# Patient Record
Sex: Male | Born: 1945
Health system: Southern US, Community
[De-identification: ages and names within clinical notes are randomized; demographics above are authoritative.]

## PROBLEM LIST (undated history)

## (undated) DIAGNOSIS — E079 Disorder of thyroid, unspecified: Secondary | ICD-10-CM

## (undated) DIAGNOSIS — T8859XA Other complications of anesthesia, initial encounter: Secondary | ICD-10-CM

## (undated) DIAGNOSIS — N400 Enlarged prostate without lower urinary tract symptoms: Secondary | ICD-10-CM

## (undated) DIAGNOSIS — T4145XA Adverse effect of unspecified anesthetic, initial encounter: Secondary | ICD-10-CM

## (undated) DIAGNOSIS — I2510 Atherosclerotic heart disease of native coronary artery without angina pectoris: Secondary | ICD-10-CM

## (undated) DIAGNOSIS — M1990 Unspecified osteoarthritis, unspecified site: Secondary | ICD-10-CM

## (undated) DIAGNOSIS — I10 Essential (primary) hypertension: Secondary | ICD-10-CM

## (undated) DIAGNOSIS — N529 Male erectile dysfunction, unspecified: Secondary | ICD-10-CM

## (undated) DIAGNOSIS — E039 Hypothyroidism, unspecified: Secondary | ICD-10-CM

## (undated) DIAGNOSIS — I493 Ventricular premature depolarization: Secondary | ICD-10-CM

## (undated) DIAGNOSIS — E785 Hyperlipidemia, unspecified: Secondary | ICD-10-CM

## (undated) HISTORY — DX: Atherosclerotic heart disease of native coronary artery without angina pectoris: I25.10

## (undated) HISTORY — DX: Male erectile dysfunction, unspecified: N52.9

## (undated) HISTORY — DX: Benign prostatic hyperplasia without lower urinary tract symptoms: N40.0

## (undated) HISTORY — PX: WISDOM TOOTH EXTRACTION: SHX21

## (undated) HISTORY — DX: Hyperlipidemia, unspecified: E78.5

## (undated) HISTORY — DX: Essential (primary) hypertension: I10

## (undated) HISTORY — PX: COLONOSCOPY: SHX174

## (undated) HISTORY — PX: KNEE ARTHROSCOPY: SUR90

## (undated) HISTORY — DX: Disorder of thyroid, unspecified: E07.9

## (undated) HISTORY — DX: Ventricular premature depolarization: I49.3

## (undated) HISTORY — DX: Unspecified osteoarthritis, unspecified site: M19.90

---

## 1983-01-02 HISTORY — PX: OTHER SURGICAL HISTORY: SHX169

## 1997-01-01 HISTORY — PX: NECK SURGERY: SHX720

## 1997-10-29 ENCOUNTER — Encounter: Payer: Self-pay | Admitting: Endocrinology

## 1997-10-29 ENCOUNTER — Ambulatory Visit (HOSPITAL_COMMUNITY): Admission: RE | Admit: 1997-10-29 | Discharge: 1997-10-29 | Payer: Self-pay | Admitting: Endocrinology

## 1997-12-15 ENCOUNTER — Ambulatory Visit (HOSPITAL_COMMUNITY): Admission: RE | Admit: 1997-12-15 | Discharge: 1997-12-15 | Payer: Self-pay | Admitting: Neurosurgery

## 1998-06-09 ENCOUNTER — Inpatient Hospital Stay (HOSPITAL_COMMUNITY): Admission: RE | Admit: 1998-06-09 | Discharge: 1998-06-11 | Payer: Self-pay | Admitting: Neurosurgery

## 1998-10-31 ENCOUNTER — Encounter: Admission: RE | Admit: 1998-10-31 | Discharge: 1998-10-31 | Payer: Self-pay | Admitting: Neurosurgery

## 1999-01-31 ENCOUNTER — Encounter: Admission: RE | Admit: 1999-01-31 | Discharge: 1999-01-31 | Payer: Self-pay | Admitting: Neurosurgery

## 1999-08-08 ENCOUNTER — Encounter: Admission: RE | Admit: 1999-08-08 | Discharge: 1999-08-08 | Payer: Self-pay | Admitting: Neurosurgery

## 2003-01-02 HISTORY — PX: REPLACEMENT TOTAL KNEE: SUR1224

## 2003-01-18 ENCOUNTER — Inpatient Hospital Stay (HOSPITAL_COMMUNITY): Admission: RE | Admit: 2003-01-18 | Discharge: 2003-01-21 | Payer: Self-pay | Admitting: Orthopedic Surgery

## 2006-05-09 ENCOUNTER — Emergency Department (HOSPITAL_COMMUNITY): Admission: EM | Admit: 2006-05-09 | Discharge: 2006-05-09 | Payer: Self-pay | Admitting: Emergency Medicine

## 2008-08-26 ENCOUNTER — Inpatient Hospital Stay (HOSPITAL_COMMUNITY): Admission: RE | Admit: 2008-08-26 | Discharge: 2008-08-28 | Payer: Self-pay | Admitting: Neurosurgery

## 2009-05-04 ENCOUNTER — Encounter: Admission: RE | Admit: 2009-05-04 | Discharge: 2009-05-04 | Payer: Self-pay | Admitting: Gastroenterology

## 2010-04-08 LAB — BASIC METABOLIC PANEL
CO2: 27 mEq/L (ref 19–32)
Calcium: 9.6 mg/dL (ref 8.4–10.5)
Creatinine, Ser: 1.34 mg/dL (ref 0.4–1.5)
GFR calc non Af Amer: 54 mL/min — ABNORMAL LOW (ref >60)
Potassium: 4.4 mEq/L (ref 3.5–5.1)
Sodium: 138 mEq/L (ref 135–145)

## 2010-04-08 LAB — CBC
HCT: 43.4 % (ref 39.0–52.0)
Hemoglobin: 14.9 g/dL (ref 13.0–17.0)
Platelets: 206 10*3/uL (ref 150–400)
RDW: 13.6 % (ref 11.5–15.5)

## 2010-05-16 NOTE — Op Note (Signed)
NAME:  Logan Sanders, Logan Sanders NO.:  [id number]   MEDICAL RECORD NO.:  [id number]          PATIENT TYPE:  INP   LOCATION:  3534                         FACILITY:  [facility]   PHYSICIAN:  Cristi Loron, M.D.DATE OF BIRTH:  03/26/1945   DATE OF PROCEDURE:  08/26/2008  DATE OF DISCHARGE:                               OPERATIVE REPORT   BRIEF HISTORY:  The patient is a 65 year old white male who I performed  a C3-4, C4-5, C5-6 anterior cervical discectomy a few years ago.  The  patient has developed a C4-5 pseudoarthrosis, and persistent and  increasing neck pain.  I discussed the various treatment options with  the patient to include surgery.  He has weighed the risks, benefits, and  alternatives of surgery, and decided to proceed with a C3-C6 posterior  cervical instrumentation and fusion.   PREOPERATIVE DIAGNOSIS:  C4-5 pseudoarthrosis.   POSTOPERATIVE DIAGNOSIS:  C4-5 pseudoarthrosis.   PROCEDURE:  C3-4, C4-5, and C5-6 posterior cervical fusion with local  morselized autograft bone, bone morphogenic protein, and Actifuse bone  graft extender; C3-C6 posterior cervical instrumentation with axis  lateral mass titanium screws and rods.   SURGEON:  Cristi Loron, MD   ASSISTANT:  Hilda Lias, MD   ANESTHESIA:  General endotracheal.   ESTIMATED BLOOD LOSS:  75 mL.   SPECIMENS:  None.   DRAINS:  None.   COMPLICATIONS:  None.   DESCRIPTION OF PROCEDURE:  The patient was brought to the operating room  by Anesthesia team.  General endotracheal Anesthesia was induced.  The  [place] three-point headrest was applied to the patient's calvarium.  The patient was then turned to the prone position on the chest rolls.  We confirmed his neutral alignment with fluoroscopy.  The patient's  suboccipital region and posterior neck was then shaved with clippers and  prepared with Betadine scrub and Betadine solution.  Sterile drapes were  applied and the injected area to  be incised with Marcaine with  epinephrine solution.  I used a scalpel to make a linear midline  incision over the posterior cervical region.  I used electrocautery to  perform a bilateral subperiosteal dissection exposing spinous process of  the lamina from C3 down to approximately T1.  We inserted the [facility]  and cerebellar retractor for exposure.  We exposed the facets at C3-4,  C4-5, C5-6 and identified the cephalad and caudal, medial, and lateral  borders.  We used a drill and drill guide to drill a 40mm hole beginning  approximately at the center of the lateral mass aiming in the cephalad  direction (under fluoroscopic guidance) for the screws from C3 and C4.  We then probed inside the drill hole to rule out cortical breaches.  We  then inserted a 3.5- x 40-mm screws on the right C3, C4, C5 and C6 and  on the left C4, C5, and C6. We placed a 4.0 x 14 mm on the left at C3.  We got a good bony purchase at each level.  We then cut a rod to the  appropriate length, bend to appropriate lordosis, and then  connected  unilateral screws with a rod.  We placed caps, tightened appropriately.  This completed the instrumentation.   We now turned our attention to the arthrodesis.  We used high-speed  drill to decorticate the lateral aspect of the lamina as well as the  remainder of the facets, lateral masses from C3-C6.  We then laid a bone  morphogenic protein-soaked collagen sponges over these decorticated  posterolateral structures and then laid local autograft bone.  We  obtained with drilling as well as Actifuse bone graft extenders over  these to decorticate posterolateral masses to complete the post  arthrodesis from C3-C6.  Of note, I should mention that clearly the  patient had pseudarthrosis at C4-5 at the motion segment and facet was  quite mobile and arthritic.  We then obtained hemostasis using bipolar  electrocautery.  We then removed the retractors and reapproximated the   patient's cervicothoracic fascia with interrupted 0 Vicryl suture and  subcutaneous tissue with interrupted 2-0 Vicryl suture and the skin with  Steri-Strips and Benzoin.  The wound was then coated with bacitracin  ointment.  Sterile dressing was applied.  The drapes were removed and  the patient was subsequently returned to supine position where the  [place] three-point headrest was removed and the patient was  subsequently extubated by the Anesthesia team and transported to Post  Anesthesia Care Unit in stable condition.  All sponge, instrument, and  needle counts were correct at the end of this case.      Cristi Loron, M.D.  Electronically Signed     JDJ/MEDQ  D:  08/26/2008  T:  08/27/2008  Job:  [id number]

## 2010-05-19 NOTE — H&P (Signed)
NAME:  Logan Sanders, Logan Sanders NO.:  [id number]   MEDICAL RECORD NO.:  [id number]                   PATIENT TYPE:  INP   LOCATION:                                       FACILITY:  [facility]   PHYSICIAN:  John L. Rendall, M.D.               DATE OF BIRTH:  11-12-1945   DATE OF ADMISSION:  01/18/2003  DATE OF DISCHARGE:                                HISTORY & PHYSICAL   CHIEF COMPLAINT:  Left knee pain.   HISTORY OF PRESENT ILLNESS:  Mr. Allemand is a 65 year old white male with a  long history of left knee pain. Initially he injured his knee during a  football game in college, the year was 54. The patient has had three  surgeries on his knee with good relief of his pain with each surgery. The  last surgery was performed by Dr. Priscille Kluver in 1994 for torn medial meniscus,  however, the patient has had progressively worsening knee pain since that  time. He has undergone multiple cortisone and even underwent a series of  [place] injections with decreasing duration of pain with each injection.  The pain is mostly with standing or ambulation. The pain does not awaken him  at night. Mechanical symptoms include a giving way sensation, however, he  has not sustained any falls. He also has loss of sensation. The patient  takes Vicodin for pain, especially at night, with fair relief as long as he  uses the Vicodin extra strength. The patient uses assistive devices to  ambulate. He will undergo a left total knee arthroplasty on November 17 at  [facility] performed by Dr. Priscille Kluver.   ALLERGIES:  No known drug allergies.   MEDICATIONS:  1. Synthroid 75 mcg daily.  2. Lipitor 10 mg daily.  3. Doxazosin 2 mg daily.  4. Atenolol 50 mg daily.   PAST MEDICAL HISTORY:  1. Bilateral osteoarthritis of the knees left greater than right.  2. Hypertension.  3. Hyperlipidemia.   PAST SURGICAL HISTORY:  1. Left knee surgery in 1969.  2. Lower back surgery in 1983.  3.  Left knee arthroscopy 1986.  4. Left knee arthroscopy by Dr. Priscille Kluver in 1983.  5. Three level fusion of the cervical spine in 2000.   The patient denies blood transfusions with any of the above procedures. He  does mention he has a difficult time waking up from general anesthesia. He  feels as if he is trapped.   SOCIAL HISTORY:  The patient denies any tobacco or alcohol use. He is  married and has two children. The patient lives in a one story home with two  steps to the usual entrance. He is a retired Theatre stage manager.   PRIMARY CARE PHYSICIAN:  Alfonse Alpers. Dagoberto Ligas, M.D., [place], [place]  [place].   FAMILY HISTORY:  The patient's mother is alive at age 20, has asthma and  Parkinson's disease. His  father is alive at age 3, has questionable  hypertension and a history of prostate cancer. Two living siblings with no  known medical conditions.   REVIEW OF SYSTEMS:  The patient denies any chest pain, shortness of breath,  PND, orthopnea. He wears glasses. Has urinary urgency. Otherwise denies any  GI, GU, neurologic, hematologic conditions.   PHYSICAL EXAMINATION:  GENERAL:  The patient is a well-developed, well-  nourished male who walks with a slight limp on the left side. The patient's  mood and affect are appropriate. The patient talks easily with examiner.  VITAL SIGNS:  Height 5 feet 10 inches, weight 220 pounds. Temperature 97.3,  blood pressure 130/80, pulse 60, respirations 16.  HEENT:  Head is normocephalic and atraumatic without frontal or maxillary  sinus tenderness to palpation. Bilateral conjunctivae are pink. Sclerae is  nonicteric bilaterally. PERRLA. EOMs are intact. No visible external ear  deformity noted. TMs are pearly and gray bilaterally. Nose and nasal septum  midline. Nasal mucosa is pink and moist without polyps. Buccal mucosa was  pink and moist. Good dentition. Pharynx is without erythema or exudate.  Tongue and uvula are midline.  NECK:  Supple. Carotids  are 2+ without bruits. Has full range of motion of  the cervical spine with no tenderness with palpation along the cervical  spine.  CARDIAC:  Regular rate and rhythm. No murmurs, rubs, or gallops noted.  ABDOMEN:  Soft, nontender, positive bowel sounds x4 quadrants.  BACK:  Nontender to palpation over the thoracic and lumbar spine.  GENITOURINARY/RECTAL:  Not pertinent at this time.  NEUROLOGIC:  The patient is alert and oriented x3. Cranial nerves 2-12 are  grossly intact. Upper and lower extremity strength testing reveals 5/5  strength bilaterally. Deep tendon reflexes are 2+ bilaterally in the upper  and lower extremities.  MUSCULOSKELETAL:  Upper extremities:  The patient had full range of motion  of the upper extremities. Upper extremities are neuromotorly and vascularly  intact. Radial pulses are 2+ bilaterally. Lower extremities:  Hips full  range of motion bilaterally. Straight leg raise is negative bilaterally.  Right knee full range of motion. __________ reveals no laxity. Otherwise  negative. Passive range of motion revealed some minimal crepitus. Nontender  about the joint line. Left knee:  0 to 115-120 degrees. Forced flexion  causes pain. Positive crepitus with passive range of motion. Lateral  compartment is tender to palpation. Well-healed medial scar on the knee and  a surgical scar is well-healed above the superior pole. Lower extremities  are nonedematous. The dorsal pedal pulses are 2+ bilaterally.   X-RAYS:  X-rays of the left knee show end-stage osteoarthritis of the left  knee. The right knee shows early medial compartment narrowing.   IMPRESSION:  1. End-stage osteoarthritis of the left knee.  2. Moderate osteoarthritis of the right knee.  3. Hypertension.  4. Hyperlipidemia.   PLAN:  The patient is to be admitted to [facility] on November 16, 2002 and undergo a left total knee arthroplasty by Dr. Priscille Kluver at [facility].      Richardean Canal, [name]                       John L. Priscille Kluver, M.D.    Leanora Cover  D:  01/08/2003  T:  01/08/2003  Job:  [id number]

## 2010-05-19 NOTE — Op Note (Signed)
NAME:  Logan Sanders, Logan Sanders                         ACCOUNT NO.:  [id number]   MEDICAL RECORD NO.:  [id number]                   PATIENT TYPE:  INP   LOCATION:  2550                                 FACILITY:  [facility]   PHYSICIAN:  John L. Rendall III, M.D.           DATE OF BIRTH:  [birth year]   DATE OF PROCEDURE:  01/18/2003  DATE OF DISCHARGE:                                 OPERATIVE REPORT   INDICATIONS FOR PROCEDURE:  Chronic medial left knee pain, resistant to  conservative measures for the last several years.   JUSTIFICATION FOR INPATIENT SETTING:  Pain control and therapy.   PREOPERATIVE DIAGNOSIS:  End-stage osteoarthritis, left knee.   POSTOPERATIVE DIAGNOSIS:  End-stage osteoarthritis, left knee.   PROCEDURE:  Left LCS total knee replacement.   SURGEON:  John L. Rendall, M.D.   ASSISTANT:  Legrand Pitts. Duffy, [name]-C.   ANESTHESIA:  General.   PATHOLOGY:  Bone against bone, medial compartment and lateral compartment,  left knee.   DESCRIPTION OF PROCEDURE:  Under general anesthesia, the left knee is  prepared with DuraPrep and draped as a sterile field.  A sterile proximal  thigh tourniquet is applied and wrapped out with an Esmarch, and elevated at  350 mmHg.  A curvilinear incision is made, incorporating a remote medial  parapatellar incision.  This scar is surgically excised.  A medial deep  incision is made, medial parapatellar.  The patella is everted.  The femur  is sized at a large using the tibial guide.  A proximal tibial resection is  carried out.  The cruciates are not spared.  Using the first femoral guide,  an inner condylar drill hole is placed.  Using the second guide, anterior  and posterior flare of the distal femur resected with a balanced 10 mm  flexion gap.  It was necessary to release some of the MCL from the tibia to  balance it medially.  The distal femoral cut device was not immediately  available, and at this point the proximal tibia was prepared with  center peg  hole with pins placed.  The patella was osteotomized and three peg holes  placed, and the remnants of the menisci and cruciates were resected.  At  this point the cut for the distal femur was available, and a distal femoral  cut was made with a balanced 10 mm extension gap.  The recessing guide is  then used.  A trial component of all components then reveals excellent fit  and alignment, a #5 tibia, a 10 mm bearing, and large femur, with a large  patella.  The permanent components were then obtained.  The bony surfaces  are prepared with pulse irrigation.  All components were then cemented in  place.  Once the cement has hardened, the tourniquet is let down at  precisely one hour.  Multiple small vessels are cauterized.  Excess cement  is removed.  A  Hemovac  is inserted.  The knee is then closed in layers with #1 Tycron, #0  and #2-0 Vicryl, and skin clips are applied.  The patient returned to the recovery room in good condition.  A femoral  nerve block was given prior to the case.                                               John L. Dorothyann Gibbs, M.D.    Renato Gails  D:  01/18/2003  T:  01/18/2003  Job:  [id number]

## 2010-05-19 NOTE — Discharge Summary (Signed)
NAME:  Logan Sanders, Logan Sanders                         ACCOUNT NO.:  [id number]   MEDICAL RECORD NO.:  [id number]                   PATIENT TYPE:  INP   LOCATION:  5005                                 FACILITY:  [facility]   PHYSICIAN:  John L. Rendall, M.D.               DATE OF BIRTH:  08/19/1945   DATE OF ADMISSION:  01/18/2003  DATE OF DISCHARGE:  01/21/2003                                 DISCHARGE SUMMARY   ADMISSION DIAGNOSES:  1. End-stage osteoarthritis, left knee.  2. Osteoarthritis, right knee.  3. Hypertension.  4. Hyperlipidemia.   DISCHARGE DIAGNOSES:  1. End-stage osteoarthritis, left knee, status post total knee arthroplasty.  2. Acute blood loss anemia secondary to surgery.  3. Constipation.  4. Osteoarthritis, right knee.  5. Hypertension.  6. Hyperlipidemia.   SURGICAL PROCEDURE:  On January 18, 2003, Logan Sanders underwent a left total  knee arthroplasty by Dr. Jonny Ruiz L. Rendall, assisted by Legrand Pitts. Duffy, [name]-  C.  He had a Depuy tibial tray cemented size 5 with an LCS complete primary  femoral component cemented size large left.  An LCS complete metal backed  patella cemented size large and an LCS complete RP insert size large 10 mm  thickness.   COMPLICATIONS:  None.   CONSULTATIONS:  1. Physical therapy consult January 19, 2003.  2. Occupational therapy consult January 20, 2003.   HISTORY OF PRESENT ILLNESS:  This 65 year old white male patient presented  to Dr. Priscille Kluver with a long history of left knee pain.  He injured his knee  initially while in college.  He has had three surgeries in the past on the  knee and has had good relief until recently.  Since 1994, he has had  progressively worsening left knee pain.  The pain is present with standing  or ambulation.  It does not awaken him at night, but he complains of the  knee giving way at times.  He is requiring Vicodin for pain relief.  He has  failed conservative treatment.  Because of that, he is presenting  for a left  knee replacement.   HOSPITAL COURSE:  Logan Sanders tolerated the surgical procedure well without  immediate postoperative complications.  He was transferred to 5000.  On  postop day #1, he did have some complaints of itching but otherwise was  doing well.  T-max 100.2.  Hemoglobin 11.9, hematocrit 34.1.  Leg was  neurovascularly intact.  He was started on therapy per protocol.   He did well over the next several days.  Pain was well controlled with p.o.  medications.  He progressed quickly with therapy and on January 21, 2003,  was ready for discharge home.   DISCHARGE INSTRUCTIONS:   DIET:  He may resume his pre-hospitalization diet.   DISCHARGE MEDICATIONS:  He may resume his pre-hospitalization medications.  These include:  1. Synthroid 75 mcg p.o. q.a.m.  2. Lipitor 10 mg  p.o. q.a.m.  3  Doxazosin 2 mg p.o. q.a.m.  1. Atenolol 50 mg p.o. q.a.m..  Additional medications at this time include:  1. Arixtra 2.5 mg subcutaneously daily at 8 p.m., last dose January 24, 2003.  On January 24, he is to start 1 baby aspirin a day for one month.  1. OxyContin 10 mg 2 tablets p.o. q.12h. for 7 days, then 1 tablet p.o.     q.12h. for 7 days, then discontinue, #42 with no refill.  2. Percocet 5/325 mg 1 to 2 tablet p.o. q.4h. p.r.n. for pain, #50 with no     refill.  3. Robaxin 500 mg 1 to 2 tablets p.o. q.6h. p.r.n. for spasm, #40 with no     refill.  4. Ambien 10 mg 1/2 to 1 tablet p.o. q.h.s. p.r.n. insomnia, #30 with no     refill.   ACTIVITY:  He can be out of bed, weightbearing as tolerated on the left with  use of the walker.  He is arranged for home CPM and home health PT per  [place].  Please see the blue total knee discharge sheet for further  activity instruction.   WOUND CARE:  He may shower after no drainage from the wound for two days.  Please see the blue total knee discharge sheet for further wound care  instructions.   FOLLOW UP:  He is to follow up  with our office in approximately 10 to 12  days and needs to call [phone] for that appointment.   LABORATORY DATA:  On January 19, 2003, hemoglobin 11.9, hematocrit 34.1.  On  January 19, hemoglobin 10.4, hematocrit 30.5.  On January 20, white count  7.4, hemoglobin 10.2, hematocrit 29.6, platelets 176.   On January 18, his glucose was 173, calcium 8.2.  On January 19, glucose  175, calcium 8.1.  All other laboratory studies were  within normal limits.      [name], [name]                      John L. Priscille Kluver, M.D.    KED/MEDQ  D:  03/01/2003  T:  03/01/2003  Job:  [id number]   cc:   Alfonse Alpers. Dagoberto Ligas, M.D.  [address]  [place]  [place] [address]  Fax: [phone]

## 2011-12-04 ENCOUNTER — Other Ambulatory Visit: Payer: Self-pay | Admitting: Physician Assistant

## 2011-12-04 ENCOUNTER — Ambulatory Visit
Admission: RE | Admit: 2011-12-04 | Discharge: 2011-12-04 | Disposition: A | Discharge status: Home / Self Care | Payer: Medicare Other | Source: Ambulatory Visit | Attending: Physician Assistant | Admitting: Physician Assistant

## 2011-12-04 DIAGNOSIS — R52 Pain, unspecified: Secondary | ICD-10-CM

## 2012-06-03 ENCOUNTER — Ambulatory Visit
Admission: RE | Admit: 2012-06-03 | Discharge: 2012-06-03 | Disposition: A | Discharge status: Home / Self Care | Payer: Medicare Other | Source: Ambulatory Visit | Attending: Family Medicine | Admitting: Family Medicine

## 2012-06-03 ENCOUNTER — Other Ambulatory Visit: Payer: Self-pay | Admitting: Family Medicine

## 2012-06-03 DIAGNOSIS — R52 Pain, unspecified: Secondary | ICD-10-CM

## 2012-07-25 ENCOUNTER — Other Ambulatory Visit: Payer: Self-pay | Admitting: Family Medicine

## 2012-07-25 DIAGNOSIS — N644 Mastodynia: Secondary | ICD-10-CM

## 2012-07-31 ENCOUNTER — Ambulatory Visit
Admission: RE | Admit: 2012-07-31 | Discharge: 2012-07-31 | Disposition: A | Discharge status: Home / Self Care | Payer: Medicare Other | Source: Ambulatory Visit | Attending: Family Medicine | Admitting: Family Medicine

## 2012-07-31 DIAGNOSIS — N644 Mastodynia: Secondary | ICD-10-CM

## 2012-11-21 ENCOUNTER — Ambulatory Visit
Admission: RE | Admit: 2012-11-21 | Discharge: 2012-11-21 | Disposition: A | Discharge status: Home / Self Care | Payer: Medicare HMO | Source: Ambulatory Visit | Attending: Family Medicine | Admitting: Family Medicine

## 2012-11-21 ENCOUNTER — Other Ambulatory Visit: Payer: Self-pay | Admitting: Family Medicine

## 2012-11-21 DIAGNOSIS — R109 Unspecified abdominal pain: Secondary | ICD-10-CM

## 2013-05-27 ENCOUNTER — Other Ambulatory Visit: Payer: Self-pay | Admitting: Family Medicine

## 2013-05-27 ENCOUNTER — Ambulatory Visit
Admission: RE | Admit: 2013-05-27 | Discharge: 2013-05-27 | Disposition: A | Discharge status: Home / Self Care | Payer: Medicare HMO | Source: Ambulatory Visit | Attending: Family Medicine | Admitting: Family Medicine

## 2013-05-27 DIAGNOSIS — S6992XA Unspecified injury of left wrist, hand and finger(s), initial encounter: Secondary | ICD-10-CM

## 2013-10-13 ENCOUNTER — Encounter: Payer: Self-pay | Admitting: Cardiovascular Disease

## 2013-10-13 ENCOUNTER — Ambulatory Visit (INDEPENDENT_AMBULATORY_CARE_PROVIDER_SITE_OTHER): Payer: Medicare HMO | Admitting: Cardiovascular Disease

## 2013-10-13 VITALS — BP 118/70 | HR 57 | Ht 70.0 in | Wt 207.8 lb

## 2013-10-13 DIAGNOSIS — I10 Essential (primary) hypertension: Secondary | ICD-10-CM

## 2013-10-13 DIAGNOSIS — E785 Hyperlipidemia, unspecified: Secondary | ICD-10-CM

## 2013-10-13 NOTE — Assessment & Plan Note (Signed)
Lipids are mildly elevated  - on   A low dose lipitor 10 mg QOD - he had elevated liver enzymes  On daily atorvastatin - dose was reduced.  At this point, I do not think that a stress test is indicated. He has no symptoms and he has a low pre-test probability of having CAD  I have advised him to increase his exercise to 1 hr a day. We will get a coronary calcium score for  Better risk assessment. I will see him in 1 year.

## 2013-10-13 NOTE — Progress Notes (Signed)
Logan Sanders Date of Birth  Jun 21, 1945       [place] [place] [address], [place], [place]  [address]   [place], [place]  [address] [place]   Fax  [phone]     Fax [phone]  Problem List: 1. Hypertension 2. Hyperlipidemia 3. Hypothyroidism  History of Present Illness:  Logan Sanders is a 69 old gentleman with a history of hypertension, hyperlipidemia and a positive family history of cardiac disease. He was referred for possible stress testing given his risk factors.  He has not had any symptoms of CP or dyspnea.  He worked for the [employer] for 33 years - retired 2005.  Currently, he spends his time riding his [place] , plays golf, hunts deer, [place], squirrel, .  Walks about a mile each day.  Does all this without any CP or dyspnea.  Lifts light weights and uses resistance bands.    Grandfather died at age 67, father died at age 33. Uncle died in his 23's    Current Outpatient Prescriptions  Medication Sig Dispense Refill  . aspirin 81 MG tablet Take 81 mg by mouth daily.      Marland Kitchen atorvastatin (LIPITOR) 10 MG tablet Take 10 mg by mouth every other day.       Marland Kitchen co-enzyme Q-10 50 MG capsule Take 100 mg by mouth daily.      Marland Kitchen doxazosin (CARDURA) 2 MG tablet Take 2 mg by mouth daily.       . fexofenadine (ALLEGRA) 180 MG tablet Take 180 mg by mouth daily.      . Fish Oil-Cholecalciferol (FISH OIL + D3) 1000-1000 MG-UNIT CAPS Take 1,200 mg by mouth daily.      . Garlic (GARLIQUE PO) Take by mouth daily.      . multivitamin-iron-minerals-folic acid (CENTRUM) chewable tablet Chew 1 tablet by mouth daily.      Marland Kitchen SYNTHROID 100 MCG tablet Take 100 mcg by mouth daily before breakfast.       . traZODone (DESYREL) 50 MG tablet Take 50 mg by mouth at bedtime.       . valsartan-hydrochlorothiazide (DIOVAN-HCT) 320-25 MG per tablet Take 1 tablet by mouth daily.      Marland Kitchen zolpidem (AMBIEN)  10 MG tablet Take 10 mg by mouth at bedtime.        No current facility-administered medications for this visit.     No Known Allergies  Past Medical History  Diagnosis Date  . Hypertension   . Thyroid disease   . Hyperlipidemia   . BPH (benign prostatic hyperplasia)   . ED (erectile dysfunction)   . Osteoarthritis     Past Surgical History  Procedure Laterality Date  . Replacement total knee  2005    LEFT KNEE  . Neck surgery  1999  . Lower back surgery  1985    History  Smoking status  . Never Smoker   Smokeless tobacco  . Not on file    History  Alcohol Use  . 0.6 oz/week  . 1 Cans of beer per week    Family History  Problem Relation Age of Onset  . Asthma Mother   . Parkinsonism Mother   . Heart disease Father   . Heart attack Father   . Prostate cancer Father   . Hyperlipidemia Brother     Reviw of Systems:  Reviewed  in the HPI.  All other systems are negative.  Physical Exam: Blood pressure 118/70, pulse 57, height 5\' 10"  (1.778 m), weight 207 lb 12.8 oz (94.257 kg). Wt Readings from Last 3 Encounters:  10/13/13 207 lb 12.8 oz (94.257 kg)     General: Well developed, well nourished, in no acute distress.  Head: Normocephalic, atraumatic, sclera non-icteric, mucus membranes are moist,   Neck: Supple. Carotids are 2 + without bruits. No JVD   Lungs: Clear   Heart: RR, normal S1S2  Abdomen: Soft, non-tender, non-distended with normal bowel sounds.  Msk:  Strength and tone are normal   Extremities: No clubbing or cyanosis. No edema.  Distal pedal pulses are 2+ and equal   Neuro: CN II - XII intact.  Alert and oriented X 3.   Psych:  Normal  ECG: Oct. 13, 2015:  Sinus bradycardia at 57.  LAD   Assessment / Plan:

## 2013-10-13 NOTE — Patient Instructions (Signed)
Your physician recommends that you continue on your current medications as directed. Please refer to the Current Medication list given to you today.   DR NAHSER HAS ORDERED FOR YOU TO HAVE A CT CARDIAC SCORE DONE HERE AT OUR OFFICE, FOR SCREENING PURPOSES.    Your physician wants you to follow-up in: [place] will receive a reminder letter in the mail two months in advance. If you don't receive a letter, please call our office to schedule the follow-up appointment.

## 2013-10-13 NOTE — Assessment & Plan Note (Signed)
His BP is well controlled on current medications. Continue .

## 2013-10-27 ENCOUNTER — Ambulatory Visit (INDEPENDENT_AMBULATORY_CARE_PROVIDER_SITE_OTHER)
Admission: RE | Admit: 2013-10-27 | Discharge: 2013-10-27 | Disposition: A | Discharge status: Home / Self Care | Payer: Self-pay | Source: Ambulatory Visit | Attending: Cardiovascular Disease | Admitting: Cardiovascular Disease

## 2013-10-27 DIAGNOSIS — E785 Hyperlipidemia, unspecified: Secondary | ICD-10-CM

## 2013-10-27 DIAGNOSIS — I10 Essential (primary) hypertension: Secondary | ICD-10-CM

## 2013-10-28 ENCOUNTER — Telehealth: Payer: Self-pay | Admitting: Nurse Practitioner

## 2013-10-28 NOTE — Telephone Encounter (Signed)
Per Dr. Acie Fredrickson:  He has coronary calcification in the LM, LAD and LCX distribution. I think a stress myoview would be helpful in assessing his risks. Please schedule  Spoke with patient and reviewed results of CT scan; advised that stress myoview is needed.  I advised patient of instructions from Myocardial Perfusion Study sheet and advised that one of our schedulers would call patient to make appointment.  Patient verbalized understanding and agreement with plan of care

## 2013-10-29 ENCOUNTER — Other Ambulatory Visit: Payer: Self-pay | Admitting: Nurse Practitioner

## 2013-10-29 ENCOUNTER — Telehealth: Payer: Self-pay | Admitting: Cardiovascular Disease

## 2013-10-29 DIAGNOSIS — I25810 Atherosclerosis of coronary artery bypass graft(s) without angina pectoris: Secondary | ICD-10-CM

## 2013-10-29 NOTE — Telephone Encounter (Signed)
New Message      Pt calling stating he was told he needs to have an exercise tolerance test

## 2013-11-02 ENCOUNTER — Ambulatory Visit (HOSPITAL_COMMUNITY): Payer: Medicare HMO | Attending: Cardiovascular Disease | Admitting: Radiology

## 2013-11-02 DIAGNOSIS — I25810 Atherosclerosis of coronary artery bypass graft(s) without angina pectoris: Secondary | ICD-10-CM

## 2013-11-02 DIAGNOSIS — R0609 Other forms of dyspnea: Secondary | ICD-10-CM | POA: Insufficient documentation

## 2013-11-02 DIAGNOSIS — R9430 Abnormal result of cardiovascular function study, unspecified: Secondary | ICD-10-CM

## 2013-11-02 DIAGNOSIS — E785 Hyperlipidemia, unspecified: Secondary | ICD-10-CM | POA: Diagnosis not present

## 2013-11-02 DIAGNOSIS — I10 Essential (primary) hypertension: Secondary | ICD-10-CM | POA: Insufficient documentation

## 2013-11-02 MED ORDER — TECHNETIUM TC 99M SESTAMIBI GENERIC - CARDIOLITE
33.0000 | Freq: Once | INTRAVENOUS | Status: AC | PRN
  Administered 2013-11-02: 33 via INTRAVENOUS

## 2013-11-02 MED ORDER — TECHNETIUM TC 99M SESTAMIBI GENERIC - CARDIOLITE
11.0000 | Freq: Once | INTRAVENOUS | Status: AC | PRN
  Administered 2013-11-02: 11 via INTRAVENOUS

## 2013-11-02 NOTE — Progress Notes (Signed)
[facility] NUCLEAR [facility] [address] [place],  [address] [phone]    Cardiology Nuclear Med Study  Logan Sanders is a 68 y.o. male     MRN : [id number]     DOB: [birth year]  Procedure Date: 11/02/2013  Nuclear Med Background Indication for Stress Test:  Evaluation for Ischemia, and 10-27-2013 Cardiac CT: Coronary Calcification LM, LAD, and LCX History:  No known CAD Cardiac Risk Factors: Hypertension and Lipids  Symptoms:  DOE   Nuclear Pre-Procedure Caffeine/Decaff Intake:  None> 12 hrs NPO After: 6:30pm   Lungs:  clear O2 Sat: 97% on room air. IV 0.9% NS with Angio Cath:  22g  IV Site: R Antecubital x 1, tolerated well IV Started by:  Irven Baltimore, RN  Chest Size (in):  44 Cup Size: n/a  Height: 5\' 10"  (1.778 m)  Weight:  205 lb (92.987 kg)  BMI:  Body mass index is 29.41 kg/(m^2). Tech Comments:  Patient took Diovan-Hct this am. Irven Baltimore, RN    Nuclear Med Study 1 or 2 day study: 1 day  Stress Test Type:  Stress  Reading MD: N/A  Order Authorizing Provider:  Mertie Moores, MD  Resting Radionuclide: Technetium 81m Sestamibi  Resting Radionuclide Dose: 11.0 mCi   Stress Radionuclide:  Technetium 59m Sestamibi  Stress Radionuclide Dose: 33.0 mCi           Stress Protocol Rest HR: 63 Stress HR: 148  Rest BP: 144/82 Stress BP: 199/64  Exercise Time (min): 7:30 METS: 9.3           Dose of Adenosine (mg):  n/a Dose of Lexiscan: n/a mg  Dose of Atropine (mg): n/a Dose of Dobutamine: n/a mcg/kg/min (at max HR)  Stress Test Technologist: Glade Lloyd, BS-ES  Nuclear Technologist:  Earl Many, CNMT     Rest Procedure:  Myocardial perfusion imaging was performed at rest 45 minutes following the intravenous administration of Technetium 7m Sestamibi. Rest ECG: NSR - Normal EKG  Stress Procedure:  The patient exercised on the treadmill utilizing the Bruce Protocol for 7:30 minutes. The patient stopped due to SOB and denied any  chest pain.  Technetium 84m Sestamibi was injected at peak exercise and myocardial perfusion imaging was performed after a brief delay. Stress ECG: No significant change from baseline ECG  QPS Raw Data Images:  Normal; no motion artifact; normal heart/lung ratio. Stress Images:  there is mildly reduced tracer uptake in a moderate sized area of the lateral wall Rest Images:  Normal homogeneous uptake in all areas of the myocardium. Subtraction (SDS):  These findings are consistent with ischemia. Transient Ischemic Dilatation (Normal <1.22):  0.86 Lung/Heart Ratio (Normal <0.45):  0.36  Quantitative Gated Spect Images QGS EDV:  93 ml QGS ESV:  49 ml  Impression Exercise Capacity:  Fair exercise capacity. BP Response:  Hypertensive blood pressure response. Clinical Symptoms:  There is dyspnea. ECG Impression:  No significant ST segment change suggestive of ischemia. Comparison with Prior Nuclear Study: No previous nuclear study performed  Overall Impression:  Intermediate risk stress nuclear study with mild lateral wall ischemia and mildly depressed LV systolic function.  LV Ejection Fraction: 47%.  LV Wall Motion:  NL LV Function; NL Wall Motion   Sanda Klein, MD, [facility] [phone] office [phone] pager

## 2013-11-03 ENCOUNTER — Telehealth: Payer: Self-pay | Admitting: Nurse Practitioner

## 2013-11-03 NOTE — Telephone Encounter (Signed)
Received call from patient and advised him of positive stress test results and need for cardiac cath.  Patient verbalized understanding and would like to see Dr. Acie Fredrickson in person to discuss.  Patient states he cannot go to [place] office today, scheduled for Thursday 11/5 at 7:45a.  Patient verbalized understanding and agreement with plan of care.

## 2013-11-03 NOTE — Telephone Encounter (Signed)
The stress test is positive.     Despite his lack of symptoms, he may need a cath    I have openings in my [place] schedule today    Received above message from Dr. Acie Fredrickson.  Left message for patient to call me at the office.

## 2013-11-05 ENCOUNTER — Encounter: Payer: Self-pay | Admitting: Cardiovascular Disease

## 2013-11-05 ENCOUNTER — Ambulatory Visit (INDEPENDENT_AMBULATORY_CARE_PROVIDER_SITE_OTHER): Payer: Medicare HMO | Admitting: Cardiovascular Disease

## 2013-11-05 ENCOUNTER — Encounter: Payer: Self-pay | Admitting: Nurse Practitioner

## 2013-11-05 VITALS — BP 130/78 | HR 65 | Ht 70.0 in | Wt 209.4 lb

## 2013-11-05 DIAGNOSIS — Z5181 Encounter for therapeutic drug level monitoring: Secondary | ICD-10-CM

## 2013-11-05 DIAGNOSIS — R9439 Abnormal result of other cardiovascular function study: Secondary | ICD-10-CM | POA: Insufficient documentation

## 2013-11-05 LAB — CBC WITH DIFFERENTIAL/PLATELET
BASOS ABS: 0 10*3/uL (ref 0.0–0.1)
Basophils Relative: 0.5 % (ref 0.0–3.0)
Eosinophils Absolute: 0.4 10*3/uL (ref 0.0–0.7)
Eosinophils Relative: 4.6 % (ref 0.0–5.0)
HEMATOCRIT: 44.1 % (ref 39.0–52.0)
Hemoglobin: 14.5 g/dL (ref 13.0–17.0)
Lymphocytes Relative: 19.5 % (ref 12.0–46.0)
Lymphs Abs: 1.6 10*3/uL (ref 0.7–4.0)
MCHC: 32.9 g/dL (ref 30.0–36.0)
MCV: 87.4 fl (ref 78.0–100.0)
MONO ABS: 0.8 10*3/uL (ref 0.1–1.0)
MONOS PCT: 10.2 % (ref 3.0–12.0)
Neutro Abs: 5.4 10*3/uL (ref 1.4–7.7)
Neutrophils Relative %: 65.2 % (ref 43.0–77.0)
PLATELETS: 234 10*3/uL (ref 150.0–400.0)
RBC: 5.05 Mil/uL (ref 4.22–5.81)
RDW: 14.8 % (ref 11.5–15.5)
WBC: 8.2 10*3/uL (ref 4.0–10.5)

## 2013-11-05 LAB — BASIC METABOLIC PANEL
BUN: 23 mg/dL (ref 6–23)
CALCIUM: 9.2 mg/dL (ref 8.4–10.5)
CO2: 25 meq/L (ref 19–32)
Chloride: 104 mEq/L (ref 96–112)
Creatinine, Ser: 1.8 mg/dL — ABNORMAL HIGH (ref 0.4–1.5)
GFR: 39.7 mL/min — AB (ref >60.00)
Glucose, Bld: 92 mg/dL (ref 70–99)
Potassium: 4 mEq/L (ref 3.5–5.1)
Sodium: 138 mEq/L (ref 135–145)

## 2013-11-05 LAB — PROTIME-INR
INR: 1 ratio (ref 0.8–1.0)
PROTHROMBIN TIME: 10.6 s (ref 9.6–13.1)

## 2013-11-05 NOTE — Progress Notes (Signed)
Logan Sanders Date of Birth  09-24-1945       [facility] [address], [place], [place]  [address]   [place], [place]  [address] [place]   Fax  [phone]     Fax [phone]  Problem List: 1. Hypertension 2. Hyperlipidemia 3. Hypothyroidism  History of Present Illness:  Logan Sanders is a 68 old gentleman with a history of hypertension, hyperlipidemia and a positive family history of cardiac disease. He was referred for possible stress testing given his risk factors.  He has not had any symptoms of CP or dyspnea.  He worked for the [employer] for 33 years - retired 2005.  Currently, he spends his time riding his Selinda Eon , plays golf, hunts deer, [place], squirrel, .  Walks about a mile each day.  Does all this without any CP or dyspnea.  Lifts light weights and uses resistance bands.    Grandfather died at age 87, father died at age 42. Uncle died in his 41's   18-Nov-2013:  Logan Sanders presents today for followup. We performed a coronary calcium score which revealed significant calcification: Dense calcification distal LM Scattered calcification of mid and distal LAD and mid and distal RCA  Subsequent stress myoview was abnormal Overall Impression: Intermediate risk stress nuclear study with mild lateral wall ischemia and mildly depressed LV systolic function.  LV Ejection Fraction: 47%. LV Wall Motion: NL LV Function; NL Wall Motion  Current Outpatient Prescriptions  Medication Sig Dispense Refill  . aspirin 81 MG tablet Take 81 mg by mouth daily.    Marland Kitchen atorvastatin (LIPITOR) 10 MG tablet Take 10 mg by mouth every other day.     Marland Kitchen co-enzyme Q-10 50 MG capsule Take 100 mg by mouth daily.    Marland Kitchen doxazosin (CARDURA) 2 MG tablet Take 2 mg by mouth daily.     . fexofenadine (ALLEGRA) 180 MG tablet Take 180 mg by mouth daily.    . Fish Oil-Cholecalciferol (FISH OIL + D3)  1000-1000 MG-UNIT CAPS Take 1,200 mg by mouth daily.    . Garlic (GARLIQUE PO) Take by mouth daily.    . multivitamin-iron-minerals-folic acid (CENTRUM) chewable tablet Chew 1 tablet by mouth daily.    Marland Kitchen SYNTHROID 100 MCG tablet Take 100 mcg by mouth daily before breakfast.     . traZODone (DESYREL) 50 MG tablet Take 50 mg by mouth at bedtime.     . valsartan-hydrochlorothiazide (DIOVAN-HCT) 320-25 MG per tablet Take 1 tablet by mouth daily.    Marland Kitchen zolpidem (AMBIEN) 10 MG tablet Take 10 mg by mouth at bedtime.      No current facility-administered medications for this visit.     No Known Allergies  Past Medical History  Diagnosis Date  . Hypertension   . Thyroid disease   . Hyperlipidemia   . BPH (benign prostatic hyperplasia)   . ED (erectile dysfunction)   . Osteoarthritis     Past Surgical History  Procedure Laterality Date  . Replacement total knee  2005    LEFT KNEE  . Neck surgery  1999  . Lower back surgery  1985    History  Smoking status  . Former Smoker  . Quit date: 05/06/1999  Smokeless tobacco  . Not on file    History  Alcohol Use  . 0.6 oz/week  . 1 Cans of beer per  week    Family History  Problem Relation Age of Onset  . Asthma Mother   . Parkinsonism Mother   . Heart disease Father   . Heart attack Father   . Prostate cancer Father   . Hyperlipidemia Brother     Reviw of Systems:  Reviewed in the HPI.  All other systems are negative.  Physical Exam: Blood pressure 130/78, pulse 65, height 5\' 10"  (1.778 m), weight 209 lb 6.4 oz (94.983 kg). Wt Readings from Last 3 Encounters:  11/05/13 209 lb 6.4 oz (94.983 kg)  11/02/13 205 lb (92.987 kg)  10/13/13 207 lb 12.8 oz (94.257 kg)     General: Well developed, well nourished, in no acute distress.  Head: Normocephalic, atraumatic, sclera non-icteric, mucus membranes are moist,   Neck: Supple. Carotids are 2 + without bruits. No JVD   Lungs: Clear   Heart: RR, normal S1S2  Abdomen:  Soft, non-tender, non-distended with normal bowel sounds.  Msk:  Strength and tone are normal   Extremities: No clubbing or cyanosis. No edema.  Distal pedal pulses are 2+ and equal   Neuro: CN II - XII intact.  Alert and oriented X 3.   Psych:  Normal  ECG: Oct. 13, 2015:  Sinus bradycardia at 57.  LAD   Assessment / Plan:

## 2013-11-05 NOTE — Assessment & Plan Note (Signed)
Logan Sanders has several risk factors for coronary artery disease including hypertension and hyperlipidemia. He has a family history of coronary artery disease. Performed a coronary calcium score which revealed fairly significant calcifications of his left main and LAD distribution. We performed a stress Myoview study which revealed lateral ischemia and a mildly reduced  LV function with an ejection fraction of 47%.  He does not have any symptoms. Does have some atypical episodes of chest twinges.  We had long discussion with the patient. I do think that we should proceed with a diagnostic cardiac catheterization to make of that he doesn't have critical left main and LAD disease. If he hasn't moderate disease and I think we should continue with medical therapy. I told him that would not necessarily think that he would get an angioplasty or stent following this procedure.  Has hyperlipidemia and has been on Lipitor every other day. We'll have him take his atorvastatin 10 mg every day.  We have discussed the risks , benefits  , options.  He understands  And agrees to proceed.

## 2013-11-05 NOTE — Patient Instructions (Addendum)
Your physician has requested that you have a cardiac catheterization. Cardiac catheterization is used to diagnose and/or treat various heart conditions. Doctors may recommend this procedure for a number of different reasons. The most common reason is to evaluate chest pain. Chest pain can be a symptom of coronary artery disease (CAD), and cardiac catheterization can show whether plaque is narrowing or blocking your heart's arteries. This procedure is also used to evaluate the valves, as well as measure the blood flow and oxygen levels in different parts of your heart. For further information please visit [email or web address]. Please follow instruction sheet, as given.  Your physician has recommended you make the following change in your medication:  TAKE Atorvastatin 10 mg EVERY day  Your physician recommends that you schedule a follow-up appointment in: 2-3 months with Dr. Acie Fredrickson.

## 2013-11-06 ENCOUNTER — Telehealth: Payer: Self-pay | Admitting: *Deleted

## 2013-11-06 DIAGNOSIS — R7989 Other specified abnormal findings of blood chemistry: Secondary | ICD-10-CM

## 2013-11-06 NOTE — Telephone Encounter (Signed)
I spoke with pt & he is aware of his lab results & will hydrate over the weekend. He will not take his Diovan/HCTZ on Sunday.  Orders for repeat bmp placed for the hospital. Horton Chin RN

## 2013-11-06 NOTE — Telephone Encounter (Signed)
-----   Message from Thayer Headings, MD sent at 11/06/2013  2:48 PM EST ----- Creatinine is 1.8 - he is on Diovan / HCTZ He needs to drink lots of fluids over the week.  hold Diovan / HCTZ on Sunday . Will need to recheck BMP on Monday AM prior to cath

## 2013-11-09 ENCOUNTER — Encounter (HOSPITAL_COMMUNITY)
Admission: RE | Disposition: A | Discharge status: Home / Self Care | Payer: Self-pay | Source: Ambulatory Visit | Attending: Cardiovascular Disease

## 2013-11-09 ENCOUNTER — Telehealth: Payer: Self-pay | Admitting: Cardiovascular Disease

## 2013-11-09 ENCOUNTER — Ambulatory Visit (HOSPITAL_COMMUNITY)
Admission: RE | Admit: 2013-11-09 | Discharge: 2013-11-09 | Disposition: A | Discharge status: Home / Self Care | Payer: Medicare HMO | Source: Ambulatory Visit | Attending: Cardiovascular Disease | Admitting: Cardiovascular Disease

## 2013-11-09 DIAGNOSIS — Z8249 Family history of ischemic heart disease and other diseases of the circulatory system: Secondary | ICD-10-CM | POA: Diagnosis not present

## 2013-11-09 DIAGNOSIS — E785 Hyperlipidemia, unspecified: Secondary | ICD-10-CM | POA: Diagnosis not present

## 2013-11-09 DIAGNOSIS — I2510 Atherosclerotic heart disease of native coronary artery without angina pectoris: Secondary | ICD-10-CM

## 2013-11-09 DIAGNOSIS — I2584 Coronary atherosclerosis due to calcified coronary lesion: Secondary | ICD-10-CM | POA: Insufficient documentation

## 2013-11-09 DIAGNOSIS — I10 Essential (primary) hypertension: Secondary | ICD-10-CM | POA: Insufficient documentation

## 2013-11-09 DIAGNOSIS — E039 Hypothyroidism, unspecified: Secondary | ICD-10-CM | POA: Insufficient documentation

## 2013-11-09 DIAGNOSIS — Z87891 Personal history of nicotine dependence: Secondary | ICD-10-CM | POA: Insufficient documentation

## 2013-11-09 DIAGNOSIS — R9439 Abnormal result of other cardiovascular function study: Secondary | ICD-10-CM | POA: Diagnosis present

## 2013-11-09 HISTORY — PX: LEFT HEART CATHETERIZATION WITH CORONARY ANGIOGRAM: SHX5451

## 2013-11-09 HISTORY — PX: CARDIAC CATHETERIZATION: SHX172

## 2013-11-09 LAB — BASIC METABOLIC PANEL
Anion gap: 11 (ref 5–15)
BUN: 21 mg/dL (ref 6–23)
CALCIUM: 9.4 mg/dL (ref 8.4–10.5)
CO2: 26 meq/L (ref 19–32)
Chloride: 104 mEq/L (ref 96–112)
Creatinine, Ser: 1.39 mg/dL — ABNORMAL HIGH (ref 0.50–1.35)
GFR calc Af Amer: 59 mL/min — ABNORMAL LOW (ref >90)
GFR calc non Af Amer: 51 mL/min — ABNORMAL LOW (ref >90)
GLUCOSE: 104 mg/dL — AB (ref 70–99)
Potassium: 4.4 mEq/L (ref 3.7–5.3)
SODIUM: 141 meq/L (ref 137–147)

## 2013-11-09 SURGERY — LEFT HEART CATHETERIZATION WITH CORONARY ANGIOGRAM
Anesthesia: LOCAL

## 2013-11-09 MED ORDER — HEPARIN SODIUM (PORCINE) 1000 UNIT/ML IJ SOLN
INTRAMUSCULAR | Status: AC
  Filled 2013-11-09: qty 1

## 2013-11-09 MED ORDER — SODIUM CHLORIDE 0.9 % IJ SOLN: 3.0000 mL | Freq: Two times a day (BID) | INTRAMUSCULAR | Status: DC

## 2013-11-09 MED ORDER — ASPIRIN 81 MG PO CHEW: 81.0000 mg | CHEWABLE_TABLET | ORAL | Status: DC

## 2013-11-09 MED ORDER — ACETAMINOPHEN 325 MG PO TABS: 650.0000 mg | ORAL_TABLET | ORAL | Status: DC | PRN

## 2013-11-09 MED ORDER — SODIUM CHLORIDE 0.9 % IV SOLN
INTRAVENOUS | Status: DC
  Administered 2013-11-09: 10:00:00 via INTRAVENOUS

## 2013-11-09 MED ORDER — ONDANSETRON HCL 4 MG/2ML IJ SOLN: 4.0000 mg | Freq: Four times a day (QID) | INTRAMUSCULAR | Status: DC | PRN

## 2013-11-09 MED ORDER — SODIUM CHLORIDE 0.9 % IJ SOLN
3.0000 mL | INTRAMUSCULAR | Status: DC | PRN
Start: 2013-11-09 — End: 2013-11-09

## 2013-11-09 MED ORDER — NITROGLYCERIN 1 MG/10 ML FOR IR/CATH LAB
INTRA_ARTERIAL | Status: AC
  Filled 2013-11-09: qty 10

## 2013-11-09 MED ORDER — SODIUM CHLORIDE 0.9 % IV SOLN
250.0000 mL | INTRAVENOUS | Status: DC | PRN
Start: 2013-11-09 — End: 2013-11-09

## 2013-11-09 MED ORDER — LIDOCAINE HCL (PF) 1 % IJ SOLN
INTRAMUSCULAR | Status: AC
  Filled 2013-11-09: qty 30

## 2013-11-09 MED ORDER — VERAPAMIL HCL 2.5 MG/ML IV SOLN
INTRAVENOUS | Status: AC
  Filled 2013-11-09: qty 2

## 2013-11-09 MED ORDER — SODIUM CHLORIDE 0.9 % IV SOLN: 1.0000 mL/kg/h | INTRAVENOUS | Status: DC

## 2013-11-09 MED ORDER — MIDAZOLAM HCL 2 MG/2ML IJ SOLN
INTRAMUSCULAR | Status: AC
  Filled 2013-11-09: qty 2

## 2013-11-09 MED ORDER — HEPARIN (PORCINE) IN NACL 2-0.9 UNIT/ML-% IJ SOLN
INTRAMUSCULAR | Status: AC
  Filled 2013-11-09: qty 2000

## 2013-11-09 MED ORDER — FENTANYL CITRATE 0.05 MG/ML IJ SOLN
INTRAMUSCULAR | Status: AC
  Filled 2013-11-09: qty 2

## 2013-11-09 NOTE — Telephone Encounter (Signed)
Attempted to call patient. Got his voicemailbox. Left message to call tomorrow and schedule appt for approx 2 weeks.

## 2013-11-09 NOTE — H&P (View-Only) (Signed)
Logan Sanders Date of Birth  1945/04/05       [facility] [address], [place], [place]  [address]   [place], [place]  [address] [place]   Fax  [phone]     Fax [phone]  Problem List: 1. Hypertension 2. Hyperlipidemia 3. Hypothyroidism  History of Present Illness:  Logan Sanders is a 68 old gentleman with a history of hypertension, hyperlipidemia and a positive family history of cardiac disease. He was referred for possible stress testing given his risk factors.  He has not had any symptoms of CP or dyspnea.  He worked for the [employer] for 33 years - retired 2005.  Currently, he spends his time riding his [place] Eon , plays golf, hunts deer, [place], squirrel, .  Walks about a mile each day.  Does all this without any CP or dyspnea.  Lifts light weights and uses resistance bands.    Grandfather died at age 57, father died at age 62. Uncle died in his 60's   2013-12-03:  Logan Sanders presents today for followup. We performed a coronary calcium score which revealed significant calcification: Dense calcification distal LM Scattered calcification of mid and distal LAD and mid and distal RCA  Subsequent stress myoview was abnormal Overall Impression: Intermediate risk stress nuclear study with mild lateral wall ischemia and mildly depressed LV systolic function.  LV Ejection Fraction: 47%. LV Wall Motion: NL LV Function; NL Wall Motion  Current Outpatient Prescriptions  Medication Sig Dispense Refill  . aspirin 81 MG tablet Take 81 mg by mouth daily.    Marland Kitchen atorvastatin (LIPITOR) 10 MG tablet Take 10 mg by mouth every other day.     Marland Kitchen co-enzyme Q-10 50 MG capsule Take 100 mg by mouth daily.    Marland Kitchen doxazosin (CARDURA) 2 MG tablet Take 2 mg by mouth daily.     . fexofenadine (ALLEGRA) 180 MG tablet Take 180 mg by mouth daily.    . Fish Oil-Cholecalciferol (FISH OIL + D3)  1000-1000 MG-UNIT CAPS Take 1,200 mg by mouth daily.    . Garlic (GARLIQUE PO) Take by mouth daily.    . multivitamin-iron-minerals-folic acid (CENTRUM) chewable tablet Chew 1 tablet by mouth daily.    Marland Kitchen SYNTHROID 100 MCG tablet Take 100 mcg by mouth daily before breakfast.     . traZODone (DESYREL) 50 MG tablet Take 50 mg by mouth at bedtime.     . valsartan-hydrochlorothiazide (DIOVAN-HCT) 320-25 MG per tablet Take 1 tablet by mouth daily.    Marland Kitchen zolpidem (AMBIEN) 10 MG tablet Take 10 mg by mouth at bedtime.      No current facility-administered medications for this visit.     No Known Allergies  Past Medical History  Diagnosis Date  . Hypertension   . Thyroid disease   . Hyperlipidemia   . BPH (benign prostatic hyperplasia)   . ED (erectile dysfunction)   . Osteoarthritis     Past Surgical History  Procedure Laterality Date  . Replacement total knee  2005    LEFT KNEE  . Neck surgery  1999  . Lower back surgery  1985    History  Smoking status  . Former Smoker  . Quit date: 05/06/1999  Smokeless tobacco  . Not on file    History  Alcohol Use  . 0.6 oz/week  . 1 Cans of beer per  week    Family History  Problem Relation Age of Onset  . Asthma Mother   . Parkinsonism Mother   . Heart disease Father   . Heart attack Father   . Prostate cancer Father   . Hyperlipidemia Brother     Reviw of Systems:  Reviewed in the HPI.  All other systems are negative.  Physical Exam: Blood pressure 130/78, pulse 65, height 5\' 10"  (1.778 m), weight 209 lb 6.4 oz (94.983 kg). Wt Readings from Last 3 Encounters:  11/05/13 209 lb 6.4 oz (94.983 kg)  11/02/13 205 lb (92.987 kg)  10/13/13 207 lb 12.8 oz (94.257 kg)     General: Well developed, well nourished, in no acute distress.  Head: Normocephalic, atraumatic, sclera non-icteric, mucus membranes are moist,   Neck: Supple. Carotids are 2 + without bruits. No JVD   Lungs: Clear   Heart: RR, normal S1S2  Abdomen:  Soft, non-tender, non-distended with normal bowel sounds.  Msk:  Strength and tone are normal   Extremities: No clubbing or cyanosis. No edema.  Distal pedal pulses are 2+ and equal   Neuro: CN II - XII intact.  Alert and oriented X 3.   Psych:  Normal  ECG: Oct. 13, 2015:  Sinus bradycardia at 57.  LAD   Assessment / Plan:

## 2013-11-09 NOTE — Discharge Instructions (Signed)

## 2013-11-09 NOTE — CV Procedure (Signed)
    Cardiac Catheterization Procedure Note  Name: Logan Sanders MRN: [id number] DOB: 08-29-1945  Procedure: Left Heart Cath, Selective Coronary Angiography, LV angiography  Indication: abnormal Myoview scan   Procedural Details: The right wrist was prepped, draped, and anesthetized with 1% lidocaine. Using the modified Seldinger technique, a 5/6 French Slender sheath was introduced into the right radial artery. 3 mg of verapamil was administered through the sheath, weight-based unfractionated heparin was administered intravenously. Standard Judkins catheters were used for selective coronary angiography and left ventriculography. Catheter exchanges were performed over an exchange length guidewire. There were no immediate procedural complications. A TR band was used for radial hemostasis at the completion of the procedure.  The patient was transferred to the post catheterization recovery area for further monitoring.  Procedural Findings: Hemodynamics: AO 133/61 LV 132/12  Coronary angiography: Coronary dominance: right  Left mainstem: the left mainstem has moderate calcification. The vessel has 20-30% stenosis in its midportion. The ostium of the left main is widely patent. The distal left main is widely patent. The stenosis in the left mainstem appears very mild in severity. The vessel divides into the LAD and left circumflex.  Left anterior descending (LAD): the LAD is medium in caliber. The vessel reaches the LV apex. The proximal vessel has diffuse irregularity with mild calcification. The first diagonal has 30-40% stenosis at its ostium. The mid LAD at that bifurcation also has 30-40% stenosis. The remaining portions of the LAD have mild nonobstructive disease.  Left circumflex (LCx): the left circumflex is patent. The mid vessel has 40% stenosis. There is a tiny obtuse marginal branch that has 50% stenosis. The major OM branch is widely patent with minor nonobstructive disease.  Right  coronary artery (RCA): this is a dominant vessel. There is diffuse calcification and irregularity but there are no high-grade stenoses present. There is no more than about 20 or 30% stenosis which is most significant at the junction of the mid and distal RCA. The PDA and PLA branches are patent.  Left ventriculography: Left ventricular systolic function is normal, LVEF is estimated at 55%, there is no significant mitral regurgitation   Estimated Blood Loss: minimal  Final Conclusions:   1. Diffusely calcified coronary arteries with only mild nonobstructive CAD 2. Normal left ventricular systolic function with normal LVEDP  Recommendations: medical management of nonobstructive CAD.  Sherren Mocha MD, [facility] 11/09/2013, 12:26 PM

## 2013-11-09 NOTE — Telephone Encounter (Signed)
New Message        Pt calling stating he had a cath done by Dr. Burt Knack today. Pt has appt w/ Dr. Acie Fredrickson scheduled for 01/13/14 and wants to know if he needs to be seen sooner by Dr. Acie Fredrickson to f/u after the cath or if any of his medications need to be changed. Please call pt back and advise.

## 2013-11-09 NOTE — Interval H&P Note (Signed)
History and Physical Interval Note:  11/09/2013 11:57 AM  Logan Sanders  has presented today for surgery, with the diagnosis of abnormal stress  The various methods of treatment have been discussed with the patient and family. After consideration of risks, benefits and other options for treatment, the patient has consented to  Procedure(s): LEFT HEART CATHETERIZATION WITH CORONARY ANGIOGRAM (N/A) as a surgical intervention .  The patient's history has been reviewed, patient examined, no change in status, stable for surgery.  I have reviewed the patient's chart and labs.  Questions were answered to the patient's satisfaction.    Cath Lab Visit (complete for each Cath Lab visit)  Clinical Evaluation Leading to the Procedure:   ACS: No.  Non-ACS:    Anginal Classification: No Symptoms  Anti-ischemic medical therapy: No Therapy  Non-Invasive Test Results: Intermediate-risk stress test findings: cardiac mortality 1-3%/year  Prior CABG: No previous CABG       Sherren Mocha

## 2013-11-10 ENCOUNTER — Telehealth: Payer: Self-pay | Admitting: Cardiovascular Disease

## 2013-11-10 NOTE — Telephone Encounter (Signed)
Spoke with patient and advised that copy of cath report will be sent to Dr. Laurann [place] per his request. I advised that per Dr. Acie Fredrickson: He has mild - moderate CAD.     This explains the coronary calcification.    Will increase his atorvastatin to 40 mg a day.    Check fasting labs in 3 months    Patient has post-cath f/u appointment with Cecilie Kicks, NP on 11/23.  Patient states he will need a written Rx for Crestor 40 mg.  I advised that I will copy note to Cecilie Kicks for her to write Rx on day of visit.  Patient states he will take Crestor 10 mg x 4 daily until he picks up new Rx since he recently had these refilled.  Patient will schedule lab appointment after f/u with Dr. Acie Fredrickson in January.  Patient verbalized understanding and agreement with plan of care.

## 2013-11-10 NOTE — Telephone Encounter (Signed)
New problem   Pt want copy of his cath report sent to his PCP Dr Kelton Pillar.

## 2013-11-10 NOTE — Telephone Encounter (Signed)
Spoke with patient.  See telephone encounter for 11/10

## 2013-11-10 NOTE — Telephone Encounter (Signed)
Correction - I had typed patient should receive Rx for Crestor 40 mg; this should be Atorvastatin (Lipitor) 40 mg.  I am sending message to Cecilie Kicks, NP since she will see patient in f/u and has been asked to write the Rx for him.

## 2013-11-10 NOTE — Telephone Encounter (Signed)
My notes indicate that he is on Atorvastatin 10 mg  - which I increase to 40 Some notes show that he is on Crestor 10 which is a fairly high dose.

## 2013-11-18 ENCOUNTER — Telehealth: Payer: Self-pay | Admitting: Cardiovascular Disease

## 2013-11-18 MED ORDER — ATORVASTATIN CALCIUM 40 MG PO TABS: 40.0000 mg | ORAL_TABLET | Freq: Every day | ORAL | Status: DC

## 2013-11-18 NOTE — Telephone Encounter (Signed)
New message            Pt would like for you to call in a prescription for Lipitor 40mg  to [facility]

## 2013-11-18 NOTE — Telephone Encounter (Signed)
Received message from patient to send Rx for Lipitor 40 mg to [facility].  Patient had previously stated he needed paper Rx and I had advised him to get this at his f/u appointment with Cecilie Kicks, ANP Monday 11/23.   I have sent the Rx for Lipitor 40 mg electronically to [facility] per patient request

## 2013-11-23 ENCOUNTER — Ambulatory Visit (INDEPENDENT_AMBULATORY_CARE_PROVIDER_SITE_OTHER): Payer: Medicare HMO | Admitting: Cardiology

## 2013-11-23 ENCOUNTER — Encounter: Payer: Self-pay | Admitting: Cardiology

## 2013-11-23 VITALS — BP 130/66 | HR 64 | Ht 70.0 in | Wt 210.4 lb

## 2013-11-23 DIAGNOSIS — E785 Hyperlipidemia, unspecified: Secondary | ICD-10-CM

## 2013-11-23 DIAGNOSIS — I2510 Atherosclerotic heart disease of native coronary artery without angina pectoris: Secondary | ICD-10-CM | POA: Insufficient documentation

## 2013-11-23 NOTE — Patient Instructions (Signed)
Keep appointment with Dr. Acie Fredrickson.

## 2013-11-23 NOTE — Assessment & Plan Note (Signed)
See above

## 2013-11-23 NOTE — Progress Notes (Signed)
11/23/2013   PCP: Osborne Casco, MD   Chief Complaint  Patient presents with  . Follow-up    pt denies chest pain, sob, and swelling.    Primary Cardiologist: Dr. Acie Fredrickson  HPI: Logan Sanders is here today for follow-up after cardiac catheterization.  61 old gentleman with a history of hypertension, hyperlipidemia and a positive family history of cardiac disease. He had been referred to Dr. Acie Fredrickson for possible stress testing given his risk factors.  He has not had any symptoms of CP or dyspnea. He worked for the [employer] for 33 years - retired 2005. Currently, he spends his time riding his [employer] Eon , plays golf, hunts deer, [place], squirrel, . Walks about a mile each day. Does all this without any CP or dyspnea. Lifts light weights and uses resistance bands.   We performed a coronary calcium score which revealed significant calcification: Dense calcification distal LM Scattered calcification of mid and distal LAD and mid and distal RCA  Subsequent stress myoview was abnormal Overall Impression: Intermediate risk stress nuclear study with mild lateral wall ischemia and mildly depressed LV systolic function.  LV Ejection Fraction: 47%. LV Wall Motion: NL LV Function; NL Wall Motion  With his risk factors for coronary disease and abnormal stress Dr. Sherlie Ban discussed diagnostic cardiac catheterization.  Cath revealed diffusely calcified coronary arteries with only mild nonobstructive disease normal LV systolic function with normal LVEDP. Medical management.  Now that we know he does have coronary artery disease he needs aggressive therapy for cholesterol his Lipitor was increased to 40 mg every day.    Today he has no complaints we discussed his disease process and the fact that if he continues to eat well take his medicines and exercise hopefully will prevent any progression of his disease and some test that his actually reverse the disease but  this is more rare.  No Known Allergies  Current Outpatient Prescriptions  Medication Sig Dispense Refill  . aspirin 81 MG tablet Take 81 mg by mouth daily.    Marland Kitchen atenolol (TENORMIN) 25 MG tablet Take 25 mg by mouth daily.     Marland Kitchen atorvastatin (LIPITOR) 40 MG tablet Take 1 tablet (40 mg total) by mouth daily. 90 tablet 3  . co-enzyme Q-10 50 MG capsule Take 100 mg by mouth daily.    Marland Kitchen doxazosin (CARDURA) 2 MG tablet Take 2 mg by mouth daily.     Marland Kitchen doxazosin (CARDURA) 4 MG tablet     . fexofenadine (ALLEGRA) 180 MG tablet Take 180 mg by mouth daily.    . Fish Oil-Cholecalciferol (FISH OIL + D3) 1000-1000 MG-UNIT CAPS Take 1,200 mg by mouth daily.    . Garlic (GARLIQUE PO) Take by mouth daily.    . multivitamin-iron-minerals-folic acid (CENTRUM) chewable tablet Chew 1 tablet by mouth daily.    Marland Kitchen SYNTHROID 100 MCG tablet Take 100 mcg by mouth daily before breakfast.     . traZODone (DESYREL) 50 MG tablet Take 50 mg by mouth at bedtime.     . valsartan-hydrochlorothiazide (DIOVAN-HCT) 320-25 MG per tablet Take 1 tablet by mouth daily.    Marland Kitchen zolpidem (AMBIEN) 10 MG tablet Take 10 mg by mouth at bedtime.      No current facility-administered medications for this visit.    Past Medical History  Diagnosis Date  . Hypertension   . Thyroid disease   . Hyperlipidemia   . BPH (benign prostatic hyperplasia)   .  ED (erectile dysfunction)   . Osteoarthritis   . CAD in native artery     Past Surgical History  Procedure Laterality Date  . Replacement total knee  2005    LEFT KNEE  . Neck surgery  1999  . Lower back surgery  1985  . Cardiac catheterization  11/09/13    non obstructive disease in all vessels.    WTU:UEKCMKL:KJ colds or fevers, no weight changes Skin:no rashes or ulcers HEENT:no blurred vision, no congestion CV:see HPI PUL:see HPI GI:no diarrhea constipation or melena, no indigestion GU:no hematuria, no dysuria MS:no joint pain, no claudication Neuro:no syncope, no  lightheadedness Endo:no diabetes, no thyroid disease  Wt Readings from Last 3 Encounters:  11/23/13 210 lb 6.4 oz (95.437 kg)  11/09/13 205 lb (92.987 kg)  11/05/13 209 lb 6.4 oz (94.983 kg)    PHYSICAL EXAM BP 130/66 mmHg  Pulse 64  Ht 5\' 10"  (1.778 m)  Wt 210 lb 6.4 oz (95.437 kg)  BMI 30.19 kg/m2 General:Pleasant affect, NAD Skin:Warm and dry, brisk capillary refill HEENT:normocephalic, sclera clear, mucus membranes moist Neck:supple, no JVD, no bruits  Heart:S1S2 RRR without murmur, gallup, rub or click Lungs:clear without rales, rhonchi, or wheezes ZPH:XTAV, non tender, + BS, do not palpate liver spleen or masses Ext:no lower ext edema, 2+ pedal pulses, 2+ radial pulses Neuro:alert and oriented, MAE, follows commands, + facial symmetry     ASSESSMENT AND PLAN CAD, multiple vessel Recent cath with non obstructive disease -though his new LDL goal is 70 and HDL 45-50.  He is now on Lipitor 40 mg daily.  We reviewed need for higher dose.  Hyperlipidemia See above    He will follow-up as already scheduled with Dr. Acie Fredrickson.

## 2013-11-23 NOTE — Assessment & Plan Note (Signed)
Recent cath with non obstructive disease -though his new LDL goal is 70 and HDL 45-50.  He is now on Lipitor 40 mg daily.  We reviewed need for higher dose.

## 2013-12-10 ENCOUNTER — Encounter (HOSPITAL_COMMUNITY): Payer: Self-pay | Admitting: Cardiovascular Disease

## 2014-01-12 NOTE — Progress Notes (Signed)
Logan Sanders Date of Birth  1945/06/03       [facility] [address], [address], [place], [place]  [address]   [place],   [address] [place]   Fax  [phone]     Fax [phone]  Problem List: 1. Hypertension 2. Hyperlipidemia 3. Hypothyroidism  History of Present Illness:  Logan Sanders is a 84 old gentleman with a history of hypertension, hyperlipidemia and a positive family history of cardiac disease. He was referred for possible stress testing given his risk factors.  He has not had any symptoms of CP or dyspnea.  He worked for the [employer] for 33 years - retired 2005.  Currently, he spends his time riding his [employer] Eon , plays golf, hunts deer, [place], squirrel, .  Walks about a mile each day.  Does all this without any CP or dyspnea.  Lifts light weights and uses resistance bands.    Grandfather died at age 60, father died at age 59. Uncle died in his 50's   November 19, 2013:  Logan Sanders presents today for followup. We performed a coronary calcium score which revealed significant calcification: Dense calcification distal LM Scattered calcification of mid and distal LAD and mid and distal RCA  Subsequent stress myoview was abnormal Overall Impression: Intermediate risk stress nuclear study with mild lateral wall ischemia and mildly depressed LV systolic function.  LV Ejection Fraction: 47%. LV Wall Motion: NL LV Function; NL Wall Motion  Jan. 13, 2016: Logan Sanders is a 69 yo with an abnormal coronary calcium score - significant calcification: Dense calcification distal LM Scattered calcification of mid and distal LAD and mid and distal RCA Myoview was abnormal and he was referred for cath.   LM  moderate calcification. The vessel has 20-30% stenosis in its midportion. The ostium of the left main is widely patent. The distal left main is widely patent.    (LAD): the LAD is medium  in caliber. The vessel reaches the LV apex. The proximal vessel has diffuse irregularity with mild calcification.  D1 has 30-40% stenosis at its ostium. The mid LAD at that bifurcation also has 30-40% stenosis. The remaining portions of the LAD have mild nonobstructive disease. Left circumflex (LCx): the left circumflex is patent. The mid vessel has 40% stenosis. There is a tiny obtuse marginal branch that has 50% stenosis. The major OM branch is widely patent with minor nonobstructive disease. Right coronary artery (RCA): this is a dominant vessel. There is diffuse calcification and irregularity but there are no high-grade stenoses present. There is no more than about 20 or 30% stenosis which is most significant at the junction of the mid and distal RCA. The PDA and PLA branches are patent. Left ventriculography: Left ventricular systolic function is normal, LVEF is estimated at 55%  Feeling well.  No CP . The cath showed mild - moderate irregularities. He has not had any further issues    Current Outpatient Prescriptions  Medication Sig Dispense Refill  . aspirin 81 MG tablet Take 81 mg by mouth daily.    Marland Kitchen atenolol (TENORMIN) 25 MG tablet Take 25 mg by mouth daily.     Marland Kitchen atorvastatin (LIPITOR) 40 MG tablet Take 1 tablet (40 mg total) by mouth daily. 90 tablet 3  . co-enzyme Q-10 50 MG capsule Take 100 mg by mouth daily.    Marland Kitchen doxazosin (CARDURA) 2 MG  tablet Take 2 mg by mouth daily.     Marland Kitchen doxazosin (CARDURA) 4 MG tablet     . fexofenadine (ALLEGRA) 180 MG tablet Take 180 mg by mouth daily.    . Fish Oil-Cholecalciferol (FISH OIL + D3) 1000-1000 MG-UNIT CAPS Take 1,200 mg by mouth daily.    . Garlic (GARLIQUE PO) Take by mouth daily.    . multivitamin-iron-minerals-folic acid (CENTRUM) chewable tablet Chew 1 tablet by mouth daily.    Marland Kitchen SYNTHROID 100 MCG tablet Take 100 mcg by mouth daily before breakfast.     . traZODone (DESYREL) 50 MG tablet Take 50 mg by mouth at bedtime.     .  valsartan-hydrochlorothiazide (DIOVAN-HCT) 320-25 MG per tablet Take 1 tablet by mouth daily.    Marland Kitchen zolpidem (AMBIEN) 10 MG tablet Take 10 mg by mouth at bedtime.      No current facility-administered medications for this visit.     No Known Allergies  Past Medical History  Diagnosis Date  . Hypertension   . Thyroid disease   . Hyperlipidemia   . BPH (benign prostatic hyperplasia)   . ED (erectile dysfunction)   . Osteoarthritis   . CAD in native artery     Past Surgical History  Procedure Laterality Date  . Replacement total knee  2005    LEFT KNEE  . Neck surgery  1999  . Lower back surgery  1985  . Cardiac catheterization  11/09/13    non obstructive disease in all vessels.  . Left heart catheterization with coronary angiogram N/A 11/09/2013    Procedure: LEFT HEART CATHETERIZATION WITH CORONARY ANGIOGRAM;  Surgeon: Blane Ohara, MD;  Location: [facility] CATH LAB;  Service: Cardiovascular;  Laterality: N/A;    History  Smoking status  . Former Smoker  . Quit date: 05/06/1999  Smokeless tobacco  . Not on file    History  Alcohol Use  . 0.6 oz/week  . 1 Cans of beer per week    Family History  Problem Relation Age of Onset  . Asthma Mother   . Parkinsonism Mother   . Heart disease Father   . Heart attack Father   . Prostate cancer Father   . Hyperlipidemia Brother     Reviw of Systems:  Reviewed in the HPI.  All other systems are negative.  Physical Exam: There were no vitals taken for this visit. Wt Readings from Last 3 Encounters:  11/23/13 210 lb 6.4 oz (95.437 kg)  11/09/13 205 lb (92.987 kg)  11/05/13 209 lb 6.4 oz (94.983 kg)     General: Well developed, well nourished, in no acute distress.  Head: Normocephalic, atraumatic, sclera non-icteric, mucus membranes are moist,   Neck: Supple. Carotids are 2 + without bruits. No JVD   Lungs: Clear   Heart: RR, normal S1S2  Abdomen: Soft, non-tender, non-distended with normal bowel  sounds.  Msk:  Strength and tone are normal   Extremities: No clubbing or cyanosis. No edema.  Distal pedal pulses are 2+ and equal   Neuro: CN II - XII intact.  Alert and oriented X 3.   Psych:  Normal  ECG:  Assessment / Plan:

## 2014-01-13 ENCOUNTER — Encounter: Payer: Self-pay | Admitting: Cardiovascular Disease

## 2014-01-13 ENCOUNTER — Ambulatory Visit (INDEPENDENT_AMBULATORY_CARE_PROVIDER_SITE_OTHER): Payer: PPO | Admitting: Cardiovascular Disease

## 2014-01-13 DIAGNOSIS — I2510 Atherosclerotic heart disease of native coronary artery without angina pectoris: Secondary | ICD-10-CM

## 2014-01-13 LAB — BASIC METABOLIC PANEL
BUN: 24 mg/dL — ABNORMAL HIGH (ref 6–23)
CO2: 27 meq/L (ref 19–32)
Calcium: 9.3 mg/dL (ref 8.4–10.5)
Chloride: 105 mEq/L (ref 96–112)
Creatinine, Ser: 1.51 mg/dL — ABNORMAL HIGH (ref 0.40–1.50)
GFR: 49.22 mL/min — ABNORMAL LOW (ref >60.00)
Glucose, Bld: 108 mg/dL — ABNORMAL HIGH (ref 70–99)
Potassium: 3.7 mEq/L (ref 3.5–5.1)
Sodium: 137 mEq/L (ref 135–145)

## 2014-01-13 LAB — LIPID PANEL
CHOL/HDL RATIO: 3
CHOLESTEROL: 111 mg/dL (ref 0–200)
HDL: 38.8 mg/dL — ABNORMAL LOW (ref >39.00)
LDL Cholesterol: 58 mg/dL (ref 0–99)
NonHDL: 72.2
TRIGLYCERIDES: 72 mg/dL (ref 0.0–149.0)
VLDL: 14.4 mg/dL (ref 0.0–40.0)

## 2014-01-13 LAB — HEPATIC FUNCTION PANEL
ALT: 39 U/L (ref 0–53)
AST: 28 U/L (ref 0–37)
Albumin: 4.3 g/dL (ref 3.5–5.2)
Alkaline Phosphatase: 61 U/L (ref 39–117)
Bilirubin, Direct: 0.2 mg/dL (ref 0.0–0.3)
TOTAL PROTEIN: 7 g/dL (ref 6.0–8.3)
Total Bilirubin: 0.8 mg/dL (ref 0.2–1.2)

## 2014-01-13 NOTE — Patient Instructions (Signed)
Your physician recommends that you have lab work today:  [place], [place], [place]  Your physician recommends that you continue on your current medications as directed. Please refer to the Current Medication list given to you today.  Your physician wants you to follow-up in: 6 months with Dr. Acie Fredrickson.  You will receive a reminder letter in the mail two months in advance. If you don't receive a letter, please call our office to schedule the follow-up appointment. Your physician recommends that you return for lab work in: 6 months on the day of or a few days before your office visit with Dr. Acie Fredrickson.  You will need to FAST for this appointment - nothing to eat or drink after midnight the night before except water.

## 2014-01-13 NOTE — Assessment & Plan Note (Signed)
Was started on Atorvatatin 40 a day 2 months ago. Will check labs today. Repeat labs in 6 months at next office visit

## 2014-01-13 NOTE — Assessment & Plan Note (Addendum)
He has mild disease of all 3 coronaries.   None of the lesions are significant.   Continue aggressive lipid management.

## 2014-01-15 ENCOUNTER — Telehealth: Payer: Self-pay | Admitting: Cardiovascular Disease

## 2014-01-15 NOTE — Telephone Encounter (Signed)
Calling back for specific lab results.  Logan Sanders  had called and LM that labs were normal.  He just wanted to know the numbers.  Dr. Acie Fredrickson had increased his Lipitor and was told if still elevated he may have to change medication.  Advised of specific numbers and told him all was good. He was very happy about the results.  Will continue on same medication.

## 2014-01-15 NOTE — Telephone Encounter (Signed)
New Msg        Pt returning call from today, states he has some questions. No details given.    Pt will be avail. Until 3:30 Please return call.

## 2014-07-19 ENCOUNTER — Encounter: Payer: Self-pay | Admitting: Cardiovascular Disease

## 2014-07-19 ENCOUNTER — Other Ambulatory Visit (INDEPENDENT_AMBULATORY_CARE_PROVIDER_SITE_OTHER): Payer: PPO | Admitting: *Deleted

## 2014-07-19 ENCOUNTER — Ambulatory Visit (INDEPENDENT_AMBULATORY_CARE_PROVIDER_SITE_OTHER): Payer: PPO | Admitting: Cardiovascular Disease

## 2014-07-19 VITALS — BP 140/60 | HR 43 | Ht 70.0 in | Wt 210.0 lb

## 2014-07-19 DIAGNOSIS — I10 Essential (primary) hypertension: Secondary | ICD-10-CM

## 2014-07-19 DIAGNOSIS — I2510 Atherosclerotic heart disease of native coronary artery without angina pectoris: Secondary | ICD-10-CM

## 2014-07-19 DIAGNOSIS — E785 Hyperlipidemia, unspecified: Secondary | ICD-10-CM | POA: Diagnosis not present

## 2014-07-19 LAB — LIPID PANEL
CHOLESTEROL: 108 mg/dL (ref 0–200)
HDL: 42.7 mg/dL (ref >39.00)
LDL Cholesterol: 49 mg/dL (ref 0–99)
NONHDL: 65.3
Total CHOL/HDL Ratio: 3
Triglycerides: 83 mg/dL (ref 0.0–149.0)
VLDL: 16.6 mg/dL (ref 0.0–40.0)

## 2014-07-19 LAB — HEPATIC FUNCTION PANEL
ALT: 39 U/L (ref 0–53)
AST: 28 U/L (ref 0–37)
Albumin: 4.3 g/dL (ref 3.5–5.2)
Alkaline Phosphatase: 52 U/L (ref 39–117)
BILIRUBIN DIRECT: 0.1 mg/dL (ref 0.0–0.3)
Total Bilirubin: 0.7 mg/dL (ref 0.2–1.2)
Total Protein: 6.8 g/dL (ref 6.0–8.3)

## 2014-07-19 LAB — BASIC METABOLIC PANEL
BUN: 23 mg/dL (ref 6–23)
CALCIUM: 9.8 mg/dL (ref 8.4–10.5)
CO2: 29 meq/L (ref 19–32)
Chloride: 105 mEq/L (ref 96–112)
Creatinine, Ser: 1.32 mg/dL (ref 0.40–1.50)
GFR: 57.39 mL/min — AB (ref >60.00)
Glucose, Bld: 102 mg/dL — ABNORMAL HIGH (ref 70–99)
Potassium: 4.4 mEq/L (ref 3.5–5.1)
Sodium: 140 mEq/L (ref 135–145)

## 2014-07-19 MED ORDER — CARVEDILOL 3.125 MG PO TABS: 3.1250 mg | ORAL_TABLET | Freq: Two times a day (BID) | ORAL | Status: DC

## 2014-07-19 NOTE — Progress Notes (Signed)
Logan Sanders Date of Birth  1945/04/14       [facility] [address], [address], [place], [place]  [address]   [place],   [address] [place]   Fax  [phone]     Fax [phone]  Problem List: 1. Hypertension 2. Hyperlipidemia 3. Hypothyroidism 4. CAD -   Moderate CAD by cath 11/09/2013  History of Present Illness:  [name] is a 5 old gentleman with a history of hypertension, hyperlipidemia and a positive family history of cardiac disease. He was referred for possible stress testing given his risk factors.  He has not had any symptoms of CP or dyspnea.  He worked for the [employer] for 33 years - retired 2005.  Currently, he spends his time riding his [place] Eon , plays golf, hunts deer, [place], squirrel, .  Walks about a mile each day.  Does all this without any CP or dyspnea.  Lifts light weights and uses resistance bands.    Grandfather died at age 83, father died at age 33. Uncle died in his 52's   2013-11-11:  Shadi presents today for followup. We performed a coronary calcium score which revealed significant calcification: Dense calcification distal LM Scattered calcification of mid and distal LAD and mid and distal RCA  Subsequent stress myoview was abnormal Overall Impression: Intermediate risk stress nuclear study with mild lateral wall ischemia and mildly depressed LV systolic function.  LV Ejection Fraction: 47%. LV Wall Motion: NL LV Function; NL Wall Motion  Jan. 13, 2016: Favor is a 69 yo with an abnormal coronary calcium score - significant calcification: Dense calcification distal LM Scattered calcification of mid and distal LAD and mid and distal RCA Myoview was abnormal and he was referred for cath.   LM  moderate calcification. The vessel has 20-30% stenosis in its midportion. The ostium of the left main is widely patent. The distal left main is  widely patent.    (LAD): the LAD is medium in caliber. The vessel reaches the LV apex. The proximal vessel has diffuse irregularity with mild calcification.  D1 has 30-40% stenosis at its ostium. The mid LAD at that bifurcation also has 30-40% stenosis. The remaining portions of the LAD have mild nonobstructive disease. Left circumflex (LCx): the left circumflex is patent. The mid vessel has 40% stenosis. There is a tiny obtuse marginal branch that has 50% stenosis. The major OM branch is widely patent with minor nonobstructive disease. Right coronary artery (RCA): this is a dominant vessel. There is diffuse calcification and irregularity but there are no high-grade stenoses present. There is no more than about 20 or 30% stenosis which is most significant at the junction of the mid and distal RCA. The PDA and PLA branches are patent. Left ventriculography: Left ventricular systolic function is normal, LVEF is estimated at 55%  Feeling well.  No CP . The cath showed mild - moderate irregularities. He has not had any further issues  July 19, 2014:   [name] is doing well.  No dizziness, no syncope  Has some ringing in his ears.  Takes his BP at home, readings are in the normal range.    Current Outpatient Prescriptions  Medication Sig Dispense Refill  . aspirin 81 MG tablet Take 81 mg by mouth daily. 1 TABLET    . atenolol (TENORMIN) 25 MG tablet Take 25 mg  by mouth daily. 1 TABLET    . atorvastatin (LIPITOR) 40 MG tablet Take 1 tablet (40 mg total) by mouth daily. 90 tablet 3  . co-enzyme Q-10 50 MG capsule Take 100 mg by mouth daily. 2 TABLETS    . doxazosin (CARDURA) 4 MG tablet Take 4 mg by mouth daily. 1 TABLETS    . fexofenadine (ALLEGRA) 180 MG tablet Take 180 mg by mouth daily.    . Fish Oil-Cholecalciferol (FISH OIL + D3) 1000-1000 MG-UNIT CAPS Take 1,200 mg by mouth daily.    . Garlic (GARLIQUE PO) Take by mouth daily.    . multivitamin-iron-minerals-folic acid (CENTRUM) chewable  tablet Chew 1 tablet by mouth daily.    Marland Kitchen SYNTHROID 100 MCG tablet Take 100 mcg by mouth daily before breakfast.     . traZODone (DESYREL) 50 MG tablet Take 50 mg by mouth at bedtime.     . valsartan-hydrochlorothiazide (DIOVAN-HCT) 320-25 MG per tablet Take 1 tablet by mouth daily.    Marland Kitchen zolpidem (AMBIEN) 10 MG tablet Take 10 mg by mouth at bedtime.      No current facility-administered medications for this visit.     No Known Allergies  Past Medical History  Diagnosis Date  . Hypertension   . Thyroid disease   . Hyperlipidemia   . BPH (benign prostatic hyperplasia)   . ED (erectile dysfunction)   . Osteoarthritis   . CAD in native artery     Past Surgical History  Procedure Laterality Date  . Replacement total knee  2005    LEFT KNEE  . Neck surgery  1999  . Lower back surgery  1985  . Cardiac catheterization  11/09/13    non obstructive disease in all vessels.  . Left heart catheterization with coronary angiogram N/A 11/09/2013    Procedure: LEFT HEART CATHETERIZATION WITH CORONARY ANGIOGRAM;  Surgeon: Blane Ohara, MD;  Location: [facility] CATH LAB;  Service: Cardiovascular;  Laterality: N/A;    History  Smoking status  . Former Smoker  . Quit date: 05/06/1999  Smokeless tobacco  . Not on file    History  Alcohol Use  . 0.6 oz/week  . 1 Cans of beer per week    Family History  Problem Relation Age of Onset  . Asthma Mother   . Parkinsonism Mother   . Heart disease Father   . Heart attack Father   . Prostate cancer Father   . Hyperlipidemia Brother     Reviw of Systems:  Reviewed in the HPI.  All other systems are negative.  Physical Exam: Blood pressure 140/60, pulse 43, height 5\' 10"  (1.778 m), weight 95.255 kg (210 lb). Wt Readings from Last 3 Encounters:  07/19/14 95.255 kg (210 lb)  01/13/14 94.076 kg (207 lb 6.4 oz)  11/23/13 95.437 kg (210 lb 6.4 oz)     General: Well developed, well nourished, in no acute distress.  Head: Normocephalic,  atraumatic, sclera non-icteric, mucus membranes are moist,   Neck: Supple. Carotids are 2 + without bruits. No JVD   Lungs: Clear   Heart: RR, normal S1S2  Abdomen: Soft, non-tender, non-distended with normal bowel sounds.  Msk:  Strength and tone are normal   Extremities: No clubbing or cyanosis. No edema.  Distal pedal pulses are 2+ and equal   Neuro: CN II - XII intact.  Alert and oriented X 3.   Psych:  Normal  ECG: July 19, 2014:  Sinus brady at 43. ,    Assessment /  Plan:   1. Hypertension - his blood pressures fairly well-controlled. His heart rate is very slow. We will stop the atenolol and we'll try him on carvedilol 3.125 mg twice a day. I'll see him again in 6 months for follow-up visit.  2. Hyperlipidemia - we'll check fasting labs today.  3. Hypothyroidism  4. CAD -   Moderate CAD by cath 11/09/2013, no angina    Lonna Rabold, Wonda Cheng, MD  07/19/2014 9:17 AM    [facility] [address],  [place], [place]  [address] Pager [phone] Phone: [phone]; Fax: [phone]   [facility]  [address] [place], [place]  [address] [phone]    Fax [phone]

## 2014-07-19 NOTE — Patient Instructions (Signed)
Medication Instructions:  START Carvedilol 3.125 mg twice daily - take 12 hours apart STOP Atenolol  Labwork: Completed today  Testing/Procedures: None Ordered  Follow-Up: Your physician wants you to follow-up in: 6 months with Dr. Acie Fredrickson.  You will receive a reminder letter in the mail two months in advance. If you don't receive a letter, please call our office to schedule the follow-up appointment.

## 2014-07-19 NOTE — Addendum Note (Signed)
Addended by: Eulis Foster on: 07/19/2014 08:13 AM   Modules accepted: Orders

## 2014-11-16 ENCOUNTER — Other Ambulatory Visit: Payer: Self-pay | Admitting: Cardiovascular Disease

## 2014-11-17 ENCOUNTER — Other Ambulatory Visit: Payer: Self-pay | Admitting: *Deleted

## 2014-11-17 MED ORDER — ATORVASTATIN CALCIUM 40 MG PO TABS: 40.0000 mg | ORAL_TABLET | Freq: Every day | ORAL | Status: DC

## 2015-01-24 ENCOUNTER — Ambulatory Visit (INDEPENDENT_AMBULATORY_CARE_PROVIDER_SITE_OTHER): Payer: PPO | Admitting: Cardiovascular Disease

## 2015-01-24 ENCOUNTER — Encounter (INDEPENDENT_AMBULATORY_CARE_PROVIDER_SITE_OTHER): Payer: Self-pay

## 2015-01-24 ENCOUNTER — Encounter: Payer: Self-pay | Admitting: Cardiovascular Disease

## 2015-01-24 VITALS — BP 132/70 | HR 80 | Ht 70.0 in | Wt 219.0 lb

## 2015-01-24 DIAGNOSIS — I2510 Atherosclerotic heart disease of native coronary artery without angina pectoris: Secondary | ICD-10-CM | POA: Diagnosis not present

## 2015-01-24 DIAGNOSIS — I5022 Chronic systolic (congestive) heart failure: Secondary | ICD-10-CM | POA: Insufficient documentation

## 2015-01-24 DIAGNOSIS — I10 Essential (primary) hypertension: Secondary | ICD-10-CM | POA: Diagnosis not present

## 2015-01-24 MED ORDER — CARVEDILOL 6.25 MG PO TABS: 6.2500 mg | ORAL_TABLET | Freq: Two times a day (BID) | ORAL | Status: DC

## 2015-01-24 NOTE — Progress Notes (Signed)
Logan Sanders Date of Birth  04-24-1945       [facility] [id number], [place], [place]  [address]   [place], [place]  [address] [address]   Fax  [phone]     Fax [phone]  Problem List: 1. Hypertension 2. Hyperlipidemia 3. Hypothyroidism 4. CAD -   Moderate CAD by cath 11/09/2013  History of Present Illness:  Logan Sanders is a 70 old gentleman with a history of hypertension, hyperlipidemia and a positive family history of cardiac disease. He was referred for possible stress testing given his risk factors.  He has not had any symptoms of CP or dyspnea.  He worked for the [employer] for 33 years - retired 2005.  Currently, he spends his time riding his [place] , plays golf, hunts deer, [place], squirrel, .  Walks about a mile each day.  Does all this without any CP or dyspnea.  Lifts light weights and uses resistance bands.    Grandfather died at age 58, father died at age 72. Uncle died in his 11's   2013/11/26:  Logan Sanders presents today for followup. We performed a coronary calcium score which revealed significant calcification: Dense calcification distal LM Scattered calcification of mid and distal LAD and mid and distal RCA  Subsequent stress myoview was abnormal Overall Impression: Intermediate risk stress nuclear study with mild lateral wall ischemia and mildly depressed LV systolic function.  LV Ejection Fraction: 47%. LV Wall Motion: NL LV Function; NL Wall Motion  Jan. 13, 2016: Logan Sanders is a 70 yo with an abnormal coronary calcium score - significant calcification: Dense calcification distal LM Scattered calcification of mid and distal LAD and mid and distal RCA Myoview was abnormal and he was referred for cath.   LM  moderate calcification. The vessel has 20-30% stenosis in its midportion. The ostium of the left main is widely patent. The distal left main is  widely patent.    (LAD): the LAD is medium in caliber. The vessel reaches the LV apex. The proximal vessel has diffuse irregularity with mild calcification.  D1 has 30-40% stenosis at its ostium. The mid LAD at that bifurcation also has 30-40% stenosis. The remaining portions of the LAD have mild nonobstructive disease. Left circumflex (LCx): the left circumflex is patent. The mid vessel has 40% stenosis. There is a tiny obtuse marginal branch that has 50% stenosis. The major OM branch is widely patent with minor nonobstructive disease. Right coronary artery (RCA): this is a dominant vessel. There is diffuse calcification and irregularity but there are no high-grade stenoses present. There is no more than about 20 or 30% stenosis which is most significant at the junction of the mid and distal RCA. The PDA and PLA branches are patent. Left ventriculography: Left ventricular systolic function is normal, LVEF is estimated at 55%  Feeling well.  No CP . The cath showed mild - moderate irregularities. He has not had any further issues  July 19, 2014:   Logan Sanders is doing well.  No dizziness, no syncope  Has some ringing in his ears.  Takes his BP at home, readings are in the normal range.   Jan. 23, 2017: Logan Sanders is seen back for a follow up visit. Walks on occasion .  Not as much exercise as in the summer . No angina . No dyspnea.  BP is well  controlled.      Current Outpatient Prescriptions  Medication Sig Dispense Refill  . aspirin 81 MG tablet Take 81 mg by mouth daily. 1 TABLET    . atorvastatin (LIPITOR) 40 MG tablet Take 1 tablet (40 mg total) by mouth daily. 90 tablet 2  . carvedilol (COREG) 3.125 MG tablet Take 1 tablet (3.125 mg total) by mouth 2 (two) times daily. 62 tablet 11  . co-enzyme Q-10 50 MG capsule Take 100 mg by mouth daily. 2 TABLETS    . doxazosin (CARDURA) 4 MG tablet Take 4 mg by mouth daily. 1 TABLETS    . fexofenadine (ALLEGRA) 180 MG tablet Take 180 mg by mouth  daily.    . Fish Oil-Cholecalciferol (FISH OIL + D3) 1000-1000 MG-UNIT CAPS Take 1,200 mg by mouth daily.    . Garlic (GARLIQUE PO) Take by mouth daily.    . multivitamin-iron-minerals-folic acid (CENTRUM) chewable tablet Chew 1 tablet by mouth daily.    Marland Kitchen SYNTHROID 100 MCG tablet Take 100 mcg by mouth daily before breakfast.     . traZODone (DESYREL) 50 MG tablet Take 50 mg by mouth at bedtime.     . valsartan-hydrochlorothiazide (DIOVAN-HCT) 320-25 MG per tablet Take 1 tablet by mouth daily.    Marland Kitchen zolpidem (AMBIEN) 10 MG tablet Take 10 mg by mouth at bedtime.      No current facility-administered medications for this visit.     No Known Allergies  Past Medical History  Diagnosis Date  . Hypertension   . Thyroid disease   . Hyperlipidemia   . BPH (benign prostatic hyperplasia)   . ED (erectile dysfunction)   . Osteoarthritis   . CAD in native artery     Past Surgical History  Procedure Laterality Date  . Replacement total knee  2005    LEFT KNEE  . Neck surgery  1999  . Lower back surgery  1985  . Cardiac catheterization  11/09/13    non obstructive disease in all vessels.  . Left heart catheterization with coronary angiogram N/A 11/09/2013    Procedure: LEFT HEART CATHETERIZATION WITH CORONARY ANGIOGRAM;  Surgeon: Blane Ohara, MD;  Location: [facility] CATH LAB;  Service: Cardiovascular;  Laterality: N/A;    History  Smoking status  . Former Smoker  . Quit date: 05/06/1999  Smokeless tobacco  . Not on file    History  Alcohol Use  . 0.6 oz/week  . 1 Cans of beer per week    Family History  Problem Relation Age of Onset  . Asthma Mother   . Parkinsonism Mother   . Heart disease Father   . Heart attack Father   . Prostate cancer Father   . Hyperlipidemia Brother     Reviw of Systems:  Reviewed in the HPI.  All other systems are negative.  Physical Exam: Blood pressure 132/70, pulse 80, height 5\' 10"  (1.778 m), weight 219 lb (99.338 kg). Wt Readings from  Last 3 Encounters:  01/24/15 219 lb (99.338 kg)  07/19/14 210 lb (95.255 kg)  01/13/14 207 lb 6.4 oz (94.076 kg)     General: Well developed, well nourished, in no acute distress.  Head: Normocephalic, atraumatic, sclera non-icteric, mucus membranes are moist,   Neck: Supple. Carotids are 2 + without bruits. No JVD   Lungs: Clear   Heart: RR, normal S1S2  Abdomen: Soft, non-tender, non-distended with normal bowel sounds.  Msk:  Strength and tone are normal   Extremities: No clubbing or cyanosis. No edema.  Distal pedal pulses are 2+ and equal   Neuro: CN II - XII intact.  Alert and oriented X 3.   Psych:  Normal  ECG:     Assessment / Plan:   1. Hypertension - his blood pressures fairly well-controlled.   Will increase his coreg given his mildly depressed LV systolic function .  2. Hyperlipidemia - we'll check fasting labs today.  3. Hypothyroidism  4. CAD -   Moderate CAD by cath 11/09/2013, no angina   5. Mild chronic systolic CHF:   EF [id number] by myoview. He is on low dose Coreg.  Will increase coreg up to 6. 25 BID.  Continue Valsartan / HCTZ Will get an echo in 6 months with an office visit the week after.   Nahser, Wonda Cheng, MD  01/24/2015 10:39 AM    [address],  [place], [place]  [address] Pager [phone] Phone: [phone]; Fax: [phone]   [facility]  [address] [place], [place]  [address] [phone]    Fax [phone]

## 2015-01-24 NOTE — Patient Instructions (Signed)
Medication Instructions:  Please increase your to Carvedilol 6.25 mg twice a day. Continue all other medications as listed.  Testing/Procedures: Your physician has requested that you have an echocardiogram about 1 week before seeing Dr Acie Fredrickson. Echocardiography is a painless test that uses sound waves to create images of your heart. It provides your doctor with information about the size and shape of your heart and how well your heart's chambers and valves are working. This procedure takes approximately one hour. There are no restrictions for this procedure.   Follow-Up: Follow up in 6 months with Dr. Acie Fredrickson.  You will receive a letter in the mail 2 months before you are due.  Please call us when you receive this letter to schedule your follow up appointment.  If you need a refill on your cardiac medications before your next appointment, please call your pharmacy.  Thank you for choosing [facility]!!

## 2015-02-01 DIAGNOSIS — L218 Other seborrheic dermatitis: Secondary | ICD-10-CM | POA: Diagnosis not present

## 2015-02-01 DIAGNOSIS — D225 Melanocytic nevi of trunk: Secondary | ICD-10-CM | POA: Diagnosis not present

## 2015-02-01 DIAGNOSIS — D2362 Other benign neoplasm of skin of left upper limb, including shoulder: Secondary | ICD-10-CM | POA: Diagnosis not present

## 2015-02-01 DIAGNOSIS — L821 Other seborrheic keratosis: Secondary | ICD-10-CM | POA: Diagnosis not present

## 2015-02-28 DIAGNOSIS — M25561 Pain in right knee: Secondary | ICD-10-CM | POA: Diagnosis not present

## 2015-05-02 DIAGNOSIS — R7309 Other abnormal glucose: Secondary | ICD-10-CM | POA: Diagnosis not present

## 2015-05-02 DIAGNOSIS — R7303 Prediabetes: Secondary | ICD-10-CM | POA: Diagnosis not present

## 2015-05-02 DIAGNOSIS — I129 Hypertensive chronic kidney disease with stage 1 through stage 4 chronic kidney disease, or unspecified chronic kidney disease: Secondary | ICD-10-CM | POA: Diagnosis not present

## 2015-05-02 DIAGNOSIS — N183 Chronic kidney disease, stage 3 (moderate): Secondary | ICD-10-CM | POA: Diagnosis not present

## 2015-05-02 DIAGNOSIS — H6120 Impacted cerumen, unspecified ear: Secondary | ICD-10-CM | POA: Diagnosis not present

## 2015-05-02 DIAGNOSIS — E784 Other hyperlipidemia: Secondary | ICD-10-CM | POA: Diagnosis not present

## 2015-05-02 DIAGNOSIS — E039 Hypothyroidism, unspecified: Secondary | ICD-10-CM | POA: Diagnosis not present

## 2015-07-18 ENCOUNTER — Other Ambulatory Visit (HOSPITAL_COMMUNITY): Payer: PPO

## 2015-07-28 ENCOUNTER — Encounter: Payer: Self-pay | Admitting: Physician Assistant

## 2015-07-28 ENCOUNTER — Ambulatory Visit (INDEPENDENT_AMBULATORY_CARE_PROVIDER_SITE_OTHER): Payer: PPO | Admitting: Physician Assistant

## 2015-07-28 ENCOUNTER — Other Ambulatory Visit (HOSPITAL_COMMUNITY): Payer: Self-pay

## 2015-07-28 ENCOUNTER — Ambulatory Visit (HOSPITAL_COMMUNITY): Payer: PPO | Attending: Cardiology

## 2015-07-28 VITALS — BP 138/102 | HR 70 | Ht 70.0 in | Wt 217.5 lb

## 2015-07-28 DIAGNOSIS — I509 Heart failure, unspecified: Secondary | ICD-10-CM | POA: Diagnosis not present

## 2015-07-28 DIAGNOSIS — I10 Essential (primary) hypertension: Secondary | ICD-10-CM

## 2015-07-28 DIAGNOSIS — I110 Hypertensive heart disease with heart failure: Secondary | ICD-10-CM | POA: Insufficient documentation

## 2015-07-28 DIAGNOSIS — I2510 Atherosclerotic heart disease of native coronary artery without angina pectoris: Secondary | ICD-10-CM

## 2015-07-28 DIAGNOSIS — I4891 Unspecified atrial fibrillation: Secondary | ICD-10-CM | POA: Diagnosis not present

## 2015-07-28 DIAGNOSIS — E785 Hyperlipidemia, unspecified: Secondary | ICD-10-CM | POA: Diagnosis not present

## 2015-07-28 DIAGNOSIS — I5022 Chronic systolic (congestive) heart failure: Secondary | ICD-10-CM

## 2015-07-28 DIAGNOSIS — I480 Paroxysmal atrial fibrillation: Secondary | ICD-10-CM | POA: Diagnosis not present

## 2015-07-28 DIAGNOSIS — E039 Hypothyroidism, unspecified: Secondary | ICD-10-CM

## 2015-07-28 DIAGNOSIS — N183 Chronic kidney disease, stage 3 unspecified: Secondary | ICD-10-CM

## 2015-07-28 LAB — LIPID PANEL
Cholesterol: 124 mg/dL — ABNORMAL LOW (ref 125–200)
HDL: 48 mg/dL (ref >=40)
LDL Cholesterol: 59 mg/dL (ref <130)
TRIGLYCERIDES: 83 mg/dL (ref <150)
Total CHOL/HDL Ratio: 2.6 Ratio (ref <=5.0)
VLDL: 17 mg/dL (ref <30)

## 2015-07-28 LAB — TSH: TSH: 1.86 m[IU]/L (ref 0.40–4.50)

## 2015-07-28 MED ORDER — APIXABAN 5 MG PO TABS: 5.0000 mg | ORAL_TABLET | Freq: Two times a day (BID) | ORAL | 3 refills | Status: DC

## 2015-07-28 NOTE — Patient Instructions (Addendum)
Medication Instructions:  STOP ASPIRIN START Eliquis Take 1 tab (5mg ) by mouth twice a day  Labwork: TODAY: BMET, CBC, TSH, LIPID  Testing/Procedures: Complete a EKG in 3 weeks  Follow-up: 1. Nurse visit in 3 weeks for EKG   Any Other Special Instructions Will Be Listed Below (If Applicable).     If you need a refill on your cardiac medications before your next appointment, please call your pharmacy.

## 2015-07-28 NOTE — Progress Notes (Signed)
Cardiology Office Note    Date:  07/28/2015   ID:  MARTIN CHAUHAN, DOB 1945/02/11, MRN [id number]  PCP:  Osborne Casco, MD  Cardiologist:  Dr. Acie Fredrickson   Chief Complaint  Patient presents with  . Follow-up    seen for Dr. Acie Fredrickson    History of Present Illness:  Logan Sanders is a 70 y.o. male with past medical history of HTN, HLD, hypothyroidism, CKD stage III and moderate CAD. In November 2015, he had a coronary calcium score which revealed significant calcification he also was noted to have dense calcification in the distal left main, scattered calcification in mid and distal LAD and mid and distal RCA. Subsequent Myoview was abnormal with EF 47%, intermediate risk with mild lateral wall ischemia. He underwent cardiac catheterization on 11/09/2013 which showed 20-30% mid left main lesion, 30-40% stenosis in the ostial D1, 30-40% stenosis in mid LAD, 40% mid left circumflex stenosis, tiny OM branch with 50% stenosis, EF 55%.  Patient does occasional has right-sided chest discomfort, but resolved with burping. He has no exertional symptom. He continued to be active after retirement. He denies any recent palpitations. He presents today for outpatient echocardiogram given his previous Myoview showed mildly deep decreased EF of 47%. However during the echocardiogram, it was noted the patient has new atrial fibrillation. He was put on my scheduled to be seen. He says he used to have palpitation when he was younger and was instructed to cut back on caffeinated drinks and his palpitations resolved. He currently has no cardiac awareness of atrial fibrillation.   He does take carvedilol. His heart rate is well controlled despite being atrial fibrillation. He has never had a history of stroke and he denies any bleeding issues. We will start on 5 mg twice a day of eliquis. We will continue on current rate control medication. We will reassess with a nursing visit and EKG in 3 weeks, if he is still  in atrial fibrillation, we will attempt outpatient DC cardioversion. Otherwise patient is doing well. We will obtain fasting labs today include CBC, BMET, TSH, and lipid panel.    Past Medical History:  Diagnosis Date  . BPH (benign prostatic hyperplasia)   . CAD in native artery   . ED (erectile dysfunction)   . Hyperlipidemia   . Hypertension   . Osteoarthritis   . Thyroid disease     Past Surgical History:  Procedure Laterality Date  . CARDIAC CATHETERIZATION  11/09/13   non obstructive disease in all vessels.  Marland Kitchen LEFT HEART CATHETERIZATION WITH CORONARY ANGIOGRAM N/A 11/09/2013   Procedure: LEFT HEART CATHETERIZATION WITH CORONARY ANGIOGRAM;  Surgeon: Blane Ohara, MD;  Location[facility] C[facility] LAB;  Service: Cardiovascular;  Laterality: N/A;  . LOWER BACK SURGERY  1985  . NECK SURGERY  1999  . REPLACEMENT TOTAL KNEE  2005   LEFT KNEE    Current Medications: Outpatient Medications Prior to Visit  Medication Sig Dispense Refill  . atorvastatin (LIPITOR) 40 MG tablet Take 1 tablet (40 mg total) by mouth daily. 90 tablet 2  . carvedilol (COREG) 6.25 MG tablet Take 1 tablet (6.25 mg total) by mouth 2 (two) times daily. 60 tablet 11  . co-enzyme Q-10 50 MG capsule Take 100 mg by mouth daily. 2 TABLETS    . doxazosin (CARDURA) 4 MG tablet Take 4 mg by mouth daily. 1 TABLETS    . fexofenadine (ALLEGRA) 180 MG tablet Take 180 mg by mouth daily.    Marland Kitchen  Fish Oil-Cholecalciferol (FISH OIL + D3) 1000-1000 MG-UNIT CAPS Take 1,200 mg by mouth daily.    . Garlic (GARLIQUE PO) Take by mouth daily.    . multivitamin-iron-minerals-folic acid (CENTRUM) chewable tablet Chew 1 tablet by mouth daily.    Marland Kitchen SYNTHROID 100 MCG tablet Take 100 mcg by mouth daily before breakfast.     . traZODone (DESYREL) 50 MG tablet Take 50 mg by mouth at bedtime.     . valsartan-hydrochlorothiazide (DIOVAN-HCT) 320-25 MG per tablet Take 1 tablet by mouth daily.    Marland Kitchen zolpidem (AMBIEN) 10 MG tablet Take 10 mg by mouth at  bedtime.     Marland Kitchen aspirin 81 MG tablet Take 81 mg by mouth daily. 1 TABLET     No facility-administered medications prior to visit.      Allergies:   Review of patient's allergies indicates no known allergies.   Social History   Social History  . Marital status: Married    Spouse name: N/A  . Number of children: N/A  . Years of education: N/A   Social History Main Topics  . Smoking status: Former Smoker    Quit date: 05/06/1999  . Smokeless tobacco: Former Systems developer    Quit date: 07/28/1995  . Alcohol use 0.6 oz/week    1 Cans of beer per week  . Drug use: No  . Sexual activity: Not Asked   Other Topics Concern  . None   Social History Narrative  . None     Family History:  The patient's family history includes Asthma in his mother; Heart attack in his father; Heart disease in his father; Hyperlipidemia in his brother; Parkinsonism in his mother; Prostate cancer in his father.   ROS:   Please see the history of present illness.    ROS All other systems reviewed and are negative.   PHYSICAL EXAM:   VS:  BP (!) 138/102   Pulse 70   Ht 5\' 10"  (1.778 m)   Wt 217 lb 8 oz (98.7 kg)   SpO2 99%   BMI 31.21 kg/m    GEN: Well nourished, well developed, in no acute distress  HEENT: normal  Neck: no JVD, carotid bruits, or masses Cardiac: irregular; no murmurs, rubs, or gallops,no edema  Respiratory:  clear to auscultation bilaterally, normal work of breathing GI: soft, nontender, nondistended, + BS MS: no deformity or atrophy  Skin: warm and dry, no rash Neuro:  Alert and Oriented x 3, Strength and sensation are intact Psych: euthymic mood, full affect  Wt Readings from Last 3 Encounters:  07/28/15 217 lb 8 oz (98.7 kg)  01/24/15 219 lb (99.3 kg)  07/19/14 210 lb (95.3 kg)      Studies/Labs Reviewed:   EKG:  EKG is ordered today.  The ekg ordered today demonstrates atrial fibrillation with HR 79  Recent Labs: 07/28/2015: TSH 1.86   Lipid Panel    Component Value  Date/Time   CHOL 124 (L) 07/28/2015 1042   TRIG 83 07/28/2015 1042   HDL 48 07/28/2015 1042   CHOLHDL 2.6 07/28/2015 1042   VLDL 17 07/28/2015 1042   LDLCALC 59 07/28/2015 1042    Additional studies/ records that were reviewed today include:  Echo 07/28/2015 LV EF: 55% -   60%  ------------------------------------------------------------------- Indications:      CHF (I50.9).  ------------------------------------------------------------------- History:   PMH:  Hyperlipidemia.  Coronary artery disease.  Risk factors:  Hypertension.  ------------------------------------------------------------------- Study Conclusions  - Left ventricle: The cavity size was  normal. Wall thickness was   increased in a pattern of mild LVH. Systolic function was normal.   The estimated ejection fraction was in the range of 55% to 60%.   Indeterminant diastolic function, atrial fibrillation. - Aortic valve: There was no stenosis. - Mitral valve: There was no significant regurgitation. - Right ventricle: The cavity size was normal. Systolic function   was normal. - Tricuspid valve: Peak RV-RA gradient (S): 24 mm Hg. - Pulmonary arteries: PA peak pressure: 27 mm Hg (S). - Inferior vena cava: The vessel was normal in size. The   respirophasic diameter changes were in the normal range (= 50%),   consistent with normal central venous pressure.  Impressions:  - The patient was in atrial fibrillation. Normal LV size with mild   LV hypertrophy. EF 55-60%. Normal RV size and systolic function.   No significant valvular abnormalities.    ASSESSMENT:    1. Paroxysmal atrial fibrillation (HCC)   2. Essential hypertension   3. Coronary artery disease involving native coronary artery of native heart without angina pectoris   4. Hyperlipidemia   5. CKD (chronic kidney disease), stage III   6. Hypothyroidism, unspecified hypothyroidism type      PLAN:  In order of problems listed  above:  1. New atrial fibrillation: rate controlled - CHA2DS2-Vasc score 3 (HTN, CAD, age) - continue rate control with coreg, obtain TSH. Nursing visit with EKG in 3 weeks, if still in afib, will plan for outpatient DCCV - check TSH   2. CAD  - Cath 11/09/2013 20-30% mid left main lesion, 30-40% stenosis in the ostial D1, 30-40% stenosis in mid LAD, 40% mid left circumflex stenosis, tiny OM branch with 50% stenosis, EF 55%.  - will stop ASA given the need for eliquis. Check BMET, CBC, lipid test (he is fasting today).  3. HTN: BP stable. Although he had h/o HR 43 in July 2016, his current HR is stable, may consider increase coreg on followup.  4. HLD: on lipitor  5. Hypothyroidism: on synthroid  6. CKD stage III: recheck BMET   Medication Adjustments/Labs and Tests Ordered: Current medicines are reviewed at length with the patient today.  Concerns regarding medicines are outlined above.  Medication changes, Labs and Tests ordered today are listed in the Patient Instructions below. Patient Instructions  Medication Instructions:  STOP ASPIRIN START Eliquis Take 1 tab (5mg ) by mouth twice a day  Labwork: TODAY: BMET, CBC, TSH, LIPID  Testing/Procedures: Complete a EKG in 3 weeks  Follow-up: 1. Nurse visit in 3 weeks for EKG   Any Other Special Instructions Will Be Listed Below (If Applicable).     If you need a refill on your cardiac medications before your next appointment, please call your pharmacy.      Hilbert Corrigan, [place]  07/28/2015 5:22 PM    [facility] [place], [place], [place]  [address] Phone: [phone]; Fax: [phone]

## 2015-08-02 DIAGNOSIS — D2271 Melanocytic nevi of right lower limb, including hip: Secondary | ICD-10-CM | POA: Diagnosis not present

## 2015-08-02 DIAGNOSIS — D225 Melanocytic nevi of trunk: Secondary | ICD-10-CM | POA: Diagnosis not present

## 2015-08-02 DIAGNOSIS — L821 Other seborrheic keratosis: Secondary | ICD-10-CM | POA: Diagnosis not present

## 2015-08-02 DIAGNOSIS — D1801 Hemangioma of skin and subcutaneous tissue: Secondary | ICD-10-CM | POA: Diagnosis not present

## 2015-08-02 DIAGNOSIS — D2272 Melanocytic nevi of left lower limb, including hip: Secondary | ICD-10-CM | POA: Diagnosis not present

## 2015-08-02 DIAGNOSIS — D2362 Other benign neoplasm of skin of left upper limb, including shoulder: Secondary | ICD-10-CM | POA: Diagnosis not present

## 2015-08-17 ENCOUNTER — Ambulatory Visit (INDEPENDENT_AMBULATORY_CARE_PROVIDER_SITE_OTHER): Payer: PPO | Admitting: *Deleted

## 2015-08-17 DIAGNOSIS — I480 Paroxysmal atrial fibrillation: Secondary | ICD-10-CM

## 2015-08-17 NOTE — Progress Notes (Signed)
1.) Reason for visit: EKG, BP 128/80. Pt denies any symptoms.  2.) Name of MD requesting visit: Nahser,Philip  3.) H&P: CAD, A-fibrillation  4.) ROS related to problem: Paroxysmal Atrial fibrillation.  5.) Assessment and plan per MD: Dr Acie Fredrickson reviewed the EKG, sinus bradycardia 55 beats/minute. Pt to continue with medications as scheduled.

## 2015-08-17 NOTE — Patient Instructions (Signed)
Pt to continue with taken his  medications as scheduled. Pt verbalized understanding.

## 2015-08-23 ENCOUNTER — Encounter: Payer: Self-pay | Admitting: Cardiovascular Disease

## 2015-08-24 ENCOUNTER — Other Ambulatory Visit: Payer: Self-pay | Admitting: Cardiovascular Disease

## 2015-09-07 ENCOUNTER — Encounter (INDEPENDENT_AMBULATORY_CARE_PROVIDER_SITE_OTHER): Payer: Self-pay

## 2015-09-07 ENCOUNTER — Ambulatory Visit (INDEPENDENT_AMBULATORY_CARE_PROVIDER_SITE_OTHER): Payer: PPO | Admitting: Cardiovascular Disease

## 2015-09-07 ENCOUNTER — Encounter: Payer: Self-pay | Admitting: Cardiovascular Disease

## 2015-09-07 VITALS — BP 140/90 | HR 54 | Ht 70.0 in | Wt 218.6 lb

## 2015-09-07 DIAGNOSIS — E785 Hyperlipidemia, unspecified: Secondary | ICD-10-CM | POA: Diagnosis not present

## 2015-09-07 DIAGNOSIS — I10 Essential (primary) hypertension: Secondary | ICD-10-CM | POA: Diagnosis not present

## 2015-09-07 DIAGNOSIS — I2510 Atherosclerotic heart disease of native coronary artery without angina pectoris: Secondary | ICD-10-CM

## 2015-09-07 DIAGNOSIS — I5022 Chronic systolic (congestive) heart failure: Secondary | ICD-10-CM

## 2015-09-07 MED ORDER — AMLODIPINE BESYLATE 5 MG PO TABS: 5.0000 mg | ORAL_TABLET | Freq: Every day | ORAL | 11 refills | Status: DC

## 2015-09-07 NOTE — Progress Notes (Signed)
[name] Date of Birth  1945/07/02       [address], [address], [place], [place]  [address]   [place], [place]  [address] [place]   Fax  [phone]     Fax [phone]  Problem List: 1. Hypertension 2. Hyperlipidemia 3. Hypothyroidism 4. CAD -   Moderate CAD by cath 11/09/2013 5. Paroxsymal atrial fib:    History of Present Illness:  Robie is a 20 old gentleman with a history of hypertension, hyperlipidemia and a positive family history of cardiac disease. He was referred for possible stress testing given his risk factors.  He has not had any symptoms of CP or dyspnea.  He worked for the [employer] for 33 years - retired 2005.  Currently, he spends his time riding his Selinda Eon , plays golf, hunts deer, [place], squirrel, .  Walks about a mile each day.  Does all this without any CP or dyspnea.  Lifts light weights and uses resistance bands.    Grandfather died at age 51, father died at age 27. Uncle died in his 81's   12/02/2013:  Garrin presents today for followup. We performed a coronary calcium score which revealed significant calcification: Dense calcification distal LM Scattered calcification of mid and distal LAD and mid and distal RCA  Subsequent stress myoview was abnormal Overall Impression: Intermediate risk stress nuclear study with mild lateral wall ischemia and mildly depressed LV systolic function.  LV Ejection Fraction: 47%. LV Wall Motion: NL LV Function; NL Wall Motion  Jan. 13, 2016: Juvon is a 70 yo with an abnormal coronary calcium score - significant calcification: Dense calcification distal LM Scattered calcification of mid and distal LAD and mid and distal RCA Myoview was abnormal and he was referred for cath.   LM  moderate calcification. The vessel has 20-30% stenosis in its midportion. The ostium of the left main is widely  patent. The distal left main is widely patent.    (LAD): the LAD is medium in caliber. The vessel reaches the LV apex. The proximal vessel has diffuse irregularity with mild calcification.  D1 has 30-40% stenosis at its ostium. The mid LAD at that bifurcation also has 30-40% stenosis. The remaining portions of the LAD have mild nonobstructive disease. Left circumflex (LCx): the left circumflex is patent. The mid vessel has 40% stenosis. There is a tiny obtuse marginal branch that has 50% stenosis. The major OM branch is widely patent with minor nonobstructive disease. Right coronary artery (RCA): this is a dominant vessel. There is diffuse calcification and irregularity but there are no high-grade stenoses present. There is no more than about 20 or 30% stenosis which is most significant at the junction of the mid and distal RCA. The PDA and PLA branches are patent. Left ventriculography: Left ventricular systolic function is normal, LVEF is estimated at 55%  Feeling well.  No CP . The cath showed mild - moderate irregularities. He has not had any further issues  July 19, 2014:   Urbano is doing well.  No dizziness, no syncope  Has some ringing in his ears.  Takes his BP at home, readings are in the normal range.   Jan. 23, 2017: Arvind is seen back for a follow up visit. Walks on occasion .  Not as much exercise as in the summer . No angina .  No dyspnea.  BP is well controlled.   Sept. 6, 2017:  Merrel is seen back for follow up of newly diagnosed atrial fib.  The afib was diagnosed when he came for his echo - was seen incidentally on the echo. Has been started on Eliquis  No bleeding  Still exercising . Has been watching his salt .    Current Outpatient Prescriptions  Medication Sig Dispense Refill  . apixaban (ELIQUIS) 5 MG TABS tablet Take 1 tablet (5 mg total) by mouth 2 (two) times daily. 30 tablet 3  . atorvastatin (LIPITOR) 40 MG tablet Take 1 tablet (40 mg total) by mouth  daily. 90 tablet 3  . carvedilol (COREG) 6.25 MG tablet Take 1 tablet (6.25 mg total) by mouth 2 (two) times daily. 60 tablet 11  . co-enzyme Q-10 50 MG capsule Take 100 mg by mouth daily. 2 TABLETS    . doxazosin (CARDURA) 4 MG tablet Take 4 mg by mouth daily. 1 TABLETS    . fexofenadine (ALLEGRA) 180 MG tablet Take 180 mg by mouth daily.    . Fish Oil-Cholecalciferol (FISH OIL + D3) 1000-1000 MG-UNIT CAPS Take 1,200 mg by mouth daily.    . Garlic (GARLIQUE PO) Take by mouth daily.    . multivitamin-iron-minerals-folic acid (CENTRUM) chewable tablet Chew 1 tablet by mouth daily.    Marland Kitchen SYNTHROID 100 MCG tablet Take 100 mcg by mouth daily before breakfast.     . traZODone (DESYREL) 50 MG tablet Take 50 mg by mouth at bedtime.     . valsartan-hydrochlorothiazide (DIOVAN-HCT) 320-25 MG per tablet Take 1 tablet by mouth daily.    Marland Kitchen zolpidem (AMBIEN) 10 MG tablet Take 10 mg by mouth at bedtime.      No current facility-administered medications for this visit.      No Known Allergies  Past Medical History:  Diagnosis Date  . BPH (benign prostatic hyperplasia)   . CAD in native artery   . ED (erectile dysfunction)   . Hyperlipidemia   . Hypertension   . Osteoarthritis   . Thyroid disease     Past Surgical History:  Procedure Laterality Date  . CARDIAC CATHETERIZATION  11/09/13   non obstructive disease in all vessels.  Marland Kitchen LEFT HEART CATHETERIZATION WITH CORONARY ANGIOGRAM N/A 11/09/2013   Procedure: LEFT HEART CATHETERIZATION WITH CORONARY ANGIOGRAM;  Surgeon: Blane Ohara, MD;  Location: [facility] CATH LAB;  Service: Cardiovascular;  Laterality: N/A;  . LOWER BACK SURGERY  1985  . NECK SURGERY  1999  . REPLACEMENT TOTAL KNEE  2005   LEFT KNEE    History  Smoking Status  . Former Smoker  . Quit date: 05/06/1999  Smokeless Tobacco  . Former Systems developer  . Quit date: 07/28/1995    History  Alcohol Use  . 0.6 oz/week  . 1 Cans of beer per week    Family History  Problem Relation Age  of Onset  . Asthma Mother   . Parkinsonism Mother   . Heart disease Father   . Heart attack Father   . Prostate cancer Father   . Hyperlipidemia Brother     Reviw of Systems:  Reviewed in the HPI.  All other systems are negative.  Physical Exam: Blood pressure 140/90, pulse (!) 54, height 5\' 10"  (1.778 m), weight 218 lb 9.6 oz (99.2 kg). Wt Readings from Last 3 Encounters:  09/07/15 218 lb 9.6 oz (99.2 kg)  08/17/15 218 lb 12 oz (99.2 kg)  07/28/15 217 lb 8  oz (98.7 kg)     General: Well developed, well nourished, in no acute distress.  Head: Normocephalic, atraumatic, sclera non-icteric, mucus membranes are moist,   Neck: Supple. Carotids are 2 + without bruits. No JVD   Lungs: Clear   Heart: RR, normal S1S2  Abdomen: Soft, non-tender, non-distended with normal bowel sounds.  Msk:  Strength and tone are normal   Extremities: No clubbing or cyanosis. No edema.  Distal pedal pulses are 2+ and equal   Neuro: CN II - XII intact.  Alert and oriented X 3.   Psych:  Normal  ECG:   Sept. 6, 2017:  Sinus brady at 54.   LAD   Assessment / Plan:   1. Hypertension - his blood pressures  Has been mildly elevated. Will add amlodipine .    2. Hyperlipidemia - stable ,  Continue meds.   3. Hypothyroidism  4. CAD -   Moderate CAD by cath 11/09/2013, no angina   5. Mild chronic systolic CHF:   Has now resolved.   EF 47% by myoview in Nov. 2015.   His echocardiogram July, 2017  revealed that his left ventricle systolic function has normalized..   Will get an echo in 6 months with an office visit the week after.   Mertie Moores, MD  09/07/2015 9:48 AM    [address],  [place], [place]  [address] Pager [phone] Phone: [phone]; Fax: [phone]   [facility]  [address] [place], [place]  [address] [phone]    Fax [phone]

## 2015-09-07 NOTE — Patient Instructions (Signed)
Medication Instructions:  START Amlodipine (Norvasc) 5 mg once daily   Labwork: None Ordered   Testing/Procedures: None Ordered   Follow-Up: Your physician wants you to follow-up in: 6 months with Dr. Acie Fredrickson.  You will receive a reminder letter in the mail two months in advance. If you don't receive a letter, please call our office to schedule the follow-up appointment.   If you need a refill on your cardiac medications before your next appointment, please call your pharmacy.   Thank you for choosing [facility]! Christen Bame, RN [phone]

## 2015-10-05 ENCOUNTER — Encounter: Payer: Self-pay | Admitting: Cardiovascular Disease

## 2015-10-05 DIAGNOSIS — H25099 Other age-related incipient cataract, unspecified eye: Secondary | ICD-10-CM | POA: Diagnosis not present

## 2015-10-26 DIAGNOSIS — Z23 Encounter for immunization: Secondary | ICD-10-CM | POA: Diagnosis not present

## 2015-10-31 DIAGNOSIS — M65351 Trigger finger, right little finger: Secondary | ICD-10-CM | POA: Diagnosis not present

## 2015-11-16 DIAGNOSIS — I129 Hypertensive chronic kidney disease with stage 1 through stage 4 chronic kidney disease, or unspecified chronic kidney disease: Secondary | ICD-10-CM | POA: Diagnosis not present

## 2015-11-16 DIAGNOSIS — R7303 Prediabetes: Secondary | ICD-10-CM | POA: Diagnosis not present

## 2015-11-16 DIAGNOSIS — N5201 Erectile dysfunction due to arterial insufficiency: Secondary | ICD-10-CM | POA: Diagnosis not present

## 2015-11-16 DIAGNOSIS — E039 Hypothyroidism, unspecified: Secondary | ICD-10-CM | POA: Diagnosis not present

## 2015-11-16 DIAGNOSIS — I480 Paroxysmal atrial fibrillation: Secondary | ICD-10-CM | POA: Diagnosis not present

## 2015-11-16 DIAGNOSIS — N183 Chronic kidney disease, stage 3 (moderate): Secondary | ICD-10-CM | POA: Diagnosis not present

## 2015-11-16 DIAGNOSIS — Z125 Encounter for screening for malignant neoplasm of prostate: Secondary | ICD-10-CM | POA: Diagnosis not present

## 2015-11-16 DIAGNOSIS — I2510 Atherosclerotic heart disease of native coronary artery without angina pectoris: Secondary | ICD-10-CM | POA: Diagnosis not present

## 2015-11-16 DIAGNOSIS — N400 Enlarged prostate without lower urinary tract symptoms: Secondary | ICD-10-CM | POA: Diagnosis not present

## 2015-11-16 DIAGNOSIS — E784 Other hyperlipidemia: Secondary | ICD-10-CM | POA: Diagnosis not present

## 2015-11-16 DIAGNOSIS — M1990 Unspecified osteoarthritis, unspecified site: Secondary | ICD-10-CM | POA: Diagnosis not present

## 2015-11-16 DIAGNOSIS — Z0000 Encounter for general adult medical examination without abnormal findings: Secondary | ICD-10-CM | POA: Diagnosis not present

## 2015-11-29 DIAGNOSIS — M65351 Trigger finger, right little finger: Secondary | ICD-10-CM | POA: Diagnosis not present

## 2015-11-29 DIAGNOSIS — S63642A Sprain of metacarpophalangeal joint of left thumb, initial encounter: Secondary | ICD-10-CM | POA: Diagnosis not present

## 2016-01-03 DIAGNOSIS — M25552 Pain in left hip: Secondary | ICD-10-CM | POA: Diagnosis not present

## 2016-01-03 DIAGNOSIS — M25512 Pain in left shoulder: Secondary | ICD-10-CM | POA: Diagnosis not present

## 2016-01-09 DIAGNOSIS — M25512 Pain in left shoulder: Secondary | ICD-10-CM | POA: Diagnosis not present

## 2016-01-13 IMAGING — CT CT HEART SCORING
1 of 3 series · 10 of 20 positions shown, 13 images · non-contrast
Comparison: None.

CLINICAL DATA: Risk stratification

EXAM:
Coronary Calcium Score
TECHNIQUE: The patient was scanned on a Siemens Sensation 16 slice scanner.
Axial non-contrast 3mm slices were carried out through the heart.
The data set was analyzed on a dedicated work station and scored
using the Agatson method.

[Series 6: st thins for reformat · axial · 0.70mm/px · z∈[-241,-124]mm · 10 of 143 slices shown, 13 images]
[im 13/143  vessel]
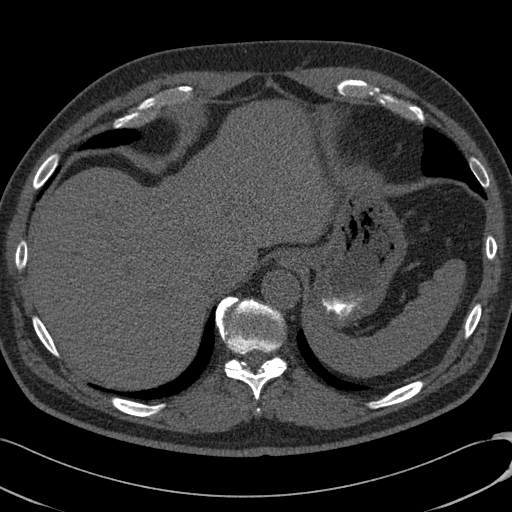
[im 13/143  lung]
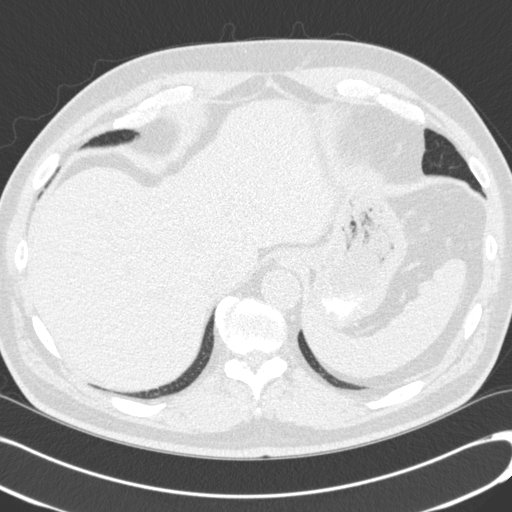
[im 26/143  vessel]
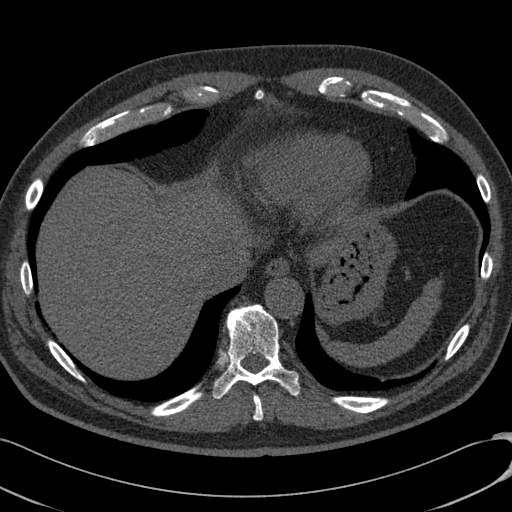
[im 39/143  vessel]
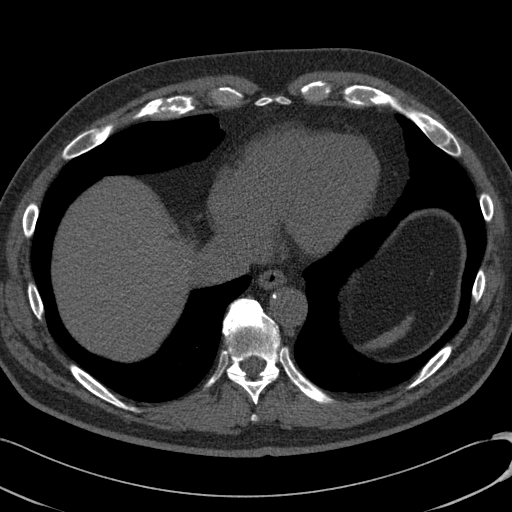
[im 52/143  vessel]
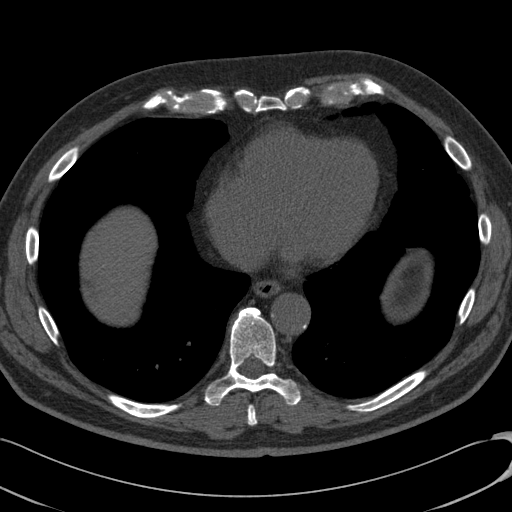
[im 65/143  vessel]
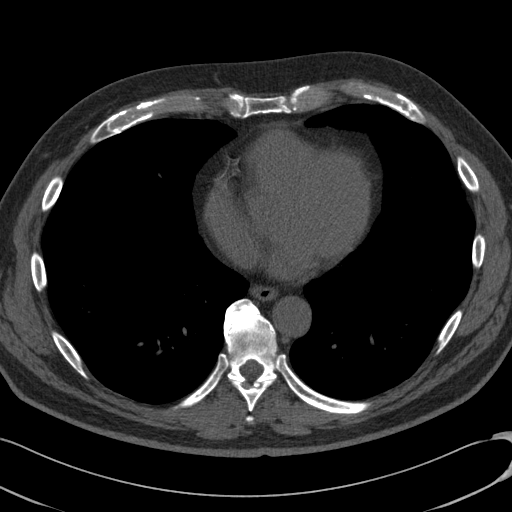
[im 65/143  lung]
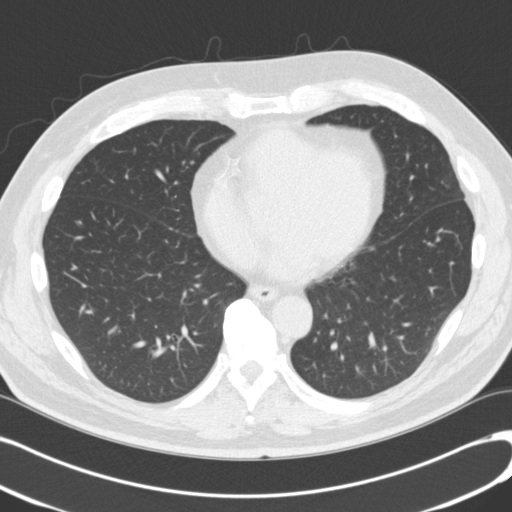
[im 78/143  vessel]
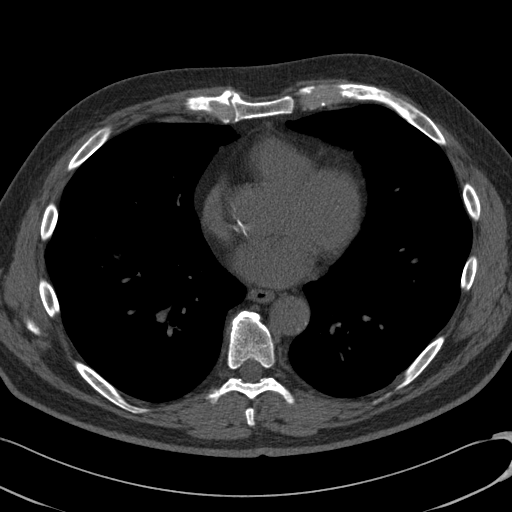
[im 91/143  vessel]
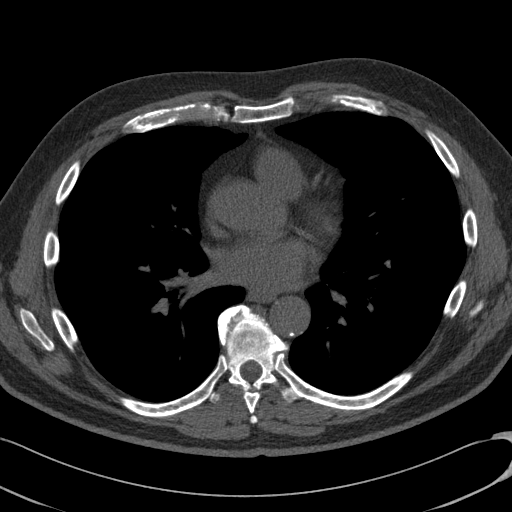
[im 104/143  vessel]
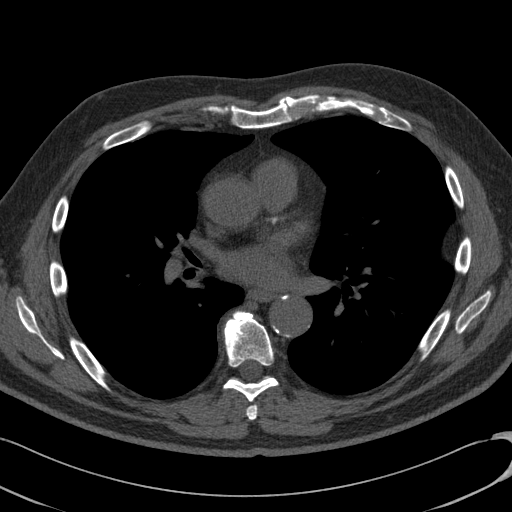
[im 117/143  vessel]
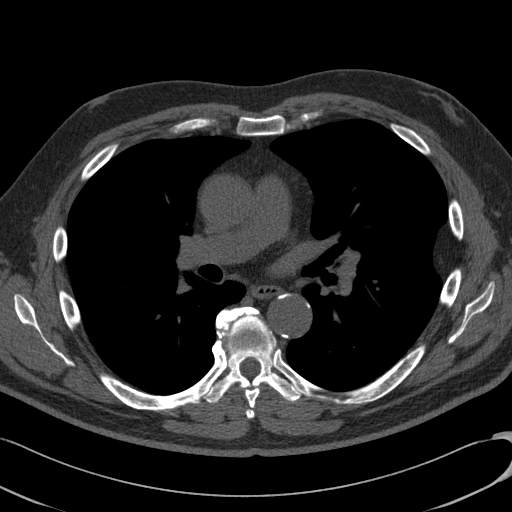
[im 117/143  lung]
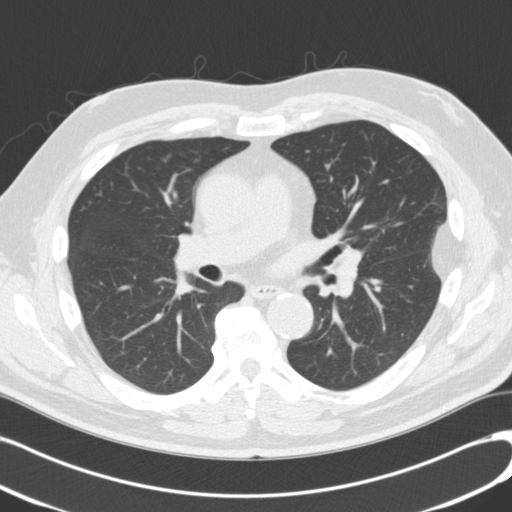
[im 130/143  vessel]
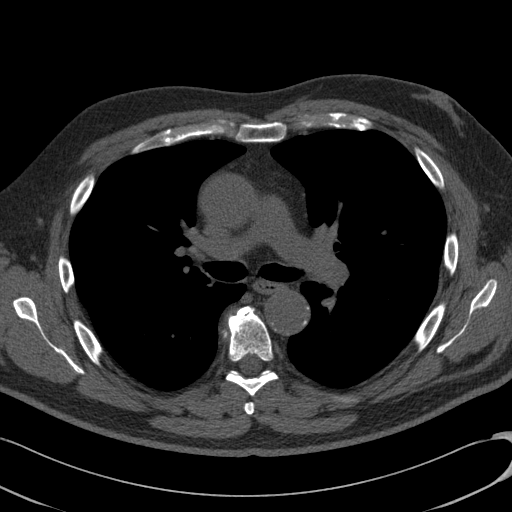

[10 of 20 positions shown; findings below may reference images not displayed]

FINDINGS: Non-cardiac: No significant non cardiac findings on limited lung and
soft tissue windows. See separate report from [REDACTED].
Sub pleural lipoma

Ascending Aorta:  3.7 cm

Pericardium: Normal

Coronary arteries: Dense calcification distal LM Scattered
calcification of mid and distal LAD and mid and distal RCA
IMPRESSION: Coronary calcium score of 77. This was 46th percentile for age and
sex matched control.

[place]

EXAM:
OVER-READ INTERPRETATION  CT CHEST

The following report is an over-read performed by radiologist Dr.
Mubeen Fasial [REDACTED] on 10/27/2013. This
over-read does not include interpretation of cardiac or coronary
anatomy or pathology. The coronary calcium score/ interpretation by
the cardiologist is attached.
FINDINGS: Visualized lungs are essentially clear. Mild compressive atelectasis
in the lingula. No suspicious pulmonary nodules.

Chronic subpleural opacity in the left hemithorax on prior chest
radiographs corresponds to a fat density lesion on CT, reflecting a
3.7 x 2.0 cm subpleural lipoma (series 3/image 9), benign.

Visualized soft tissues are otherwise unremarkable. No suspicious
thoracic lymphadenopathy.

Degenerative changes of the visualized thoracic spine. No focal
osseous lesions.
IMPRESSION: No significant extracardiac findings.

Benign subpleural lipoma along the left lateral chest wall, chronic.

## 2016-01-21 ENCOUNTER — Other Ambulatory Visit: Payer: Self-pay | Admitting: Cardiovascular Disease

## 2016-02-02 DIAGNOSIS — M1612 Unilateral primary osteoarthritis, left hip: Secondary | ICD-10-CM | POA: Diagnosis not present

## 2016-03-01 ENCOUNTER — Ambulatory Visit (INDEPENDENT_AMBULATORY_CARE_PROVIDER_SITE_OTHER): Payer: PPO | Admitting: Cardiovascular Disease

## 2016-03-01 ENCOUNTER — Encounter: Payer: Self-pay | Admitting: Cardiovascular Disease

## 2016-03-01 VITALS — BP 124/84 | HR 60 | Ht 70.0 in | Wt 220.0 lb

## 2016-03-01 DIAGNOSIS — E782 Mixed hyperlipidemia: Secondary | ICD-10-CM | POA: Diagnosis not present

## 2016-03-01 DIAGNOSIS — I480 Paroxysmal atrial fibrillation: Secondary | ICD-10-CM

## 2016-03-01 DIAGNOSIS — I2510 Atherosclerotic heart disease of native coronary artery without angina pectoris: Secondary | ICD-10-CM | POA: Diagnosis not present

## 2016-03-01 LAB — COMPREHENSIVE METABOLIC PANEL
A/G RATIO: 2.2 (ref 1.2–2.2)
ALT: 34 IU/L (ref 0–44)
AST: 26 IU/L (ref 0–40)
Albumin: 4.3 g/dL (ref 3.6–4.8)
Alkaline Phosphatase: 56 IU/L (ref 39–117)
BILIRUBIN TOTAL: 0.6 mg/dL (ref 0.0–1.2)
BUN/Creatinine Ratio: 14 (ref 10–24)
BUN: 19 mg/dL (ref 8–27)
CALCIUM: 9 mg/dL (ref 8.6–10.2)
CHLORIDE: 101 mmol/L (ref 96–106)
CO2: 23 mmol/L (ref 18–29)
Creatinine, Ser: 1.33 mg/dL — ABNORMAL HIGH (ref 0.76–1.27)
GFR calc Af Amer: 63 mL/min/{1.73_m2} (ref >59)
GFR, EST NON AFRICAN AMERICAN: 54 mL/min/{1.73_m2} — AB (ref >59)
Globulin, Total: 2 g/dL (ref 1.5–4.5)
Glucose: 97 mg/dL (ref 65–99)
POTASSIUM: 3.6 mmol/L (ref 3.5–5.2)
Sodium: 140 mmol/L (ref 134–144)
Total Protein: 6.3 g/dL (ref 6.0–8.5)

## 2016-03-01 LAB — LIPID PANEL
CHOL/HDL RATIO: 2.9 ratio (ref 0.0–5.0)
Cholesterol, Total: 129 mg/dL (ref 100–199)
HDL: 44 mg/dL (ref >39)
LDL CALC: 69 mg/dL (ref 0–99)
TRIGLYCERIDES: 82 mg/dL (ref 0–149)
VLDL Cholesterol Cal: 16 mg/dL (ref 5–40)

## 2016-03-01 NOTE — Patient Instructions (Signed)
Medication Instructions:  Your physician recommends that you continue on your current medications as directed. Please refer to the Current Medication list given to you today.   Labwork: TODAY - complete metabolic panel, cholesterol   Testing/Procedures: None Ordered   Follow-Up: Your physician wants you to follow-up in: 6 months with Dr. Nahser.  You will receive a reminder letter in the mail two months in advance. If you don't receive a letter, please call our office to schedule the follow-up appointment.   If you need a refill on your cardiac medications before your next appointment, please call your pharmacy.   Thank you for choosing [facility]! Michelle Swinyer, RN [phone]    

## 2016-03-01 NOTE — Progress Notes (Signed)
Logan Sanders Date of Birth  1945/07/02       [address], [address], [place], [place]  [address]   [place], [place]  [address] [place]   Fax  [phone]     Fax [phone]  Problem List: 1. Hypertension 2. Hyperlipidemia 3. Hypothyroidism 4. CAD -   Moderate CAD by cath 11/09/2013 5. Paroxsymal atrial fib:    History of Present Illness:  Logan Sanders is a 71 old gentleman with a history of hypertension, hyperlipidemia and a positive family history of cardiac disease. He was referred for possible stress testing given his risk factors.  He has not had any symptoms of CP or dyspnea.  He worked for the [employer] for 33 years - retired 2005.  Currently, he spends his time riding his [employer] Eon , plays golf, hunts deer, [place], squirrel, .  Walks about a mile each day.  Does all this without any CP or dyspnea.  Lifts light weights and uses resistance bands.    Grandfather died at age 51, father died at age 27. Uncle died in his 81's   12/02/2013:  Logan Sanders presents today for followup. We performed a coronary calcium score which revealed significant calcification: Dense calcification distal LM Scattered calcification of mid and distal LAD and mid and distal RCA  Subsequent stress myoview was abnormal Overall Impression: Intermediate risk stress nuclear study with mild lateral wall ischemia and mildly depressed LV systolic function.  LV Ejection Fraction: 47%. LV Wall Motion: NL LV Function; NL Wall Motion  Jan. 13, 2016: Logan Sanders is a 71 yo with an abnormal coronary calcium score - significant calcification: Dense calcification distal LM Scattered calcification of mid and distal LAD and mid and distal RCA Myoview was abnormal and he was referred for cath.   LM  moderate calcification. The vessel has 20-30% stenosis in its midportion. The ostium of the left main is widely  patent. The distal left main is widely patent.    (LAD): the LAD is medium in caliber. The vessel reaches the LV apex. The proximal vessel has diffuse irregularity with mild calcification.  D1 has 30-40% stenosis at its ostium. The mid LAD at that bifurcation also has 30-40% stenosis. The remaining portions of the LAD have mild nonobstructive disease. Left circumflex (LCx): the left circumflex is patent. The mid vessel has 40% stenosis. There is a tiny obtuse marginal branch that has 50% stenosis. The major OM branch is widely patent with minor nonobstructive disease. Right coronary artery (RCA): this is a dominant vessel. There is diffuse calcification and irregularity but there are no high-grade stenoses present. There is no more than about 20 or 30% stenosis which is most significant at the junction of the mid and distal RCA. The PDA and PLA branches are patent. Left ventriculography: Left ventricular systolic function is normal, LVEF is estimated at 55%  Feeling well.  No CP . The cath showed mild - moderate irregularities. He has not had any further issues  July 19, 2014:   Logan Sanders is doing well.  No dizziness, no syncope  Has some ringing in his ears.  Takes his BP at home, readings are in the normal range.   Jan. 23, 2017: Logan Sanders is seen back for a follow up visit. Walks on occasion .  Not as much exercise as in the summer . No angina .  No dyspnea.  BP is well controlled.   Sept. 6, 2017:  Logan Sanders is seen back for follow up of newly diagnosed atrial fib.  The afib was diagnosed when he came for his echo - was seen incidentally on the echo. Has been started on Eliquis  No bleeding  Still exercising . Has been watching his salt .   March 01, 2016:    Seen back for his mild CAD and Paroxysmal atrial fib  Cannot tell when he is in Atrial fib. Has some indigestion.  Belches and feels better  Not exercising much.  Weather has been challenging  No CP or dyspnea doing his regular  activities.   Current Outpatient Prescriptions  Medication Sig Dispense Refill  . amLODipine (NORVASC) 5 MG tablet Take 1 tablet (5 mg total) by mouth daily. 30 tablet 11  . apixaban (ELIQUIS) 5 MG TABS tablet Take 1 tablet (5 mg total) by mouth 2 (two) times daily. 30 tablet 3  . atorvastatin (LIPITOR) 40 MG tablet Take 1 tablet (40 mg total) by mouth daily. 90 tablet 3  . carvedilol (COREG) 6.25 MG tablet Take 1 tablet (6.25 mg total) by mouth 2 (two) times daily. 60 tablet 7  . co-enzyme Q-10 50 MG capsule Take 100 mg by mouth daily. 2 TABLETS    . doxazosin (CARDURA) 4 MG tablet Take 4 mg by mouth daily. 1 TABLETS    . fexofenadine (ALLEGRA) 180 MG tablet Take 180 mg by mouth daily.    . Fish Oil-Cholecalciferol (FISH OIL + D3) 1000-1000 MG-UNIT CAPS Take 1,200 mg by mouth daily.    . Garlic (GARLIQUE PO) Take by mouth daily.    . multivitamin-iron-minerals-folic acid (CENTRUM) chewable tablet Chew 1 tablet by mouth daily.    Marland Kitchen SYNTHROID 100 MCG tablet Take 100 mcg by mouth daily before breakfast.     . traZODone (DESYREL) 50 MG tablet Take 50 mg by mouth at bedtime.     . valsartan-hydrochlorothiazide (DIOVAN-HCT) 320-25 MG per tablet Take 1 tablet by mouth daily.    Marland Kitchen zolpidem (AMBIEN) 10 MG tablet Take 10 mg by mouth at bedtime.      No current facility-administered medications for this visit.      No Known Allergies  Past Medical History:  Diagnosis Date  . BPH (benign prostatic hyperplasia)   . CAD in native artery   . ED (erectile dysfunction)   . Hyperlipidemia   . Hypertension   . Osteoarthritis   . Thyroid disease     Past Surgical History:  Procedure Laterality Date  . CARDIAC CATHETERIZATION  11/09/13   non obstructive disease in all vessels.  Marland Kitchen LEFT HEART CATHETERIZATION WITH CORONARY ANGIOGRAM N/A 11/09/2013   Procedure: LEFT HEART CATHETERIZATION WITH CORONARY ANGIOGRAM;  Surgeon: Blane Ohara, MD;  Location: [facility] CATH LAB;  Service: Cardiovascular;   Laterality: N/A;  . LOWER BACK SURGERY  1985  . NECK SURGERY  1999  . REPLACEMENT TOTAL KNEE  2005   LEFT KNEE    History  Smoking Status  . Former Smoker  . Quit date: 05/06/1999  Smokeless Tobacco  . Former Systems developer  . Quit date: 07/28/1995    History  Alcohol Use  . 0.6 oz/week  . 1 Cans of beer per week    Family History  Problem Relation Age of Onset  . Asthma Mother   . Parkinsonism Mother   . Heart disease Father   . Heart attack Father   . Prostate cancer Father   .  Hyperlipidemia Brother     Reviw of Systems:  Reviewed in the HPI.  All other systems are negative.  Physical Exam: Blood pressure 124/84, pulse 60, height 5\' 10"  (1.778 m), weight 220 lb (99.8 kg), SpO2 94 %. Wt Readings from Last 3 Encounters:  03/01/16 220 lb (99.8 kg)  09/07/15 218 lb 9.6 oz (99.2 kg)  08/17/15 218 lb 12 oz (99.2 kg)     General: Well developed, well nourished, in no acute distress.  Head: Normocephalic, atraumatic, sclera non-icteric, mucus membranes are moist,   Neck: Supple. Carotids are 2 + without bruits. No JVD   Lungs: Clear   Heart: RR, normal S1S2  Abdomen: Soft, non-tender, non-distended with normal bowel sounds.  Msk:  Strength and tone are normal   Extremities: No clubbing or cyanosis. No edema.  Distal pedal pulses are 2+ and equal   Neuro: CN II - XII intact.  Alert and oriented X 3.   Psych:  Normal  ECG:   Sept. 6, 2017:  Sinus brady at 54.   LAD   Assessment / Plan:   1. Hypertension - his blood pressures Currently well-controlled. Continue current medications.  2. Hyperlipidemia - stable ,  Continue meds.   3. Hypothyroidism  4. CAD -   Moderate CAD by cath 11/09/2013, no angina   5. Mild chronic systolic CHF:   Has now resolved.   EF 47% by myoview in Nov. 2015.   His echocardiogram July, 2017  revealed that his left ventricle systolic function has normalized..   6. Paroxysmal atrial fibrillation:   Continue Eliquis  He continues to do  very well.  Mertie Moores, MD  03/01/2016 9:57 AM    [address],  [place], [place]  [address] Pager [phone] Phone: [phone]; Fax: [phone]

## 2016-03-12 ENCOUNTER — Ambulatory Visit: Payer: PPO | Admitting: Cardiovascular Disease

## 2016-03-12 DIAGNOSIS — M5416 Radiculopathy, lumbar region: Secondary | ICD-10-CM | POA: Diagnosis not present

## 2016-04-02 ENCOUNTER — Telehealth: Payer: Self-pay | Admitting: Cardiovascular Disease

## 2016-04-02 NOTE — Telephone Encounter (Signed)
New Message °

## 2016-04-11 DIAGNOSIS — M5416 Radiculopathy, lumbar region: Secondary | ICD-10-CM | POA: Diagnosis not present

## 2016-05-15 DIAGNOSIS — N183 Chronic kidney disease, stage 3 (moderate): Secondary | ICD-10-CM | POA: Diagnosis not present

## 2016-05-15 DIAGNOSIS — E039 Hypothyroidism, unspecified: Secondary | ICD-10-CM | POA: Diagnosis not present

## 2016-05-15 DIAGNOSIS — M542 Cervicalgia: Secondary | ICD-10-CM | POA: Diagnosis not present

## 2016-05-15 DIAGNOSIS — I129 Hypertensive chronic kidney disease with stage 1 through stage 4 chronic kidney disease, or unspecified chronic kidney disease: Secondary | ICD-10-CM | POA: Diagnosis not present

## 2016-05-15 DIAGNOSIS — L219 Seborrheic dermatitis, unspecified: Secondary | ICD-10-CM | POA: Diagnosis not present

## 2016-05-15 DIAGNOSIS — E784 Other hyperlipidemia: Secondary | ICD-10-CM | POA: Diagnosis not present

## 2016-05-15 DIAGNOSIS — I480 Paroxysmal atrial fibrillation: Secondary | ICD-10-CM | POA: Diagnosis not present

## 2016-05-15 DIAGNOSIS — I2510 Atherosclerotic heart disease of native coronary artery without angina pectoris: Secondary | ICD-10-CM | POA: Diagnosis not present

## 2016-05-15 DIAGNOSIS — N3941 Urge incontinence: Secondary | ICD-10-CM | POA: Diagnosis not present

## 2016-06-08 DIAGNOSIS — R0981 Nasal congestion: Secondary | ICD-10-CM | POA: Diagnosis not present

## 2016-06-08 DIAGNOSIS — I129 Hypertensive chronic kidney disease with stage 1 through stage 4 chronic kidney disease, or unspecified chronic kidney disease: Secondary | ICD-10-CM | POA: Diagnosis not present

## 2016-06-18 ENCOUNTER — Telehealth: Payer: Self-pay | Admitting: *Deleted

## 2016-06-18 NOTE — Telephone Encounter (Signed)
Patient called and requested eliquis samples or a copay card. He has medicare so he would not be able to use a copay card. I made him aware that I could place two weeks of samples at the front desk but we would not be able to do this ongoing as samples are intended for new starts. Patient verbalized his understanding and was appreciative for my assistance.

## 2016-08-17 ENCOUNTER — Other Ambulatory Visit: Payer: Self-pay | Admitting: Cardiovascular Disease

## 2016-08-21 DIAGNOSIS — I10 Essential (primary) hypertension: Secondary | ICD-10-CM | POA: Diagnosis not present

## 2016-08-21 DIAGNOSIS — M25552 Pain in left hip: Secondary | ICD-10-CM | POA: Diagnosis not present

## 2016-08-21 DIAGNOSIS — Z6832 Body mass index (BMI) 32.0-32.9, adult: Secondary | ICD-10-CM | POA: Diagnosis not present

## 2016-08-21 DIAGNOSIS — M545 Low back pain: Secondary | ICD-10-CM | POA: Diagnosis not present

## 2016-08-22 DIAGNOSIS — M25552 Pain in left hip: Secondary | ICD-10-CM | POA: Diagnosis not present

## 2016-08-22 DIAGNOSIS — M545 Low back pain: Secondary | ICD-10-CM | POA: Diagnosis not present

## 2016-08-27 NOTE — Telephone Encounter (Signed)
**Note De-Identified Logan Sanders Obfuscation** The pt did not pick up requested samples of Eliquis.   Placed back in closet.

## 2016-08-29 DIAGNOSIS — M545 Low back pain: Secondary | ICD-10-CM | POA: Diagnosis not present

## 2016-08-30 ENCOUNTER — Other Ambulatory Visit: Payer: Self-pay | Admitting: Cardiovascular Disease

## 2016-09-04 DIAGNOSIS — M48061 Spinal stenosis, lumbar region without neurogenic claudication: Secondary | ICD-10-CM | POA: Diagnosis not present

## 2016-09-04 DIAGNOSIS — M545 Low back pain: Secondary | ICD-10-CM | POA: Diagnosis not present

## 2016-09-14 ENCOUNTER — Encounter: Payer: Self-pay | Admitting: Cardiovascular Disease

## 2016-09-27 ENCOUNTER — Ambulatory Visit (INDEPENDENT_AMBULATORY_CARE_PROVIDER_SITE_OTHER): Payer: PPO | Admitting: Cardiovascular Disease

## 2016-09-27 ENCOUNTER — Encounter: Payer: Self-pay | Admitting: Cardiovascular Disease

## 2016-09-27 ENCOUNTER — Other Ambulatory Visit: Payer: Self-pay | Admitting: Physician Assistant

## 2016-09-27 VITALS — BP 128/68 | HR 57 | Ht 70.0 in | Wt 226.2 lb

## 2016-09-27 DIAGNOSIS — R600 Localized edema: Secondary | ICD-10-CM | POA: Insufficient documentation

## 2016-09-27 DIAGNOSIS — I2510 Atherosclerotic heart disease of native coronary artery without angina pectoris: Secondary | ICD-10-CM

## 2016-09-27 DIAGNOSIS — I480 Paroxysmal atrial fibrillation: Secondary | ICD-10-CM

## 2016-09-27 DIAGNOSIS — I10 Essential (primary) hypertension: Secondary | ICD-10-CM

## 2016-09-27 NOTE — Progress Notes (Signed)
Logan Sanders Date of Birth  1945/07/02       [facility] [address], [address], [place], [place]  [address]   [place], [place]  [address] [place]   Fax  [phone]     Fax [phone]  Problem List: 1. Hypertension 2. Hyperlipidemia 3. Hypothyroidism 4. CAD -   Moderate CAD by cath 11/09/2013 5. Paroxsymal atrial fib:    History of Present Illness:  Logan Sanders is a 20 old gentleman with a history of hypertension, hyperlipidemia and a positive family history of cardiac disease. He was referred for possible stress testing given his risk factors.  He has not had any symptoms of CP or dyspnea.  He worked for the [employer] for 33 years - retired 2005.  Currently, he spends his time riding his Selinda Eon , plays golf, hunts deer, [place], squirrel, .  Walks about a mile each day.  Does all this without any CP or dyspnea.  Lifts light weights and uses resistance bands.    Grandfather died at age 51, father died at age 27. Uncle died in his 81's   12/02/2013:  Logan Sanders presents today for followup. We performed a coronary calcium score which revealed significant calcification: Dense calcification distal LM Scattered calcification of mid and distal LAD and mid and distal RCA  Subsequent stress myoview was abnormal Overall Impression: Intermediate risk stress nuclear study with mild lateral wall ischemia and mildly depressed LV systolic function.  LV Ejection Fraction: 47%. LV Wall Motion: NL LV Function; NL Wall Motion  Jan. 13, 2016: Logan Sanders is a 71 yo with an abnormal coronary calcium score - significant calcification: Dense calcification distal LM Scattered calcification of mid and distal LAD and mid and distal RCA Myoview was abnormal and he was referred for cath.   LM  moderate calcification. The vessel has 20-30% stenosis in its midportion. The ostium of the left main is widely  patent. The distal left main is widely patent.    (LAD): the LAD is medium in caliber. The vessel reaches the LV apex. The proximal vessel has diffuse irregularity with mild calcification.  D1 has 30-40% stenosis at its ostium. The mid LAD at that bifurcation also has 30-40% stenosis. The remaining portions of the LAD have mild nonobstructive disease. Left circumflex (LCx): the left circumflex is patent. The mid vessel has 40% stenosis. There is a tiny obtuse marginal branch that has 50% stenosis. The major OM branch is widely patent with minor nonobstructive disease. Right coronary artery (RCA): this is a dominant vessel. There is diffuse calcification and irregularity but there are no high-grade stenoses present. There is no more than about 20 or 30% stenosis which is most significant at the junction of the mid and distal RCA. The PDA and PLA branches are patent. Left ventriculography: Left ventricular systolic function is normal, LVEF is estimated at 55%  Feeling well.  No CP . The cath showed mild - moderate irregularities. He has not had any further issues  July 19, 2014:   Logan Sanders is doing well.  No dizziness, no syncope  Has some ringing in his ears.  Takes his BP at home, readings are in the normal range.   Jan. 23, 2017: Logan Sanders is seen back for a follow up visit. Walks on occasion .  Not as much exercise as in the summer . No angina .  No dyspnea.  BP is well controlled.   Sept. 6, 2017:  Logan Sanders is seen back for follow up of newly diagnosed atrial fib.  The afib was diagnosed when he came for his echo - was seen incidentally on the echo. Has been started on Eliquis  No bleeding  Still exercising . Has been watching his salt .   March 01, 2016:    Seen back for his mild CAD and Paroxysmal atrial fib  Cannot tell when he is in Atrial fib. Has some indigestion.  Belches and feels better  Not exercising much.  Weather has been challenging  No CP or dyspnea doing his regular  activities.   Sept. 27, 2018:   Logan Sanders is seen back for follow-up of his mild to moderate coronary artery disease and his paroxysmal atrial fibrillation. Has had some ankle swelling.   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  Is also on amlodipine ( which is contributing to the edema )    Current Outpatient Prescriptions  Medication Sig Dispense Refill  . amLODipine (NORVASC) 5 MG tablet TAKE 1 TABLET ONCE DAILY. 30 tablet 6  . apixaban (ELIQUIS) 5 MG TABS tablet Take 1 tablet (5 mg total) by mouth 2 (two) times daily. 30 tablet 3  . atorvastatin (LIPITOR) 40 MG tablet Take 1 tablet (40 mg total) by mouth daily. 90 tablet 1  . carvedilol (COREG) 6.25 MG tablet Take 1 tablet (6.25 mg total) by mouth 2 (two) times daily. 60 tablet 5  . co-enzyme Q-10 50 MG capsule Take 100 mg by mouth daily. 2 TABLETS    . doxazosin (CARDURA) 4 MG tablet Take 4 mg by mouth daily. 1 TABLETS    . fexofenadine (ALLEGRA) 180 MG tablet Take 180 mg by mouth daily.    . Fish Oil-Cholecalciferol (FISH OIL + D3) 1000-1000 MG-UNIT CAPS Take 1,200 mg by mouth daily.    . Garlic (GARLIQUE PO) Take by mouth daily.    . multivitamin-iron-minerals-folic acid (CENTRUM) chewable tablet Chew 1 tablet by mouth daily.    Marland Kitchen olmesartan-hydrochlorothiazide (BENICAR HCT) 40-12.5 MG tablet     . SHINGRIX injection As directed    . SYNTHROID 100 MCG tablet Take 100 mcg by mouth daily before breakfast.     . traZODone (DESYREL) 50 MG tablet Take 50 mg by mouth at bedtime.     Marland Kitchen zolpidem (AMBIEN) 10 MG tablet Take 10 mg by mouth at bedtime.      No current facility-administered medications for this visit.      No Known Allergies  Past Medical History:  Diagnosis Date  . BPH (benign prostatic hyperplasia)   . CAD in native artery   . ED (erectile dysfunction)   . Hyperlipidemia   . Hypertension   . Osteoarthritis   . Thyroid disease     Past Surgical History:  Procedure Laterality Date  .  CARDIAC CATHETERIZATION  11/09/13   non obstructive disease in all vessels.  Marland Kitchen LEFT HEART CATHETERIZATION WITH CORONARY ANGIOGRAM N/A 11/09/2013   Procedure: LEFT HEART CATHETERIZATION WITH CORONARY ANGIOGRAM;  Surgeon: Blane Ohara, MD;  Location: [facility] CATH LAB;  Service: Cardiovascular;  Laterality: N/A;  . LOWER BACK SURGERY  1985  . NECK SURGERY  1999  . REPLACEMENT TOTAL KNEE  2005   LEFT KNEE    History  Smoking Status  . Former Smoker  . Quit date: 05/06/1999  Smokeless Tobacco  . Former Systems developer  .  Quit date: 07/28/1995    History  Alcohol Use  . 0.6 oz/week  . 1 Cans of beer per week    Family History  Problem Relation Age of Onset  . Asthma Mother   . Parkinsonism Mother   . Heart disease Father   . Heart attack Father   . Prostate cancer Father   . Hyperlipidemia Brother     Reviw of Systems:  Reviewed in the HPI.  All other systems are negative.   ECG:   Sept. 27, 2018:   Sinus brady at 80.   LAD .   Assessment / Plan:   1. Hypertension - his blood pressures Currently well-controlled. Continue current medications. 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 He is also on amlodipine. We discussed having him ambulate a little bit more. We discussed compression hose and elevation at night. At present the leg edema is only very trivial.  2. Hyperlipidemia - stable ,  Continue meds.  We'll check lipids in 6 months.  4. CAD -   Moderate CAD by cath 11/09/2013, no angina   6. Paroxysmal atrial fibrillation:   Continue Eliquis  He continues to do very well. Has not clinically had any episodes of PAF   Mertie Moores, MD  09/27/2016 3:34 PM    [facility] [place],  [place], [place]  [address] Pager [phone] Phone: [phone]; Fax: [phone]

## 2016-09-27 NOTE — Patient Instructions (Signed)

## 2016-09-28 NOTE — Telephone Encounter (Signed)
Please review for refill, thanks ! 

## 2016-09-28 NOTE — Telephone Encounter (Signed)
Pt last saw Dr Acie Fredrickson 09/27/16, last labs 03/01/16 Creat 1.33, age 71, weight 102.6kg, based on specified criteria pt is on appropriate dosage of Eliquis 5mg  BID.  Will refill rx.

## 2016-10-01 DIAGNOSIS — I10 Essential (primary) hypertension: Secondary | ICD-10-CM | POA: Diagnosis not present

## 2016-10-01 DIAGNOSIS — M545 Low back pain: Secondary | ICD-10-CM | POA: Diagnosis not present

## 2016-10-01 DIAGNOSIS — M48061 Spinal stenosis, lumbar region without neurogenic claudication: Secondary | ICD-10-CM | POA: Diagnosis not present

## 2016-10-01 DIAGNOSIS — Z6832 Body mass index (BMI) 32.0-32.9, adult: Secondary | ICD-10-CM | POA: Diagnosis not present

## 2016-10-05 DIAGNOSIS — L738 Other specified follicular disorders: Secondary | ICD-10-CM | POA: Diagnosis not present

## 2016-10-05 DIAGNOSIS — L821 Other seborrheic keratosis: Secondary | ICD-10-CM | POA: Diagnosis not present

## 2016-10-05 DIAGNOSIS — D2271 Melanocytic nevi of right lower limb, including hip: Secondary | ICD-10-CM | POA: Diagnosis not present

## 2016-10-05 DIAGNOSIS — D485 Neoplasm of uncertain behavior of skin: Secondary | ICD-10-CM | POA: Diagnosis not present

## 2016-10-05 DIAGNOSIS — D1801 Hemangioma of skin and subcutaneous tissue: Secondary | ICD-10-CM | POA: Diagnosis not present

## 2016-10-05 DIAGNOSIS — D2362 Other benign neoplasm of skin of left upper limb, including shoulder: Secondary | ICD-10-CM | POA: Diagnosis not present

## 2016-10-05 DIAGNOSIS — D225 Melanocytic nevi of trunk: Secondary | ICD-10-CM | POA: Diagnosis not present

## 2016-10-09 DIAGNOSIS — H25099 Other age-related incipient cataract, unspecified eye: Secondary | ICD-10-CM | POA: Diagnosis not present

## 2016-10-23 DIAGNOSIS — M48061 Spinal stenosis, lumbar region without neurogenic claudication: Secondary | ICD-10-CM | POA: Diagnosis not present

## 2016-10-25 DIAGNOSIS — Z23 Encounter for immunization: Secondary | ICD-10-CM | POA: Diagnosis not present

## 2016-11-01 ENCOUNTER — Telehealth: Payer: Self-pay

## 2016-11-01 NOTE — Telephone Encounter (Signed)
Pt takes Eliquis for afib with CHADS2VASc score of 4 (age, HTN, CAD, CHF). SCr is 1.33. Ok to hold Eliquis for 3 days prior as directed. Clearance faxed to below number.

## 2016-11-01 NOTE — Telephone Encounter (Signed)
   [facility] Pre-operative Risk Assessment    Request for surgical clearance:  1. What type of surgery is being performed? Bilateral transforaminal ESI  2. When is this surgery scheduled? TBD   3. Are there any medications that need to be held prior to surgery and how long? Request to hold Eliquis 3 days prior to procedure   4. Practice name and name of physician performing surgery? [facility] // Dr. Brien Few   5. What is your office phone and fax number?  1. Fax: [phone]   6. Anesthesia type (None, local, MAC, general) ? Not specified   Theodoro Parma 11/01/2016, 11:28 AM  _________________________________________________________________

## 2016-11-02 ENCOUNTER — Telehealth: Payer: Self-pay | Admitting: Cardiovascular Disease

## 2016-11-02 NOTE — Telephone Encounter (Signed)
New message    Pt is calling about his clearance. He said that for his shot, he is to stop eliquis 3 days prior. So is that Sunday or Monday? His procedure is Wednesday morning

## 2016-11-02 NOTE — Telephone Encounter (Signed)
Spoke with pt and advised since his injection is scheduled for Wednesday am at 9, his last dose should been Saturday night-Hold Sunday, Monday and Tuesday-have injection Wednesday AM and restart according to Dr Lauris Poag instructions. (generally would be that evening).  Pt states understanding and thanked me for the call.

## 2016-11-07 DIAGNOSIS — M48061 Spinal stenosis, lumbar region without neurogenic claudication: Secondary | ICD-10-CM | POA: Diagnosis not present

## 2016-11-07 DIAGNOSIS — I10 Essential (primary) hypertension: Secondary | ICD-10-CM | POA: Diagnosis not present

## 2016-11-07 DIAGNOSIS — Z6832 Body mass index (BMI) 32.0-32.9, adult: Secondary | ICD-10-CM | POA: Diagnosis not present

## 2016-11-19 DIAGNOSIS — E7841 Elevated Lipoprotein(a): Secondary | ICD-10-CM | POA: Diagnosis not present

## 2016-11-19 DIAGNOSIS — R739 Hyperglycemia, unspecified: Secondary | ICD-10-CM | POA: Diagnosis not present

## 2016-11-19 DIAGNOSIS — N400 Enlarged prostate without lower urinary tract symptoms: Secondary | ICD-10-CM | POA: Diagnosis not present

## 2016-11-19 DIAGNOSIS — N5201 Erectile dysfunction due to arterial insufficiency: Secondary | ICD-10-CM | POA: Diagnosis not present

## 2016-11-19 DIAGNOSIS — E7849 Other hyperlipidemia: Secondary | ICD-10-CM | POA: Diagnosis not present

## 2016-11-19 DIAGNOSIS — G479 Sleep disorder, unspecified: Secondary | ICD-10-CM | POA: Diagnosis not present

## 2016-11-19 DIAGNOSIS — Z1389 Encounter for screening for other disorder: Secondary | ICD-10-CM | POA: Diagnosis not present

## 2016-11-19 DIAGNOSIS — Z0000 Encounter for general adult medical examination without abnormal findings: Secondary | ICD-10-CM | POA: Diagnosis not present

## 2016-11-19 DIAGNOSIS — I2510 Atherosclerotic heart disease of native coronary artery without angina pectoris: Secondary | ICD-10-CM | POA: Diagnosis not present

## 2016-11-19 DIAGNOSIS — I129 Hypertensive chronic kidney disease with stage 1 through stage 4 chronic kidney disease, or unspecified chronic kidney disease: Secondary | ICD-10-CM | POA: Diagnosis not present

## 2016-11-19 DIAGNOSIS — E039 Hypothyroidism, unspecified: Secondary | ICD-10-CM | POA: Diagnosis not present

## 2016-11-19 DIAGNOSIS — M1990 Unspecified osteoarthritis, unspecified site: Secondary | ICD-10-CM | POA: Diagnosis not present

## 2016-11-19 DIAGNOSIS — N183 Chronic kidney disease, stage 3 (moderate): Secondary | ICD-10-CM | POA: Diagnosis not present

## 2016-11-19 DIAGNOSIS — Z125 Encounter for screening for malignant neoplasm of prostate: Secondary | ICD-10-CM | POA: Diagnosis not present

## 2016-12-04 ENCOUNTER — Telehealth: Payer: Self-pay | Admitting: *Deleted

## 2016-12-04 NOTE — Telephone Encounter (Signed)
   Chart reviewed as part of pre-operative protocol coverage.   This question was answered in similar phone note from 11/01/16. Per Fuller Canada (pharmacist), "Pt takes Eliquis for afib with CHADS2VASc score of 4 (age, HTN, CAD, CHF). SCr is 1.33. Ok to hold Eliquis for 3 days prior as directed."  Will route this bundled recommendation to requesting provider via Epic fax function. Please call with questions.   Charlie Pitter, PA-C 12/04/2016, 4:08 PM

## 2016-12-04 NOTE — Telephone Encounter (Signed)
   [facility] Pre-operative Risk Assessment    Request for surgical clearance:  1. What type of surgery is being performed? ESI Lumbar  2. When is this surgery scheduled? 12/12/16  3. Are there any medications that need to be held prior to surgery and how long? Eliquis for 3 to 5 days prior  4. Practice name and name of physician performing surgery? [facility] - Dr. Marlaine Hind  5. What is your office phone and fax number? [phone]  Fax [phone]  6. Anesthesia type (None, local, MAC, general) ? Not specified   Logan Sanders 12/04/2016, 2:48 PM  _________________________________________________________________   (provider comments below)

## 2016-12-06 ENCOUNTER — Telehealth: Payer: Self-pay | Admitting: Cardiovascular Disease

## 2016-12-06 NOTE — Telephone Encounter (Signed)
New message   Patient calling the office for samples of medication:   1.  What medication and dosage are you requesting samples for?ELIQUIS 5 MG TABS tablet  2.  Are you currently out of this medication? yes   

## 2016-12-06 NOTE — Telephone Encounter (Signed)
Spoke with patient and he stated that he is in the donut hole. He is aware that I can place two weeks of samples at the front desk and that we would not be able to give him enough to last until the first of the year. Patient verbalized his understanding and was appreciative.

## 2016-12-12 DIAGNOSIS — M48061 Spinal stenosis, lumbar region without neurogenic claudication: Secondary | ICD-10-CM | POA: Diagnosis not present

## 2016-12-12 DIAGNOSIS — I10 Essential (primary) hypertension: Secondary | ICD-10-CM | POA: Diagnosis not present

## 2016-12-12 DIAGNOSIS — Z6832 Body mass index (BMI) 32.0-32.9, adult: Secondary | ICD-10-CM | POA: Diagnosis not present

## 2017-02-15 ENCOUNTER — Other Ambulatory Visit: Payer: Self-pay | Admitting: Cardiovascular Disease

## 2017-02-25 ENCOUNTER — Other Ambulatory Visit: Payer: Self-pay | Admitting: Cardiovascular Disease

## 2017-03-25 ENCOUNTER — Other Ambulatory Visit: Payer: Self-pay | Admitting: Cardiovascular Disease

## 2017-03-26 ENCOUNTER — Ambulatory Visit: Payer: PPO | Admitting: Physician Assistant

## 2017-04-25 ENCOUNTER — Ambulatory Visit: Payer: PPO | Admitting: Cardiovascular Disease

## 2017-05-02 ENCOUNTER — Other Ambulatory Visit: Payer: Self-pay | Admitting: Cardiovascular Disease

## 2017-05-02 DIAGNOSIS — I10 Essential (primary) hypertension: Secondary | ICD-10-CM

## 2017-05-02 DIAGNOSIS — I2510 Atherosclerotic heart disease of native coronary artery without angina pectoris: Secondary | ICD-10-CM

## 2017-05-02 NOTE — Telephone Encounter (Signed)
Age 72 years WT 102.6 kg 09/27/2016 Saw Dr Acie Fredrickson 09/27/2016 SrCr 1.46 done by Dr Kelton Pillar on 11/19/2016 Refill done for Eliquis 5mg  q 12 hours as requested

## 2017-05-16 ENCOUNTER — Other Ambulatory Visit: Payer: Self-pay | Admitting: Cardiovascular Disease

## 2017-06-22 ENCOUNTER — Other Ambulatory Visit: Payer: Self-pay | Admitting: Cardiovascular Disease

## 2017-07-19 ENCOUNTER — Ambulatory Visit: Payer: PPO | Admitting: Cardiovascular Disease

## 2017-07-19 ENCOUNTER — Other Ambulatory Visit: Payer: Medicare Other | Admitting: *Deleted

## 2017-07-19 ENCOUNTER — Encounter: Payer: Self-pay | Admitting: Cardiovascular Disease

## 2017-07-19 VITALS — BP 130/56 | HR 57 | Ht 70.0 in | Wt 224.8 lb

## 2017-07-19 DIAGNOSIS — M545 Low back pain, unspecified: Secondary | ICD-10-CM

## 2017-07-19 DIAGNOSIS — I480 Paroxysmal atrial fibrillation: Secondary | ICD-10-CM

## 2017-07-19 DIAGNOSIS — R600 Localized edema: Secondary | ICD-10-CM

## 2017-07-19 DIAGNOSIS — I2510 Atherosclerotic heart disease of native coronary artery without angina pectoris: Secondary | ICD-10-CM

## 2017-07-19 DIAGNOSIS — E782 Mixed hyperlipidemia: Secondary | ICD-10-CM

## 2017-07-19 LAB — BASIC METABOLIC PANEL
BUN/Creatinine Ratio: 11 (ref 10–24)
BUN: 15 mg/dL (ref 8–27)
CALCIUM: 8.9 mg/dL (ref 8.6–10.2)
CO2: 21 mmol/L (ref 20–29)
CREATININE: 1.31 mg/dL — AB (ref 0.76–1.27)
Chloride: 105 mmol/L (ref 96–106)
GFR calc Af Amer: 63 mL/min/{1.73_m2} (ref >59)
GFR, EST NON AFRICAN AMERICAN: 55 mL/min/{1.73_m2} — AB (ref >59)
Glucose: 95 mg/dL (ref 65–99)
Potassium: 3.8 mmol/L (ref 3.5–5.2)
Sodium: 141 mmol/L (ref 134–144)

## 2017-07-19 LAB — HEPATIC FUNCTION PANEL
ALT: 49 IU/L — AB (ref 0–44)
AST: 29 IU/L (ref 0–40)
Albumin: 4 g/dL (ref 3.5–4.8)
Alkaline Phosphatase: 77 IU/L (ref 39–117)
BILIRUBIN TOTAL: 0.4 mg/dL (ref 0.0–1.2)
BILIRUBIN, DIRECT: 0.15 mg/dL (ref 0.00–0.40)
Total Protein: 5.8 g/dL — ABNORMAL LOW (ref 6.0–8.5)

## 2017-07-19 LAB — LIPID PANEL
CHOL/HDL RATIO: 3 ratio (ref 0.0–5.0)
Cholesterol, Total: 113 mg/dL (ref 100–199)
HDL: 38 mg/dL — ABNORMAL LOW (ref >39)
LDL Calculated: 61 mg/dL (ref 0–99)
Triglycerides: 68 mg/dL (ref 0–149)
VLDL Cholesterol Cal: 14 mg/dL (ref 5–40)

## 2017-07-19 MED ORDER — METHOCARBAMOL 500 MG PO TABS: 1000.0000 mg | ORAL_TABLET | Freq: Four times a day (QID) | ORAL | 1 refills | Status: DC | PRN

## 2017-07-19 NOTE — Progress Notes (Signed)
[name] Date of Birth  Jun 17, 1945       [name] [address], [address], [place], [place]  [address]   [place], [place]  [address] [place]   Fax  [phone]     Fax [phone]  Problem List: 1. Hypertension 2. Hyperlipidemia 3. Hypothyroidism 4. CAD -   Moderate CAD by cath 11/09/2013 5. Paroxsymal atrial fib:     [name] is a 20 old gentleman with a history of hypertension, hyperlipidemia and a positive family history of cardiac disease. He was referred for possible stress testing given his risk factors.  He has not had any symptoms of CP or dyspnea.  He worked for the [employer] for 33 years - retired 2005.  Currently, he spends his time riding his [employer] Eon , plays golf, hunts deer, [place], squirrel, .  Walks about a mile each day.  Does all this without any CP or dyspnea.  Lifts light weights and uses resistance bands.    Grandfather died at age 6, father died at age 64. Uncle died in his 92's   11/13/2013:  Kerrion presents today for followup. We performed a coronary calcium score which revealed significant calcification: Dense calcification distal LM Scattered calcification of mid and distal LAD and mid and distal RCA  Subsequent stress myoview was abnormal Overall Impression: Intermediate risk stress nuclear study with mild lateral wall ischemia and mildly depressed LV systolic function.  LV Ejection Fraction: 47%. LV Wall Motion: NL LV Function; NL Wall Motion  Jan. 13, 2016: [name] is a 72 yo with an abnormal coronary calcium score - significant calcification: Dense calcification distal LM Scattered calcification of mid and distal LAD and mid and distal RCA Myoview was abnormal and he was referred for cath.   LM  moderate calcification. The vessel has 20-30% stenosis in its midportion. The ostium of the left main is widely patent. The distal left main is  widely patent.    (LAD): the LAD is medium in caliber. The vessel reaches the LV apex. The proximal vessel has diffuse irregularity with mild calcification.  D1 has 30-40% stenosis at its ostium. The mid LAD at that bifurcation also has 30-40% stenosis. The remaining portions of the LAD have mild nonobstructive disease. Left circumflex (LCx): the left circumflex is patent. The mid vessel has 40% stenosis. There is a tiny obtuse marginal branch that has 50% stenosis. The major OM branch is widely patent with minor nonobstructive disease. Right coronary artery (RCA): this is a dominant vessel. There is diffuse calcification and irregularity but there are no high-grade stenoses present. There is no more than about 20 or 30% stenosis which is most significant at the junction of the mid and distal RCA. The PDA and PLA branches are patent. Left ventriculography: Left ventricular systolic function is normal, LVEF is estimated at 55%  Feeling well.  No CP . The cath showed mild - moderate irregularities. He has not had any further issues  July 19, 2014:   [name] is doing well.  No dizziness, no syncope  Has some ringing in his ears.  Takes his BP at home, readings are in the normal range.   Jan. 23, 2017: [name] is seen back for a follow up visit. Walks on occasion .  Not as much exercise as in the summer . No angina . No dyspnea.  BP  is well controlled.   Sept. 6, 2017:  Joseandres is seen back for follow up of newly diagnosed atrial fib.  The afib was diagnosed when he came for his echo - was seen incidentally on the echo. Has been started on Eliquis  No bleeding  Still exercising . Has been watching his salt .   March 01, 2016:    Seen back for his mild CAD and Paroxysmal atrial fib  Cannot tell when he is in Atrial fib. Has some indigestion.  Belches and feels better  Not exercising much.  Weather has been challenging  No CP or dyspnea doing his regular activities.   Sept. 27, 2018:     Brewster is seen back for follow-up of his mild to moderate coronary artery disease and his paroxysmal atrial fibrillation. Has had some ankle swelling.   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  Is also on amlodipine ( which is contributing to the edema )   July 19, 2017: Jerami is seen today for follow-up of his mild coronary artery disease, paroxysmal atrial for ablation.  He pulled a muscle in his back and is requesting some muscle relaxers.  Current Outpatient Medications  Medication Sig Dispense Refill  . amLODipine (NORVASC) 5 MG tablet Take 1 tablet (5 mg total) by mouth daily. 30 tablet 4  . atorvastatin (LIPITOR) 40 MG tablet TAKE 1 TABLET BY MOUTH DAILY. 90 tablet 2  . carvedilol (COREG) 6.25 MG tablet Take 1 tablet by mouth 2 (two) times daily.    Marland Kitchen co-enzyme Q-10 50 MG capsule Take 100 mg by mouth daily. 2 TABLETS    . doxazosin (CARDURA) 4 MG tablet Take 4 mg by mouth daily. 1 TABLETS    . ELIQUIS 5 MG TABS tablet TAKE 1 TABLET BY MOUTH TWICE DAILY. 60 tablet 5  . fexofenadine (ALLEGRA) 180 MG tablet Take 180 mg by mouth daily.    . Fish Oil-Cholecalciferol (FISH OIL + D3) 1000-1000 MG-UNIT CAPS Take 1,200 mg by mouth daily.    . Garlic (GARLIQUE PO) Take by mouth daily.    . multivitamin-iron-minerals-folic acid (CENTRUM) chewable tablet Chew 1 tablet by mouth daily.    Marland Kitchen olmesartan-hydrochlorothiazide (BENICAR HCT) 40-12.5 MG tablet     . SHINGRIX injection As directed    . SYNTHROID 100 MCG tablet Take 100 mcg by mouth daily before breakfast.     . traZODone (DESYREL) 50 MG tablet Take 50 mg by mouth at bedtime.     Marland Kitchen zolpidem (AMBIEN) 10 MG tablet Take 10 mg by mouth at bedtime.      No current facility-administered medications for this visit.      No Known Allergies  Past Medical History:  Diagnosis Date  . BPH (benign prostatic hyperplasia)   . CAD in native artery   . ED (erectile dysfunction)   . Hyperlipidemia   . Hypertension   .  Osteoarthritis   . Thyroid disease     Past Surgical History:  Procedure Laterality Date  . CARDIAC CATHETERIZATION  11/09/13   non obstructive disease in all vessels.  Marland Kitchen LEFT HEART CATHETERIZATION WITH CORONARY ANGIOGRAM N/A 11/09/2013   Procedure: LEFT HEART CATHETERIZATION WITH CORONARY ANGIOGRAM;  Surgeon: Blane Ohara, MD;  Location: [facility] CATH LAB;  Service: Cardiovascular;  Laterality: N/A;  . LOWER BACK SURGERY  1985  . NECK SURGERY  1999  . REPLACEMENT TOTAL KNEE  2005   LEFT KNEE    Social  History   Tobacco Use  Smoking Status Former Smoker  . Last attempt to quit: 05/06/1999  . Years since quitting: 18.2  Smokeless Tobacco Former Systems developer  . Quit date: 07/28/1995    Social History   Substance and Sexual Activity  Alcohol Use Yes  . Alcohol/week: 0.6 oz  . Types: 1 Cans of beer per week    Family History  Problem Relation Age of Onset  . Asthma Mother   . Parkinsonism Mother   . Heart disease Father   . Heart attack Father   . Prostate cancer Father   . Hyperlipidemia Brother     Reviw of Systems:  Reviewed in the HPI.  All other systems are negative.   ECG:     July 19, 2017:   Sinus brady at 8.  Occasional PVCs   Assessment / Plan:   1. Hypertension - . BP is well controlled.   2.  Back / flank pain :   ? Pulled muscle.  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  Will give Robaxin 1000 mg QID  2. Hyperlipidemia -    4. CAD -   No angina    6. Paroxysmal atrial fibrillation:     6.  Hyperlipidemia:   Labs look great from this am .    Mertie Moores, MD  07/19/2017 4:42 PM    [facility] [address],  [place], [place]  [address] Pager [phone] Phone: [phone]; Fax: [phone]

## 2017-07-19 NOTE — Patient Instructions (Addendum)
Medication Instructions:  Your physician has recommended you make the following change in your medication:   START Robaxin (Methocarbamol) 1000 mg up to 4 times per day as needed for back pain   Labwork: None Ordered   Testing/Procedures: None Ordered   Follow-Up: Your physician wants you to follow-up in: 1 year with Dr. Acie Fredrickson. You will receive a reminder letter in the mail two months in advance. If you don't receive a letter, please call our office to schedule the follow-up appointment.   If you need a refill on your cardiac medications before your next appointment, please call your pharmacy.   Thank you for choosing [facility]! Christen Bame, RN [phone]

## 2017-08-22 ENCOUNTER — Other Ambulatory Visit: Payer: Self-pay | Admitting: Cardiovascular Disease

## 2017-09-03 ENCOUNTER — Other Ambulatory Visit: Payer: Self-pay | Admitting: Cardiovascular Disease

## 2017-09-17 ENCOUNTER — Telehealth: Payer: Self-pay

## 2017-09-17 ENCOUNTER — Other Ambulatory Visit: Payer: Self-pay | Admitting: Neurosurgery

## 2017-09-17 DIAGNOSIS — G8929 Other chronic pain: Secondary | ICD-10-CM

## 2017-09-17 DIAGNOSIS — M5441 Lumbago with sciatica, right side: Principal | ICD-10-CM

## 2017-09-17 DIAGNOSIS — M5442 Lumbago with sciatica, left side: Principal | ICD-10-CM

## 2017-09-17 NOTE — Telephone Encounter (Signed)
   [facility] Pre-operative Risk Assessment    Request for surgical clearance:  1. What type of surgery is being performed?  Myelogram   2. When is this surgery scheduled?  TBD   3. What type of clearance is required (medical clearance vs. Pharmacy clearance to hold med vs. Both)?  Both  4. Are there any medications that need to be held prior to surgery and how long? Eliquis   5. Practice name and name of physician performing surgery? [facility]   6. What is your office phone number [phone]    7.   What is your office fax number [phone]  8.   Anesthesia type (None, local, MAC, general) ?     Legrand Como  Sherida Dobkins 09/17/2017, 12:06 PM  _________________________________________________________________   (provider comments below)

## 2017-09-17 NOTE — Telephone Encounter (Signed)
   Primary Cardiologist: Mertie Moores, MD  Chart reviewed as part of pre-operative protocol coverage. Given past medical history and time since last visit, based on ACC/AHA guidelines, Logan Sanders would be at acceptable risk for the planned procedure without further cardiovascular testing.   I will route to pharmacy to address anticoagulation.   I will route this recommendation to the requesting party via Epic fax function and remove from pre-op pool.  Please call with questions.  Lyda Jester, PA-C 09/17/2017, 3:42 PM

## 2017-09-17 NOTE — Telephone Encounter (Signed)
Pt takes Eliquis for afib with CHADS2VASc score of 4 (age, HTN, CHF, CAD). CrCl is 55mL/min. Ok to hold Eliquis for 3 days prior to spinal procedure per protocol.

## 2017-09-18 NOTE — Telephone Encounter (Signed)
   Primary Cardiologist: Mertie Moores, MD  Chart reviewed as part of pre-operative protocol coverage. Given past medical history and time since last visit, based on ACC/AHA guidelines, Logan Sanders would be at acceptable risk for the planned procedure without further cardiovascular testing.   Per pharmacy, [place] to hold Eliquis for 3 days prior to spinal procedure per protocol.  I will route this recommendation to the requesting party via Epic fax function and remove from pre-op pool.  Please call with questions.  Lyda Jester, PA-C 09/18/2017, 1:44 PM

## 2017-09-30 ENCOUNTER — Ambulatory Visit
Admission: RE | Admit: 2017-09-30 | Discharge: 2017-09-30 | Disposition: A | Discharge status: Home / Self Care | Payer: Medicare Other | Source: Ambulatory Visit | Attending: Neurosurgery | Admitting: Neurosurgery

## 2017-09-30 DIAGNOSIS — M5441 Lumbago with sciatica, right side: Principal | ICD-10-CM

## 2017-09-30 DIAGNOSIS — G8929 Other chronic pain: Secondary | ICD-10-CM

## 2017-09-30 DIAGNOSIS — M5442 Lumbago with sciatica, left side: Principal | ICD-10-CM

## 2017-09-30 MED ORDER — DIAZEPAM 5 MG PO TABS
5.0000 mg | ORAL_TABLET | Freq: Once | ORAL | Status: AC
  Administered 2017-09-30: 5 mg via ORAL

## 2017-09-30 MED ORDER — IOPAMIDOL (ISOVUE-M 200) INJECTION 41%
15.0000 mL | Freq: Once | INTRAMUSCULAR | Status: AC
  Administered 2017-09-30: 15 mL via INTRATHECAL

## 2017-09-30 MED ORDER — ONDANSETRON HCL 4 MG/2ML IJ SOLN: 4.0000 mg | Freq: Four times a day (QID) | INTRAMUSCULAR | Status: DC | PRN

## 2017-09-30 NOTE — Discharge Instructions (Signed)
Myelogram Discharge Instructions  1. Go home and rest quietly for the next 24 hours.  It is important to lie flat for the next 24 hours.  Get up only to go to the restroom.  You may lie in the bed or on a couch on your back, your stomach, your left side or your right side.  You may have one pillow under your head.  You may have pillows between your knees while you are on your side or under your knees while you are on your back.  2. DO NOT drive today.  Recline the seat as far back as it will go, while still wearing your seat belt, on the way home.  3. You may get up to go to the bathroom as needed.  You may sit up for 10 minutes to eat.  You may resume your normal diet and medications unless otherwise indicated.  Drink lots of extra fluids today and tomorrow.  4. The incidence of headache, nausea, or vomiting is about 5% (one in 20 patients).  If you develop a headache, lie flat and drink plenty of fluids until the headache goes away.  Caffeinated beverages may be helpful.  If you develop severe nausea and vomiting or a headache that does not go away with flat bed rest, call [phone].  5. You may resume normal activities after your 24 hours of bed rest is over; however, do not exert yourself strongly or do any heavy lifting tomorrow. If when you get up you have a headache when standing, go back to bed and force fluids for another 24 hours.  6. Call your physician for a follow-up appointment.  The results of your myelogram will be sent directly to your physician by the following day.  7. If you have any questions or if complications develop after you arrive home, please call [phone].  Discharge instructions have been explained to the patient.  The patient, or the person responsible for the patient, fully understands these instructions.   YOU MAY RESTART YOUR ELIQUIS TODAY. YOU MAY RESTART YOUR TRAZODONE TOMORROW 10/01/2017 AT 10:30AM.

## 2017-10-17 ENCOUNTER — Telehealth: Payer: Self-pay

## 2017-10-17 NOTE — Telephone Encounter (Signed)
   [facility] Pre-operative Risk Assessment    Request for surgical clearance:  1. What type of surgery is being performed? Spinal Injection   2. When is this surgery scheduled? TBD   3. What type of clearance is required (medical clearance vs. Pharmacy clearance to hold med vs. Both)? Both  4. Are there any medications that need to be held prior to surgery and how long? Eliquis for 3-5 days   5. Practice name and name of physician performing surgery? [facility]  6. What is your office phone number [phone]   7.   What is your office fax number [phone]  8.   Anesthesia type (None, local, MAC, general) ?

## 2017-10-18 NOTE — Telephone Encounter (Signed)
Pt takes Eliquis for afib with CHADS2VASc score of 4 (age, HTN, CHF, CAD). Renal function is normal. Ok to hold Eliquis for 3 days prior to procedure.

## 2017-10-22 ENCOUNTER — Other Ambulatory Visit: Payer: Self-pay | Admitting: Neurosurgery

## 2017-10-24 ENCOUNTER — Telehealth: Payer: Self-pay | Admitting: Cardiovascular Disease

## 2017-10-24 NOTE — Telephone Encounter (Signed)
Ok to hold Eliquis 3 days prior to procedure.  Kerin Ransom PA-C 10/24/2017 3:28 PM

## 2017-10-24 NOTE — Telephone Encounter (Signed)
Follow up   Molly from [facility] is calling to check on the surgical clearance status that was faxed 10/17-notes in epic

## 2017-10-29 ENCOUNTER — Telehealth: Payer: Self-pay | Admitting: Cardiovascular Disease

## 2017-10-29 NOTE — Telephone Encounter (Signed)
Please sent this prior clearance letter manually and close this encounter.   "Primary Cardiologist: Mertie Moores, MD  Chart reviewed as part of pre-operative protocol coverage. Given past medical history and time since last visit, based on ACC/AHA guidelines, MARICO BUCKLE would be at acceptable risk for the planned procedure without further cardiovascular testing.   Per pharmacy, [place] to hold Eliquis for 3 days prior to spinal procedure per protocol.  I will route this recommendation to the requesting party via Epic fax function and remove from pre-op pool.  Please call with questions.  Lyda Jester, PA-C 09/18/2017, 1:44 PM".

## 2017-10-29 NOTE — Telephone Encounter (Signed)
Called [facility] @ [facility] re: Pt's surgery clearance.  Confirmed that the clearance was sent to the incorrect fax #.  Resent it to the corrected #, [phone] and verified that it has been received.

## 2017-10-29 NOTE — Telephone Encounter (Signed)
New message   Patient states that [facility] has not received the fax for the surgical clearance for the spinal injection.Please fax to [facility]. The fax is [phone]

## 2017-11-21 ENCOUNTER — Telehealth: Payer: Self-pay | Admitting: Cardiovascular Disease

## 2017-11-21 NOTE — Telephone Encounter (Signed)
Pt c/o BP issue: STAT if pt c/o blurred vision, one-sided weakness or slurred speech  1. What are your last 5 BP readings?  156/54  2. Are you having any other symptoms (ex. Dizziness, headache, blurred vision, passed out)? no  3. What is your BP issue? Patient is wondering if this is something he should be concerned about.  He states his BP is normally around 135/70

## 2017-11-21 NOTE — Telephone Encounter (Signed)
Called patient in regards to elevated BP reading which he states was taken when he was at [place] today. He states BP has been normal prior to today. He states he was at the appointment for management of hip pain. I advised him that pain will increase BP and to continue to monitor. I advised that if he consistently gets readings >140/80 to call back for advice. He verbalized understanding and agreement and thanked me for the call.

## 2017-11-27 ENCOUNTER — Other Ambulatory Visit: Payer: Self-pay | Admitting: Cardiovascular Disease

## 2017-11-27 ENCOUNTER — Telehealth: Payer: Self-pay | Admitting: Cardiovascular Disease

## 2017-11-27 DIAGNOSIS — I2510 Atherosclerotic heart disease of native coronary artery without angina pectoris: Secondary | ICD-10-CM

## 2017-11-27 DIAGNOSIS — I10 Essential (primary) hypertension: Secondary | ICD-10-CM

## 2017-11-27 NOTE — Telephone Encounter (Signed)
° ° °  Patient wants to discuss heart flutter, and HR.  Patient states the last several days he has not felt well. Does not have any readings. Requesting to speak with nurse

## 2017-11-27 NOTE — Telephone Encounter (Signed)
Called patient back about his message. Patient complaining of having palpitations/ heart flutter for 3 or 4 days. Patient would like to see someone in the next week to be checked out. HR has been running 60 to 80, at one time patient stated HR dropped to 48. Patient stated his BP has been running high as well, BP 156/65. Patient stated he is taking his eliquis, benicar/HCT, Coreg, and norvasc as directed. Made patient an appointment on Wednesday, next available with Dr. Acie Fredrickson. Informed patient that a message would be sent to Dr. Acie Fredrickson for further advisement.

## 2017-11-27 NOTE — Telephone Encounter (Signed)
Eliquis 5mg  refill request received; pt is 72 yrs old, wt-102kg, Crea-1.31 on 07/19/17, last seen by Dr. Acie Fredrickson on 07/19/17; will send in refill to requested pharmacy.

## 2017-12-02 NOTE — Telephone Encounter (Signed)
Called patient and offered him an appointment for today. Patient states he has to leave town at a certain time and the appointment time would not be conducive to leaving town. He states he is feeling well enough today to wait for appointment with Dr. Acie Fredrickson on Wednesday. He thanked me for the call.

## 2017-12-04 ENCOUNTER — Ambulatory Visit: Payer: Medicare Other | Admitting: Cardiovascular Disease

## 2017-12-04 ENCOUNTER — Encounter: Payer: Self-pay | Admitting: Cardiovascular Disease

## 2017-12-04 ENCOUNTER — Other Ambulatory Visit: Payer: Self-pay | Admitting: Neurosurgery

## 2017-12-04 VITALS — BP 124/42 | HR 72 | Ht 70.0 in | Wt 226.0 lb

## 2017-12-04 DIAGNOSIS — I480 Paroxysmal atrial fibrillation: Secondary | ICD-10-CM

## 2017-12-04 DIAGNOSIS — I493 Ventricular premature depolarization: Secondary | ICD-10-CM

## 2017-12-04 DIAGNOSIS — I2510 Atherosclerotic heart disease of native coronary artery without angina pectoris: Secondary | ICD-10-CM | POA: Diagnosis not present

## 2017-12-04 NOTE — Patient Instructions (Signed)
Medication Instructions:  Your physician recommends that you continue on your current medications as directed. Please refer to the Current Medication list given to you today.  If you need a refill on your cardiac medications before your next appointment, please call your pharmacy.   Lab work: TODAY - basic metabolic panel  If you have labs (blood work) drawn today and your tests are completely normal, you will receive your results only by: Marland Kitchen MyChart Message (if you have MyChart) OR . A paper copy in the mail If you have any lab test that is abnormal or we need to change your treatment, we will call you to review the results.   Testing/Procedures: Your physician has requested that you have an echocardiogram. Echocardiography is a painless test that uses sound waves to create images of your heart. It provides your doctor with information about the size and shape of your heart and how well your heart's chambers and valves are working. This procedure takes approximately one hour. There are no restrictions for this procedure.    Follow-Up: At [facility], you and your health needs are our priority.  As part of our continuing mission to provide you with exceptional heart care, we have created designated Provider Care Teams.  These Care Teams include your primary Cardiologist (physician) and Advanced Practice Providers (APPs -  Physician Assistants and Nurse Practitioners) who all work together to provide you with the care you need, when you need it. You will need a follow up appointment in:  1 years.  Please call our office 2 months in advance to schedule this appointment.  You may see Mertie Moores, MD or one of the following Advanced Practice Providers on your designated Care Team: Richardson Dopp, PA-C [place], [place] . Daune Perch, NP

## 2017-12-04 NOTE — Progress Notes (Signed)
Logan Sanders Date of Birth  Jun 17, 1945       [name] [address], [address], [place], [place]  [address]   [place], [place]  [address] [place]   Fax  [phone]     Fax [phone]  Problem List: 1. Hypertension 2. Hyperlipidemia 3. Hypothyroidism 4. CAD -   Moderate CAD by cath 11/09/2013 5. Paroxsymal atrial fib:     Logan Sanders is a 20 old gentleman with a history of hypertension, hyperlipidemia and a positive family history of cardiac disease. He was referred for possible stress testing given his risk factors.  He has not had any symptoms of CP or dyspnea.  He worked for the [employer] for 33 years - retired 2005.  Currently, he spends his time riding his [place] , plays golf, hunts deer, [place], squirrel, .  Walks about a mile each day.  Does all this without any CP or dyspnea.  Lifts light weights and uses resistance bands.    Grandfather died at age 6, father died at age 64. Uncle died in his 92's   11/13/2013:  Logan Sanders presents today for followup. We performed a coronary calcium score which revealed significant calcification: Dense calcification distal LM Scattered calcification of mid and distal LAD and mid and distal RCA  Subsequent stress myoview was abnormal Overall Impression: Intermediate risk stress nuclear study with mild lateral wall ischemia and mildly depressed LV systolic function.  LV Ejection Fraction: 47%. LV Wall Motion: NL LV Function; NL Wall Motion  Jan. 13, 2016: Logan Sanders is a 72 yo with an abnormal coronary calcium score - significant calcification: Dense calcification distal LM Scattered calcification of mid and distal LAD and mid and distal RCA Myoview was abnormal and he was referred for cath.   LM  moderate calcification. The vessel has 20-30% stenosis in its midportion. The ostium of the left main is widely patent. The distal left main is  widely patent.    (LAD): the LAD is medium in caliber. The vessel reaches the LV apex. The proximal vessel has diffuse irregularity with mild calcification.  D1 has 30-40% stenosis at its ostium. The mid LAD at that bifurcation also has 30-40% stenosis. The remaining portions of the LAD have mild nonobstructive disease. Left circumflex (LCx): the left circumflex is patent. The mid vessel has 40% stenosis. There is a tiny obtuse marginal branch that has 50% stenosis. The major OM branch is widely patent with minor nonobstructive disease. Right coronary artery (RCA): this is a dominant vessel. There is diffuse calcification and irregularity but there are no high-grade stenoses present. There is no more than about 20 or 30% stenosis which is most significant at the junction of the mid and distal RCA. The PDA and PLA branches are patent. Left ventriculography: Left ventricular systolic function is normal, LVEF is estimated at 55%  Feeling well.  No CP . The cath showed mild - moderate irregularities. He has not had any further issues  July 19, 2014:   Logan Sanders is doing well.  No dizziness, no syncope  Has some ringing in his ears.  Takes his BP at home, readings are in the normal range.   Jan. 23, 2017: Logan Sanders is seen back for a follow up visit. Walks on occasion .  Not as much exercise as in the summer . No angina . No dyspnea.  BP  is well controlled.   Sept. 6, 2017:  Logan Sanders is seen back for follow up of newly diagnosed atrial fib.  The afib was diagnosed when he came for his echo - was seen incidentally on the echo. Has been started on Eliquis  No bleeding  Still exercising . Has been watching his salt .   March 01, 2016:    Seen back for his mild CAD and Paroxysmal atrial fib  Cannot tell when he is in Atrial fib. Has some indigestion.  Belches and feels better  Not exercising much.  Weather has been challenging  No CP or dyspnea doing his regular activities.   Sept. 27, 2018:     Logan Sanders is seen back for follow-up of his mild to moderate coronary artery disease and his paroxysmal atrial fibrillation. Has had some ankle swelling.   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  Is also on amlodipine ( which is contributing to the edema )   July 19, 2017: Logan Sanders is seen today for follow-up of his mild coronary artery disease, paroxysmal atrial fib .  He pulled a muscle in his back and is requesting some muscle relaxers.  December 04, 2017:  Logan Sanders is seen today for follow-up of his moderate coronary artery disease and paroxysmal atrial fibrillation.  He was recently seen at Dr. Dellis Filbert office.  He was found to have a blood pressure with a a moderately high systolic reading in a very low diastolic reading. Is having frequent PVCs  SPECT that his premature ventricular contractions contributed to his very low diastolic blood pressure reading.  He is fairly active.  He is limited by his hip pain.  Is scheduled to have back surgery on Dec. 18, 2019 hes not having any CP . He is at low risk for this surgery .   Also has had some hemorrhoid issues Needs to have them banded and needs to hold Eliquis for 3 days with that. I've given him the OK for that procedure as well.       Current Outpatient Medications  Medication Sig Dispense Refill  . amLODipine (NORVASC) 5 MG tablet TAKE 1 TABLET ONCE DAILY. 30 tablet 10  . atorvastatin (LIPITOR) 40 MG tablet TAKE 1 TABLET BY MOUTH DAILY. 90 tablet 2  . carvedilol (COREG) 6.25 MG tablet Take 1 tablet (6.25 mg total) by mouth 2 (two) times daily. 180 tablet 3  . Co-Enzyme Q-10 100 MG CAPS Take 100 mg by mouth daily.     Marland Kitchen doxazosin (CARDURA) 8 MG tablet Take 8 mg by mouth at bedtime.     Marland Kitchen ELIQUIS 5 MG TABS tablet TAKE 1 TABLET BY MOUTH TWICE DAILY. 60 tablet 5  . fexofenadine (ALLEGRA) 180 MG tablet Take 180 mg by mouth daily as needed for allergies.     . multivitamin-iron-minerals-folic acid (CENTRUM)  chewable tablet Chew 1 tablet by mouth daily.    Marland Kitchen olmesartan-hydrochlorothiazide (BENICAR HCT) 40-12.5 MG tablet Take 1 tablet by mouth daily.     . Omega-3 Fatty Acids (FISH OIL PO) Take 1 capsule by mouth daily.    Marland Kitchen omeprazole (PRILOSEC) 40 MG capsule Take 40 mg by mouth daily as needed (for acid reflux).    . SYNTHROID 100 MCG tablet Take 100 mcg by mouth daily before breakfast.     . traZODone (DESYREL) 50 MG tablet Take 50 mg by mouth at bedtime as needed for sleep.     Marland Kitchen zolpidem (AMBIEN) 10  MG tablet Take 10 mg by mouth at bedtime as needed for sleep.      No current facility-administered medications for this visit.      No Known Allergies  Past Medical History:  Diagnosis Date  . BPH (benign prostatic hyperplasia)   . CAD in native artery   . ED (erectile dysfunction)   . Hyperlipidemia   . Hypertension   . Osteoarthritis   . Thyroid disease     Past Surgical History:  Procedure Laterality Date  . CARDIAC CATHETERIZATION  11/09/13   non obstructive disease in all vessels.  Marland Kitchen LEFT HEART CATHETERIZATION WITH CORONARY ANGIOGRAM N/A 11/09/2013   Procedure: LEFT HEART CATHETERIZATION WITH CORONARY ANGIOGRAM;  Surgeon: Blane Ohara, MD;  Location: [facility] C[facility] LAB;  Service: Cardiovascular;  Laterality: N/A;  . LOWER BACK SURGERY  1985  . NECK SURGERY  1999  . REPLACEMENT TOTAL KNEE  2005   LEFT KNEE    Social History   Tobacco Use  Smoking Status Former Smoker  . Last attempt to quit: 05/06/1999  . Years since quitting: 18.5  Smokeless Tobacco Former Systems developer  . Quit date: 07/28/1995    Social History   Substance and Sexual Activity  Alcohol Use Yes  . Alcohol/week: 1.0 standard drinks  . Types: 1 Cans of beer per week    Family History  Problem Relation Age of Onset  . Asthma Mother   . Parkinsonism Mother   . Heart disease Father   . Heart attack Father   . Prostate cancer Father   . Hyperlipidemia Brother     Reviw of Systems:  Reviewed in the HPI.   All other systems are negative.   ECG:     December 04, 2016: Normal sinus rhythm at 72 beats a minute.  Frequent premature ventricular contractions in a bigeminal or trigeminal pattern.  Assessment / Plan:   1. Hypertension - . BP is well controlled.  He was found to have some low diastolic blood pressure readings.  I suspect this is an artifact because of his frequent premature ventricular contractions.  2.  Frequent premature ventricular contractions: He is having lots of PVCs.  We will check a basic metabolic profile today.  He has his thyroid checked by his primary medical doctor. We will get an echocardiogram to make sure that his LV function is normal.   3. CAD -   No angina    4.   Paroxysmal atrial fibrillation: He remains in sinus rhythm. Is currently on Eliquis.  He will need to have his Eliquis held for several procedures.  It would be okay for him to hold his Eliquis for 2 to 3 days for each of these procedures will be at low risk.  5.  Preoperative evaluation: The patient needs to have some back surgery and all night needs to have banding of some hemorrhoids.  He is at low risk for each of these procedures.  It will be okay for him to hold his Eliquis for 2 to 3 days prior to these procedures and for several days after the procedure as needed.  He remains in sinus rhythm and his risk of stroke is very low.     6.  Hyperlipidemia:       Mertie Moores, MD  12/04/2017 2:17 PM    [facility] [place],  [place] [place], [place]  [address] Pager [phone] Phone: [phone]; Fax: [phone]

## 2017-12-05 LAB — BASIC METABOLIC PANEL
BUN / CREAT RATIO: 13 (ref 10–24)
BUN: 20 mg/dL (ref 8–27)
CALCIUM: 9.2 mg/dL (ref 8.6–10.2)
CO2: 24 mmol/L (ref 20–29)
CREATININE: 1.49 mg/dL — AB (ref 0.76–1.27)
Chloride: 103 mmol/L (ref 96–106)
GFR, EST AFRICAN AMERICAN: 54 mL/min/{1.73_m2} — AB (ref >59)
GFR, EST NON AFRICAN AMERICAN: 47 mL/min/{1.73_m2} — AB (ref >59)
Glucose: 102 mg/dL — ABNORMAL HIGH (ref 65–99)
Potassium: 3.7 mmol/L (ref 3.5–5.2)
Sodium: 141 mmol/L (ref 134–144)

## 2017-12-06 NOTE — Pre-Procedure Instructions (Signed)
Logan Sanders  12/06/2017      [place], [place] [address] Phone: [phone] Fax: [phone]    Your procedure is scheduled on Wednesday December 18th.  Report to [facility] Admitting at 0830 A.M.  Call this number if you have problems the morning of surgery:  [phone]   Remember:  Do not eat or drink after midnight.    Take these medicines the morning of surgery with A SIP OF WATER  amLODipine (NORVASC) atorvastatin (LIPITOR)  carvedilol (COREG)  fexofenadine (ALLEGRA) if needed omeprazole (PRILOSEC)  SYNTHROID   Follow your surgeon's instructions for when to stop/resume your Eliquis. Hold for 3 days prior to surgery per Dr. Cathie Olden.  7 days prior to surgery STOP taking any Aspirin(unless otherwise instructed by your surgeon), Aleve, Naproxen, Ibuprofen, Motrin, Advil, Goody's, BC's, all herbal medications, fish oil, and all vitamins     Do not wear jewelry.  Do not wear lotions, powders, or colognes, or deodorant.  Men may shave face and neck.  Do not bring valuables to the hospital.  [facility] is not responsible for any belongings or valuables.  Contacts, dentures or bridgework may not be worn into surgery.  Leave your suitcase in the car.  After surgery it may be brought to your room.  For patients admitted to the hospital, discharge time will be determined by your treatment team.  Patients discharged the day of surgery will not be allowed to drive home.    [facility]- Preparing For Surgery  Before surgery, you can play an important role. Because skin is not sterile, your skin needs to be as free of germs as possible. You can reduce the number of germs on your skin by washing with CHG (chlorahexidine gluconate) Soap before surgery.  CHG is an antiseptic cleaner which kills germs and bonds with the skin to continue killing germs even after washing.     Oral Hygiene is also important to reduce your risk of infection.  Remember - BRUSH YOUR TEETH THE MORNING OF SURGERY WITH YOUR REGULAR TOOTHPASTE  Please do not use if you have an allergy to CHG or antibacterial soaps. If your skin becomes reddened/irritated stop using the CHG.  Do not shave (including legs and underarms) for at least 48 hours prior to first CHG shower. It is OK to shave your face.  Please follow these instructions carefully.   1. Shower the NIGHT BEFORE SURGERY and the MORNING OF SURGERY with CHG.   2. If you chose to wash your hair, wash your hair first as usual with your normal shampoo.  3. After you shampoo, rinse your hair and body thoroughly to remove the shampoo.  4. Use CHG as you would any other liquid soap. You can apply CHG directly to the skin and wash gently with a scrungie or a clean washcloth.   5. Apply the CHG Soap to your body ONLY FROM THE NECK DOWN.  Do not use on open wounds or open sores. Avoid contact with your eyes, ears, mouth and genitals (private parts). Wash Face and genitals (private parts)  with your normal soap.  6. Wash thoroughly, paying special attention to the area where your surgery will be performed.  7. Thoroughly rinse your body with warm water from the neck down.  8. DO NOT shower/wash with your normal soap after using and rinsing off the CHG Soap.  9. Logan Sanders  yourself dry with a CLEAN TOWEL.  10. Wear CLEAN PAJAMAS to bed the night before surgery, wear comfortable clothes the morning of surgery  11. Place CLEAN SHEETS on your bed the night of your first shower and DO NOT SLEEP WITH PETS.    Day of Surgery:  Do not apply any deodorants/lotions.  Please wear clean clothes to the hospital/surgery center.   Remember to brush your teeth WITH YOUR REGULAR TOOTHPASTE.    Please read over the following fact sheets that you were given.

## 2017-12-09 ENCOUNTER — Other Ambulatory Visit (HOSPITAL_COMMUNITY): Payer: Medicare Other

## 2017-12-09 ENCOUNTER — Encounter (HOSPITAL_COMMUNITY)
Admission: RE | Admit: 2017-12-09 | Discharge: 2017-12-09 | Disposition: A | Discharge status: Home / Self Care | Payer: Medicare Other | Source: Ambulatory Visit | Attending: Neurosurgery | Admitting: Neurosurgery

## 2017-12-09 ENCOUNTER — Other Ambulatory Visit: Payer: Self-pay

## 2017-12-09 ENCOUNTER — Encounter (HOSPITAL_COMMUNITY): Payer: Self-pay

## 2017-12-09 DIAGNOSIS — Z01818 Encounter for other preprocedural examination: Secondary | ICD-10-CM | POA: Insufficient documentation

## 2017-12-09 HISTORY — DX: Other complications of anesthesia, initial encounter: T88.59XA

## 2017-12-09 HISTORY — DX: Adverse effect of unspecified anesthetic, initial encounter: T41.45XA

## 2017-12-09 HISTORY — DX: Hypothyroidism, unspecified: E03.9

## 2017-12-09 LAB — BASIC METABOLIC PANEL
Anion gap: 11 (ref 5–15)
BUN: 23 mg/dL (ref 8–23)
CHLORIDE: 107 mmol/L (ref 98–111)
CO2: 19 mmol/L — AB (ref 22–32)
CREATININE: 1.47 mg/dL — AB (ref 0.61–1.24)
Calcium: 8.8 mg/dL — ABNORMAL LOW (ref 8.9–10.3)
GFR calc Af Amer: 55 mL/min — ABNORMAL LOW (ref >60)
GFR calc non Af Amer: 47 mL/min — ABNORMAL LOW (ref >60)
GLUCOSE: 100 mg/dL — AB (ref 70–99)
Potassium: 4.5 mmol/L (ref 3.5–5.1)
Sodium: 137 mmol/L (ref 135–145)

## 2017-12-09 LAB — CBC
HEMATOCRIT: 43.9 % (ref 39.0–52.0)
HEMOGLOBIN: 14.2 g/dL (ref 13.0–17.0)
MCH: 29.6 pg (ref 26.0–34.0)
MCHC: 32.3 g/dL (ref 30.0–36.0)
MCV: 91.6 fL (ref 80.0–100.0)
Platelets: 229 10*3/uL (ref 150–400)
RBC: 4.79 MIL/uL (ref 4.22–5.81)
RDW: 13.2 % (ref 11.5–15.5)
WBC: 9 10*3/uL (ref 4.0–10.5)
nRBC: 0 % (ref 0.0–0.2)

## 2017-12-09 LAB — SURGICAL PCR SCREEN
MRSA, PCR: NEGATIVE
Staphylococcus aureus: NEGATIVE

## 2017-12-09 NOTE — Progress Notes (Signed)
PCP - Kelton Pillar MD Cardiologist - Dr Cathie Olden  Chest x-ray - N/A EKG - 12/04/17 Stress Test - 2015 ECHO - 2017 Cardiac Cath - 2015  Blood Thinner Instructions: hold Eliquis for  3 days prior Aspirin Instructions: N/A  Anesthesia review: yes, hx cad, EKG review  Patient denies shortness of breath, fever, cough and chest pain at PAT appointment   Patient verbalized understanding of instructions that were given to them at the PAT appointment. Patient was also instructed that they will need to review over the PAT instructions again at home before surgery.

## 2017-12-10 ENCOUNTER — Encounter: Payer: Self-pay | Admitting: Cardiovascular Disease

## 2017-12-10 NOTE — Anesthesia Preprocedure Evaluation (Addendum)
Anesthesia Evaluation  Patient identified by MRN, date of birth, ID band Patient awake    Reviewed: Allergy & Precautions, NPO status , Patient's Chart, lab work & pertinent test results  Airway Mallampati: II  TM Distance: >3 FB Neck ROM: Full    Dental  (+) Teeth Intact, Dental Advisory Given   Pulmonary former smoker,    breath sounds clear to auscultation       Cardiovascular hypertension,  Rhythm:Regular Rate:Normal     Neuro/Psych    GI/Hepatic   Endo/Other    Renal/GU      Musculoskeletal   Abdominal   Peds  Hematology   Anesthesia Other Findings   Reproductive/Obstetrics                            Anesthesia Physical Anesthesia Plan  ASA: III  Anesthesia Plan: General   Post-op Pain Management:    Induction: Intravenous  PONV Risk Score and Plan: Ondansetron and Dexamethasone  Airway Management Planned: Oral ETT  Additional Equipment:   Intra-op Plan:   Post-operative Plan: Extubation in OR  Informed Consent: I have reviewed the patients History and Physical, chart, labs and discussed the procedure including the risks, benefits and alternatives for the proposed anesthesia with the patient or authorized representative who has indicated his/her understanding and acceptance.   Dental advisory given  Plan Discussed with: CRNA and Anesthesiologist  Anesthesia Plan Comments: (Pt follows with Cardiology for moderate CAD and paroxysmal afib. He saw Dr. Acie Fredrickson 12/04/2017 and per his note: "The patient needs to have some back surgery and also needs to have banding of some hemorrhoids.  He is at low risk for each of these procedures.  It will be okay for him to hold his Eliquis for 2 to 3 days prior to these procedures and for several days after the procedure as needed.  He remains in sinus rhythm and his risk of stroke is very low.")       Anesthesia Quick Evaluation

## 2017-12-10 NOTE — Progress Notes (Signed)
       [facility] Pre-operative Risk Assessment    Request for surgical clearance:  1. What type of surgery is being performed? Rubber band ligation of internal hemorrhoids   2. When is this surgery scheduled? TBD, will likely  Need 2-3 separate office visits to band all columns of hemorrhoids   3. What type of clearance is required (medical clearance vs. Pharmacy clearance to hold med vs. Both)?  Both  4. Are there any medications that need to be held prior to surgery and how long? Eliquis 2-3 days prior to procedure and for 5 days following    5. Practice name and name of physician performing surgery? [facility]   6. What is your office phone number [phone]    7.   What is your office fax number [phone]  8.   Anesthesia type (None, local, MAC, general) ? Not Indicated   Logan Sanders 12/10/2017, 12:57 PM  _________________________________________________________________   (provider comments below)     I saw Logan Sanders several weeks ago for routine visit.  He informed me that he would likely need banding of some internal hemorrhoids soon.  I received a fax from Logan Sanders, [facility] from [facility] requesting cardiology clearance.  Logan Sanders is doing very well.  He is at low risk for hemorrhoid banding.  He may stop the Eliquis for 2 to 3 days prior to the procedure and for 5 days following the procedure to allow for adequate clotting of the hemorrhoids. He is a good candidate for hemorrhoidal banding.

## 2017-12-16 ENCOUNTER — Ambulatory Visit (HOSPITAL_BASED_OUTPATIENT_CLINIC_OR_DEPARTMENT_OTHER): Payer: Medicare Other

## 2017-12-16 ENCOUNTER — Other Ambulatory Visit: Payer: Self-pay

## 2017-12-16 DIAGNOSIS — E785 Hyperlipidemia, unspecified: Secondary | ICD-10-CM | POA: Diagnosis not present

## 2017-12-16 DIAGNOSIS — I2510 Atherosclerotic heart disease of native coronary artery without angina pectoris: Secondary | ICD-10-CM | POA: Insufficient documentation

## 2017-12-16 DIAGNOSIS — Z79899 Other long term (current) drug therapy: Secondary | ICD-10-CM | POA: Diagnosis not present

## 2017-12-16 DIAGNOSIS — I493 Ventricular premature depolarization: Secondary | ICD-10-CM | POA: Insufficient documentation

## 2017-12-16 DIAGNOSIS — Z96652 Presence of left artificial knee joint: Secondary | ICD-10-CM | POA: Diagnosis not present

## 2017-12-16 DIAGNOSIS — M48062 Spinal stenosis, lumbar region with neurogenic claudication: Secondary | ICD-10-CM | POA: Diagnosis present

## 2017-12-16 DIAGNOSIS — E039 Hypothyroidism, unspecified: Secondary | ICD-10-CM | POA: Diagnosis not present

## 2017-12-16 DIAGNOSIS — M5416 Radiculopathy, lumbar region: Secondary | ICD-10-CM | POA: Diagnosis not present

## 2017-12-16 DIAGNOSIS — I480 Paroxysmal atrial fibrillation: Secondary | ICD-10-CM | POA: Diagnosis not present

## 2017-12-16 DIAGNOSIS — Z87891 Personal history of nicotine dependence: Secondary | ICD-10-CM | POA: Diagnosis not present

## 2017-12-16 DIAGNOSIS — N400 Enlarged prostate without lower urinary tract symptoms: Secondary | ICD-10-CM | POA: Diagnosis not present

## 2017-12-16 DIAGNOSIS — I10 Essential (primary) hypertension: Secondary | ICD-10-CM | POA: Diagnosis not present

## 2017-12-18 ENCOUNTER — Ambulatory Visit (HOSPITAL_COMMUNITY): Payer: Medicare Other | Admitting: Registered Nurse

## 2017-12-18 ENCOUNTER — Ambulatory Visit (HOSPITAL_COMMUNITY): Payer: Medicare Other

## 2017-12-18 ENCOUNTER — Ambulatory Visit (HOSPITAL_COMMUNITY): Payer: Medicare Other | Admitting: Physician Assistant

## 2017-12-18 ENCOUNTER — Encounter (HOSPITAL_COMMUNITY)
Admission: AD | Disposition: A | Discharge status: Home / Self Care | Payer: Self-pay | Source: Ambulatory Visit | Attending: Neurosurgery

## 2017-12-18 ENCOUNTER — Inpatient Hospital Stay (HOSPITAL_COMMUNITY)
Admission: AD | Admit: 2017-12-18 | Discharge: 2017-12-19 | DRG: 517 | Disposition: A | Discharge status: Home / Self Care | Payer: Medicare Other | Source: Ambulatory Visit | Attending: Neurosurgery | Admitting: Neurosurgery

## 2017-12-18 ENCOUNTER — Encounter (HOSPITAL_COMMUNITY): Payer: Self-pay | Admitting: *Deleted

## 2017-12-18 DIAGNOSIS — M5416 Radiculopathy, lumbar region: Secondary | ICD-10-CM | POA: Diagnosis not present

## 2017-12-18 DIAGNOSIS — I10 Essential (primary) hypertension: Secondary | ICD-10-CM | POA: Diagnosis not present

## 2017-12-18 DIAGNOSIS — Z96652 Presence of left artificial knee joint: Secondary | ICD-10-CM | POA: Diagnosis not present

## 2017-12-18 DIAGNOSIS — M48062 Spinal stenosis, lumbar region with neurogenic claudication: Principal | ICD-10-CM | POA: Diagnosis present

## 2017-12-18 DIAGNOSIS — E785 Hyperlipidemia, unspecified: Secondary | ICD-10-CM | POA: Diagnosis not present

## 2017-12-18 DIAGNOSIS — I2510 Atherosclerotic heart disease of native coronary artery without angina pectoris: Secondary | ICD-10-CM | POA: Diagnosis present

## 2017-12-18 DIAGNOSIS — Z419 Encounter for procedure for purposes other than remedying health state, unspecified: Secondary | ICD-10-CM

## 2017-12-18 DIAGNOSIS — N400 Enlarged prostate without lower urinary tract symptoms: Secondary | ICD-10-CM | POA: Diagnosis not present

## 2017-12-18 DIAGNOSIS — Z87891 Personal history of nicotine dependence: Secondary | ICD-10-CM

## 2017-12-18 DIAGNOSIS — E039 Hypothyroidism, unspecified: Secondary | ICD-10-CM | POA: Diagnosis not present

## 2017-12-18 DIAGNOSIS — Z79899 Other long term (current) drug therapy: Secondary | ICD-10-CM

## 2017-12-18 HISTORY — PX: LUMBAR LAMINECTOMY/DECOMPRESSION MICRODISCECTOMY: SHX5026

## 2017-12-18 LAB — PROTIME-INR
INR: 1.01
Prothrombin Time: 13.2 seconds (ref 11.4–15.2)

## 2017-12-18 SURGERY — LUMBAR LAMINECTOMY/DECOMPRESSION MICRODISCECTOMY 3 LEVELS
Anesthesia: General | Site: Spine Lumbar

## 2017-12-18 MED ORDER — BUPIVACAINE-EPINEPHRINE (PF) 0.25% -1:200000 IJ SOLN
INTRAMUSCULAR | Status: AC
  Filled 2017-12-18: qty 30

## 2017-12-18 MED ORDER — FENTANYL CITRATE (PF) 100 MCG/2ML IJ SOLN
INTRAMUSCULAR | Status: DC | PRN
  Administered 2017-12-18: 50 ug via INTRAVENOUS
  Administered 2017-12-18: 100 ug via INTRAVENOUS

## 2017-12-18 MED ORDER — SUGAMMADEX SODIUM 200 MG/2ML IV SOLN
INTRAVENOUS | Status: DC | PRN
  Administered 2017-12-18 (×2): 100 mg via INTRAVENOUS

## 2017-12-18 MED ORDER — THROMBIN 5000 UNITS EX SOLR
OROMUCOSAL | Status: DC | PRN
  Administered 2017-12-18: 5 mL via TOPICAL

## 2017-12-18 MED ORDER — BACITRACIN ZINC 500 UNIT/GM EX OINT
TOPICAL_OINTMENT | CUTANEOUS | Status: AC
  Filled 2017-12-18: qty 28.35

## 2017-12-18 MED ORDER — MIDAZOLAM HCL 5 MG/5ML IJ SOLN
INTRAMUSCULAR | Status: DC | PRN
  Administered 2017-12-18: 2 mg via INTRAVENOUS

## 2017-12-18 MED ORDER — MENTHOL 3 MG MT LOZG: 1.0000 | LOZENGE | OROMUCOSAL | Status: DC | PRN

## 2017-12-18 MED ORDER — ACETAMINOPHEN 325 MG PO TABS
650.0000 mg | ORAL_TABLET | ORAL | Status: DC | PRN
  Administered 2017-12-19: 650 mg via ORAL
  Filled 2017-12-18: qty 2

## 2017-12-18 MED ORDER — ONDANSETRON HCL 4 MG/2ML IJ SOLN
INTRAMUSCULAR | Status: DC | PRN
  Administered 2017-12-18: 4 mg via INTRAVENOUS

## 2017-12-18 MED ORDER — ATORVASTATIN CALCIUM 40 MG PO TABS
40.0000 mg | ORAL_TABLET | Freq: Every day | ORAL | Status: DC
  Administered 2017-12-18: 40 mg via ORAL
  Filled 2017-12-18: qty 1

## 2017-12-18 MED ORDER — DOCUSATE SODIUM 100 MG PO CAPS
100.0000 mg | ORAL_CAPSULE | Freq: Two times a day (BID) | ORAL | Status: DC
  Administered 2017-12-18 – 2017-12-19 (×2): 100 mg via ORAL
  Filled 2017-12-18 (×2): qty 1

## 2017-12-18 MED ORDER — THROMBIN 5000 UNITS EX SOLR
CUTANEOUS | Status: AC
  Filled 2017-12-18: qty 15000

## 2017-12-18 MED ORDER — BISACODYL 10 MG RE SUPP: 10.0000 mg | Freq: Every day | RECTAL | Status: DC | PRN

## 2017-12-18 MED ORDER — OXYCODONE HCL 5 MG PO TABS
5.0000 mg | ORAL_TABLET | ORAL | Status: DC | PRN
  Administered 2017-12-18 (×2): 5 mg via ORAL
  Filled 2017-12-18 (×2): qty 1

## 2017-12-18 MED ORDER — SODIUM CHLORIDE 0.9% FLUSH: 3.0000 mL | INTRAVENOUS | Status: DC | PRN

## 2017-12-18 MED ORDER — CHLORHEXIDINE GLUCONATE CLOTH 2 % EX PADS: 6.0000 | MEDICATED_PAD | Freq: Once | CUTANEOUS | Status: DC

## 2017-12-18 MED ORDER — EPHEDRINE 5 MG/ML INJ
INTRAVENOUS | Status: AC
  Filled 2017-12-18: qty 10

## 2017-12-18 MED ORDER — FENTANYL CITRATE (PF) 100 MCG/2ML IJ SOLN
INTRAMUSCULAR | Status: AC
  Filled 2017-12-18: qty 2

## 2017-12-18 MED ORDER — ROCURONIUM BROMIDE 50 MG/5ML IV SOSY
PREFILLED_SYRINGE | INTRAVENOUS | Status: DC | PRN
  Administered 2017-12-18: 50 mg via INTRAVENOUS
  Administered 2017-12-18: 20 mg via INTRAVENOUS
  Administered 2017-12-18: 30 mg via INTRAVENOUS

## 2017-12-18 MED ORDER — LIDOCAINE 2% (20 MG/ML) 5 ML SYRINGE
INTRAMUSCULAR | Status: DC | PRN
  Administered 2017-12-18: 40 mg via INTRAVENOUS

## 2017-12-18 MED ORDER — PHENOL 1.4 % MT LIQD: 1.0000 | OROMUCOSAL | Status: DC | PRN

## 2017-12-18 MED ORDER — MORPHINE SULFATE (PF) 4 MG/ML IV SOLN: 4.0000 mg | INTRAVENOUS | Status: DC | PRN

## 2017-12-18 MED ORDER — DOXAZOSIN MESYLATE 8 MG PO TABS
8.0000 mg | ORAL_TABLET | Freq: Every day | ORAL | Status: DC
  Administered 2017-12-18: 8 mg via ORAL
  Filled 2017-12-18 (×2): qty 1

## 2017-12-18 MED ORDER — HYDROCHLOROTHIAZIDE 12.5 MG PO CAPS
12.5000 mg | ORAL_CAPSULE | Freq: Every day | ORAL | Status: DC
  Administered 2017-12-18: 12.5 mg via ORAL
  Filled 2017-12-18 (×2): qty 1

## 2017-12-18 MED ORDER — FENTANYL CITRATE (PF) 100 MCG/2ML IJ SOLN
25.0000 ug | INTRAMUSCULAR | Status: DC | PRN
  Administered 2017-12-18 (×4): 25 ug via INTRAVENOUS

## 2017-12-18 MED ORDER — ACETAMINOPHEN 650 MG RE SUPP: 650.0000 mg | RECTAL | Status: DC | PRN

## 2017-12-18 MED ORDER — CARVEDILOL 6.25 MG PO TABS
6.2500 mg | ORAL_TABLET | Freq: Two times a day (BID) | ORAL | Status: DC
  Administered 2017-12-19: 6.25 mg via ORAL
  Filled 2017-12-18: qty 1

## 2017-12-18 MED ORDER — LACTATED RINGERS IV SOLN
INTRAVENOUS | Status: DC
  Administered 2017-12-18 (×2): via INTRAVENOUS

## 2017-12-18 MED ORDER — OXYCODONE HCL 5 MG PO TABS: 5.0000 mg | ORAL_TABLET | Freq: Once | ORAL | Status: DC | PRN

## 2017-12-18 MED ORDER — ONDANSETRON HCL 4 MG/2ML IJ SOLN: 4.0000 mg | Freq: Four times a day (QID) | INTRAMUSCULAR | Status: DC | PRN

## 2017-12-18 MED ORDER — SODIUM CHLORIDE 0.9 % IV SOLN
INTRAVENOUS | Status: DC | PRN
  Administered 2017-12-18: 25 ug/min via INTRAVENOUS

## 2017-12-18 MED ORDER — ADULT MULTIVITAMIN W/MINERALS CH
1.0000 | ORAL_TABLET | Freq: Every day | ORAL | Status: DC
  Filled 2017-12-18: qty 1

## 2017-12-18 MED ORDER — PANTOPRAZOLE SODIUM 40 MG PO TBEC
80.0000 mg | DELAYED_RELEASE_TABLET | Freq: Every day | ORAL | Status: DC
  Filled 2017-12-18: qty 2

## 2017-12-18 MED ORDER — CEFAZOLIN SODIUM-DEXTROSE 2-4 GM/100ML-% IV SOLN
2.0000 g | INTRAVENOUS | Status: AC
  Administered 2017-12-18: 2 g via INTRAVENOUS

## 2017-12-18 MED ORDER — PROPOFOL 10 MG/ML IV BOLUS
INTRAVENOUS | Status: DC | PRN
  Administered 2017-12-18: 180 mg via INTRAVENOUS

## 2017-12-18 MED ORDER — DEXAMETHASONE SODIUM PHOSPHATE 10 MG/ML IJ SOLN
INTRAMUSCULAR | Status: AC
  Filled 2017-12-18: qty 1

## 2017-12-18 MED ORDER — CO-ENZYME Q-10 100 MG PO CAPS: 100.0000 mg | ORAL_CAPSULE | Freq: Every day | ORAL | Status: DC

## 2017-12-18 MED ORDER — SODIUM CHLORIDE 0.9 % IV SOLN
INTRAVENOUS | Status: DC | PRN
  Administered 2017-12-18: 500 mL

## 2017-12-18 MED ORDER — FENTANYL CITRATE (PF) 250 MCG/5ML IJ SOLN
INTRAMUSCULAR | Status: AC
  Filled 2017-12-18: qty 5

## 2017-12-18 MED ORDER — ONDANSETRON HCL 4 MG/2ML IJ SOLN
INTRAMUSCULAR | Status: AC
  Filled 2017-12-18: qty 2

## 2017-12-18 MED ORDER — CEFAZOLIN SODIUM-DEXTROSE 2-4 GM/100ML-% IV SOLN
INTRAVENOUS | Status: AC
  Filled 2017-12-18: qty 100

## 2017-12-18 MED ORDER — ACETAMINOPHEN 500 MG PO TABS
1000.0000 mg | ORAL_TABLET | Freq: Four times a day (QID) | ORAL | Status: AC
  Administered 2017-12-18 – 2017-12-19 (×4): 1000 mg via ORAL
  Filled 2017-12-18 (×4): qty 2

## 2017-12-18 MED ORDER — OLMESARTAN MEDOXOMIL-HCTZ 40-12.5 MG PO TABS: 1.0000 | ORAL_TABLET | Freq: Every day | ORAL | Status: DC

## 2017-12-18 MED ORDER — ONDANSETRON HCL 4 MG PO TABS: 4.0000 mg | ORAL_TABLET | Freq: Four times a day (QID) | ORAL | Status: DC | PRN

## 2017-12-18 MED ORDER — CYCLOBENZAPRINE HCL 10 MG PO TABS
10.0000 mg | ORAL_TABLET | Freq: Three times a day (TID) | ORAL | Status: DC | PRN
  Administered 2017-12-18 – 2017-12-19 (×2): 10 mg via ORAL
  Filled 2017-12-18 (×2): qty 1

## 2017-12-18 MED ORDER — LORATADINE 10 MG PO TABS
10.0000 mg | ORAL_TABLET | Freq: Every day | ORAL | Status: DC
  Filled 2017-12-18: qty 1

## 2017-12-18 MED ORDER — AMLODIPINE BESYLATE 5 MG PO TABS
5.0000 mg | ORAL_TABLET | Freq: Every day | ORAL | Status: DC
  Administered 2017-12-18: 5 mg via ORAL
  Filled 2017-12-18 (×2): qty 1

## 2017-12-18 MED ORDER — BUPIVACAINE-EPINEPHRINE (PF) 0.25% -1:200000 IJ SOLN
INTRAMUSCULAR | Status: DC | PRN
  Administered 2017-12-18: 10 mL via PERINEURAL

## 2017-12-18 MED ORDER — OXYCODONE HCL 5 MG PO TABS
10.0000 mg | ORAL_TABLET | ORAL | Status: DC | PRN
  Administered 2017-12-19 (×4): 10 mg via ORAL
  Filled 2017-12-18 (×4): qty 2

## 2017-12-18 MED ORDER — DEXAMETHASONE SODIUM PHOSPHATE 10 MG/ML IJ SOLN
INTRAMUSCULAR | Status: DC | PRN
  Administered 2017-12-18: 10 mg via INTRAVENOUS

## 2017-12-18 MED ORDER — 0.9 % SODIUM CHLORIDE (POUR BTL) OPTIME
TOPICAL | Status: DC | PRN
  Administered 2017-12-18: 1000 mL

## 2017-12-18 MED ORDER — PROPOFOL 10 MG/ML IV BOLUS
INTRAVENOUS | Status: AC
  Filled 2017-12-18: qty 20

## 2017-12-18 MED ORDER — BACITRACIN ZINC 500 UNIT/GM EX OINT
TOPICAL_OINTMENT | CUTANEOUS | Status: DC | PRN
  Administered 2017-12-18: 1 via TOPICAL

## 2017-12-18 MED ORDER — CENTRUM PO CHEW: 1.0000 | CHEWABLE_TABLET | Freq: Every day | ORAL | Status: DC

## 2017-12-18 MED ORDER — LEVOTHYROXINE SODIUM 100 MCG PO TABS
100.0000 ug | ORAL_TABLET | Freq: Every day | ORAL | Status: DC
  Administered 2017-12-19: 100 ug via ORAL
  Filled 2017-12-18: qty 1

## 2017-12-18 MED ORDER — SODIUM CHLORIDE 0.9% FLUSH
3.0000 mL | Freq: Two times a day (BID) | INTRAVENOUS | Status: DC
  Administered 2017-12-18: 3 mL via INTRAVENOUS

## 2017-12-18 MED ORDER — ONDANSETRON HCL 4 MG/2ML IJ SOLN: 4.0000 mg | Freq: Once | INTRAMUSCULAR | Status: DC | PRN

## 2017-12-18 MED ORDER — ROCURONIUM BROMIDE 50 MG/5ML IV SOSY
PREFILLED_SYRINGE | INTRAVENOUS | Status: AC
  Filled 2017-12-18: qty 10

## 2017-12-18 MED ORDER — EPHEDRINE SULFATE-NACL 50-0.9 MG/10ML-% IV SOSY
PREFILLED_SYRINGE | INTRAVENOUS | Status: DC | PRN
  Administered 2017-12-18: 10 mg via INTRAVENOUS

## 2017-12-18 MED ORDER — IRBESARTAN 300 MG PO TABS
300.0000 mg | ORAL_TABLET | Freq: Every day | ORAL | Status: DC
  Administered 2017-12-18: 300 mg via ORAL
  Filled 2017-12-18 (×2): qty 1

## 2017-12-18 MED ORDER — LIDOCAINE 2% (20 MG/ML) 5 ML SYRINGE
INTRAMUSCULAR | Status: AC
  Filled 2017-12-18: qty 5

## 2017-12-18 MED ORDER — OXYCODONE HCL 5 MG/5ML PO SOLN: 5.0000 mg | Freq: Once | ORAL | Status: DC | PRN

## 2017-12-18 MED ORDER — CEFAZOLIN SODIUM-DEXTROSE 2-4 GM/100ML-% IV SOLN
2.0000 g | Freq: Three times a day (TID) | INTRAVENOUS | Status: AC
  Administered 2017-12-18 – 2017-12-19 (×2): 2 g via INTRAVENOUS
  Filled 2017-12-18 (×2): qty 100

## 2017-12-18 MED ORDER — SODIUM CHLORIDE 0.9 % IV SOLN: 250.0000 mL | INTRAVENOUS | Status: DC

## 2017-12-18 MED ORDER — BUPIVACAINE LIPOSOME 1.3 % IJ SUSP
20.0000 mL | INTRAMUSCULAR | Status: AC
  Administered 2017-12-18: 20 mL
  Filled 2017-12-18: qty 20

## 2017-12-18 MED ORDER — MIDAZOLAM HCL 2 MG/2ML IJ SOLN
INTRAMUSCULAR | Status: AC
  Filled 2017-12-18: qty 2

## 2017-12-18 SURGICAL SUPPLY — 62 items
ADH SKN CLS APL DERMABOND .7 (GAUZE/BANDAGES/DRESSINGS) ×1
APL SKNCLS STERI-STRIP NONHPOA (GAUZE/BANDAGES/DRESSINGS) ×1
BAG DECANTER FOR FLEXI CONT (MISCELLANEOUS) ×3 IMPLANT
BENZOIN TINCTURE PRP APPL 2/3 (GAUZE/BANDAGES/DRESSINGS) ×3 IMPLANT
BLADE CLIPPER SURG (BLADE) IMPLANT
BUR MATCHSTICK NEURO 3.0 LAGG (BURR) ×3 IMPLANT
BUR PRECISION FLUTE 6.0 (BURR) ×3 IMPLANT
CANISTER SUCT 3000ML PPV (MISCELLANEOUS) ×3 IMPLANT
CARTRIDGE OIL MAESTRO DRILL (MISCELLANEOUS) ×1 IMPLANT
CLOSURE STERI-STRIP 1/2X4 (GAUZE/BANDAGES/DRESSINGS) ×1
CLOSURE WOUND 1/2 X4 (GAUZE/BANDAGES/DRESSINGS) ×1
CLSR STERI-STRIP ANTIMIC 1/2X4 (GAUZE/BANDAGES/DRESSINGS) ×1 IMPLANT
COVER WAND RF STERILE (DRAPES) ×3 IMPLANT
DERMABOND ADVANCED (GAUZE/BANDAGES/DRESSINGS) ×2
DERMABOND ADVANCED .7 DNX12 (GAUZE/BANDAGES/DRESSINGS) ×1 IMPLANT
DIFFUSER DRILL AIR PNEUMATIC (MISCELLANEOUS) ×3 IMPLANT
DRAPE LAPAROTOMY 100X72X124 (DRAPES) ×3 IMPLANT
DRAPE MICROSCOPE LEICA (MISCELLANEOUS) ×3 IMPLANT
DRAPE POUCH INSTRU U-SHP 10X18 (DRAPES) ×3 IMPLANT
DRAPE SURG 17X23 STRL (DRAPES) ×12 IMPLANT
DRSG OPSITE POSTOP 4X6 (GAUZE/BANDAGES/DRESSINGS) ×2 IMPLANT
ELECT BLADE 4.0 EZ CLEAN MEGAD (MISCELLANEOUS) ×3
ELECT REM PT RETURN 9FT ADLT (ELECTROSURGICAL) ×3
ELECTRODE BLDE 4.0 EZ CLN MEGD (MISCELLANEOUS) ×1 IMPLANT
ELECTRODE REM PT RTRN 9FT ADLT (ELECTROSURGICAL) ×1 IMPLANT
EVACUATOR 1/8 PVC DRAIN (DRAIN) ×2 IMPLANT
GAUZE 4X4 16PLY RFD (DISPOSABLE) IMPLANT
GAUZE SPONGE 4X4 12PLY STRL (GAUZE/BANDAGES/DRESSINGS) ×1 IMPLANT
GLOVE BIO SURGEON STRL SZ 6.5 (GLOVE) ×5 IMPLANT
GLOVE BIO SURGEON STRL SZ8 (GLOVE) ×3 IMPLANT
GLOVE BIO SURGEON STRL SZ8.5 (GLOVE) ×3 IMPLANT
GLOVE BIO SURGEONS STRL SZ 6.5 (GLOVE) ×5
GLOVE BIOGEL PI IND STRL 6.5 (GLOVE) IMPLANT
GLOVE BIOGEL PI IND STRL 8 (GLOVE) IMPLANT
GLOVE BIOGEL PI INDICATOR 6.5 (GLOVE) ×6
GLOVE BIOGEL PI INDICATOR 8 (GLOVE) ×4
GLOVE EXAM NITRILE XL STR (GLOVE) IMPLANT
GOWN STRL REUS W/ TWL LRG LVL3 (GOWN DISPOSABLE) IMPLANT
GOWN STRL REUS W/ TWL XL LVL3 (GOWN DISPOSABLE) ×1 IMPLANT
GOWN STRL REUS W/TWL 2XL LVL3 (GOWN DISPOSABLE) ×3 IMPLANT
GOWN STRL REUS W/TWL LRG LVL3 (GOWN DISPOSABLE) ×6
GOWN STRL REUS W/TWL XL LVL3 (GOWN DISPOSABLE) ×3
HEMOSTAT POWDER KIT SURGIFOAM (HEMOSTASIS) ×2 IMPLANT
KIT BASIN OR (CUSTOM PROCEDURE TRAY) ×3 IMPLANT
KIT TURNOVER KIT B (KITS) ×3 IMPLANT
NDL HYPO 21X1.5 SAFETY (NEEDLE) IMPLANT
NEEDLE HYPO 21X1.5 SAFETY (NEEDLE) ×3 IMPLANT
NEEDLE HYPO 22GX1.5 SAFETY (NEEDLE) ×3 IMPLANT
NS IRRIG 1000ML POUR BTL (IV SOLUTION) ×3 IMPLANT
OIL CARTRIDGE MAESTRO DRILL (MISCELLANEOUS) ×3
PACK LAMINECTOMY NEURO (CUSTOM PROCEDURE TRAY) ×3 IMPLANT
PAD ARMBOARD 7.5X6 YLW CONV (MISCELLANEOUS) ×9 IMPLANT
PATTIES SURGICAL .5 X1 (DISPOSABLE) IMPLANT
RUBBERBAND STERILE (MISCELLANEOUS) ×6 IMPLANT
SPONGE SURGIFOAM ABS GEL SZ50 (HEMOSTASIS) ×1 IMPLANT
STRIP CLOSURE SKIN 1/2X4 (GAUZE/BANDAGES/DRESSINGS) ×2 IMPLANT
SUT VIC AB 1 CT1 18XBRD ANBCTR (SUTURE) ×2 IMPLANT
SUT VIC AB 1 CT1 8-18 (SUTURE) ×6
SUT VIC AB 2-0 CP2 18 (SUTURE) ×6 IMPLANT
TOWEL GREEN STERILE (TOWEL DISPOSABLE) ×3 IMPLANT
TOWEL GREEN STERILE FF (TOWEL DISPOSABLE) ×3 IMPLANT
WATER STERILE IRR 1000ML POUR (IV SOLUTION) ×3 IMPLANT

## 2017-12-18 NOTE — Op Note (Signed)
Brief history: The patient is a 72 year old white male whose had previous back surgeries.  He has developed recurrent back, buttock and leg pain consistent with neurogenic claudication.  He has failed medical management and was worked up with a lumbar myelo CT which demonstrated multilevel degenerative changes and spinal stenosis most prominent at L2-3, L3-4 and L4-5.  I discussed the various treatment options with the patient including surgery.  He has weighed the risks, benefits, and alternatives of surgery and decided to proceed with an L2-3, L3-4 and L4-5 laminectomy/laminotomy/foraminotomies.  Preoperative diagnosis: L2-3, L3-4 and L4-5 spinal stenosis, lumbago, lumbar radiculopathy, neurogenic claudication  Postoperative diagnosis: The same  Procedure: Bilateral L3-4 and L4-5 laminectomy and foraminotomies, bilateral L2-3 laminotomy/foraminotomies using micro-dissection  Surgeon: Dr. Earle Gell  Asst.: Arnetha Massy, RN nurse practitioner  Anesthesia: Gen. endotracheal  Estimated blood loss: 125 cc  Drains: One medium Hemovac in the epidural space  Complications: None  Description of procedure: The patient was brought to the operating room by the anesthesia team. General endotracheal anesthesia was induced. The patient was turned to the prone position on the Wilson frame. The patient's lumbosacral region was then prepared with Betadine scrub and Betadine solution. Sterile drapes were applied.  I then injected the area to be incised with Marcaine with epinephrine solution. I then used a scalpel to make a linear midline incision over the L2-3, L3-4 and L4-5 intervertebral disc space. I then used electrocautery to perform a bilateral subperiosteal dissection exposing the spinous process and lamina of L2, L3, L4 and L5. We obtained intraoperative radiograph to confirm our location. I then inserted the [facility] retractor for exposure.  We then brought the operative microscope into the  field. Under its magnification and illumination we completed the microdissection.  I used a Leksell rongeur to remove the L4, and L3 spinous process as well as the caudal aspect of the L2 spinous process.  I used a high-speed drill to perform bilateral laminotomies at L2-3, L3-4 and L4-5 I then used a Kerrison punches to complete the L3-4 and L4-5 laminectomy.  I used the Kerrison punches to widen the bilateral laminotomies at L2-3 and removed the ligamentum flavum at L2-3, L3-4 and L4-5. We then used microdissection to free up the thecal sac and the bilateral L3, L4 and L5 nerve root from the epidural tissue. I then used a Kerrison punch to perform a foraminotomy at about the L3, L4 and L5 nerve root.  We inspected the intervertebral disc bilaterally at L2-3, L3-4 and L4-5.  There were no herniations.  I then palpated along the ventral surface of the thecal sac and along exit route of the bilateral L3, L4 and L5 nerve root and noted that the neural structures were well decompressed. This completed the decompression.  We then obtained hemostasis using bipolar electrocautery. We irrigated the wound out with bacitracin solution. We then removed the retractor.  We placed a medium Hemovac drain in the epidural space and tunneled it out through a separate stab wound.  We then reapproximated the patient's thoracolumbar fascia with interrupted #1 Vicryl suture. We then reapproximated the patient's subcutaneous tissue with interrupted 2-0 Vicryl suture. We then reapproximated patient's skin with Steri-Strips and benzoin. The was then coated with bacitracin ointment. The drapes were removed. The patient was subsequently returned to the supine position where they were extubated by the anesthesia team. The patient was then transported to the postanesthesia care unit in stable condition. All sponge instrument and needle counts were reportedly correct at  the end of this case.

## 2017-12-18 NOTE — Anesthesia Postprocedure Evaluation (Signed)
Anesthesia Post Note  Patient: Logan Sanders  Procedure(s) Performed: LAMINECTOMY AND FORAMINOTOMY LUMBAR TWO- LUMBAR THREE, LUMBAR THREE- LUMBAR FOUR, LUMBAR FOUR- LUMBAR FIVE (N/A Spine Lumbar)     Patient location during evaluation: PACU Anesthesia Type: General Level of consciousness: awake and alert Pain management: pain level controlled Vital Signs Assessment: post-procedure vital signs reviewed and stable Respiratory status: spontaneous breathing, nonlabored ventilation, respiratory function stable and patient connected to nasal cannula oxygen Cardiovascular status: blood pressure returned to baseline and stable Postop Assessment: no apparent nausea or vomiting Anesthetic complications: no    Last Vitals:  Vitals:   12/18/17 1606 12/18/17 1938  BP: (!) 136/53 (!) 115/53  Pulse: (!) 59 (!) 53  Resp: 14 20  Temp: (!) 36.4 C 36.7 C  SpO2: 99% 96%    Last Pain:  Vitals:   12/18/17 2036  TempSrc:   PainSc: 6                  Demarr Kluever COKER

## 2017-12-18 NOTE — Transfer of Care (Signed)
Immediate Anesthesia Transfer of Care Note  Patient: Logan Sanders  Procedure(s) Performed: LAMINECTOMY AND FORAMINOTOMY LUMBAR TWO- LUMBAR THREE, LUMBAR THREE- LUMBAR FOUR, LUMBAR FOUR- LUMBAR FIVE (N/A Spine Lumbar)  Patient Location: PACU  Anesthesia Type:General  Level of Consciousness: awake and drowsy  Airway & Oxygen Therapy: Patient Spontanous Breathing and Patient connected to face mask oxygen  Post-op Assessment: Report given to RN and Post -op Vital signs reviewed and stable  Post vital signs: Reviewed and stable  Last Vitals:  Vitals Value Taken Time  BP 113/42 12/18/2017  2:23 PM  Temp    Pulse 62 12/18/2017  2:24 PM  Resp 20 12/18/2017  2:24 PM  SpO2 100 % 12/18/2017  2:24 PM  Vitals shown include unvalidated device data.  Last Pain:  Vitals:   12/18/17 0830  TempSrc:   PainSc: 6          Complications: No apparent anesthesia complications

## 2017-12-18 NOTE — H&P (Signed)
Subjective: The patient is a 72 year old white male who is had chronic back and leg pain.  He has failed medical management.  He was worked up with a lumbar myelo CT which demonstrated multilevel stenosis.  I discussed the various treatment options with him.  He has decided to proceed with surgery.  Past Medical History:  Diagnosis Date  . BPH (benign prostatic hyperplasia)   . CAD in native artery   . Complication of anesthesia    gets really anxious when waking up  . ED (erectile dysfunction)   . Hyperlipidemia   . Hypertension   . Hypothyroidism   . Osteoarthritis   . Thyroid disease     Past Surgical History:  Procedure Laterality Date  . CARDIAC CATHETERIZATION  11/09/13   non obstructive disease in all vessels.  . COLONOSCOPY    . KNEE ARTHROSCOPY Left    x3  . LEFT HEART CATHETERIZATION WITH CORONARY ANGIOGRAM N/A 11/09/2013   Procedure: LEFT HEART CATHETERIZATION WITH CORONARY ANGIOGRAM;  Surgeon: Blane Ohara, MD;  Location: [facility] CATH LAB;  Service: Cardiovascular;  Laterality: N/A;  . LOWER BACK SURGERY  1985  . NECK SURGERY  1999  . REPLACEMENT TOTAL KNEE  2005   LEFT KNEE  . WISDOM TOOTH EXTRACTION      No Known Allergies  Social History   Tobacco Use  . Smoking status: Former Smoker    Last attempt to quit: 05/06/1999    Years since quitting: 18.6  . Smokeless tobacco: Former Systems developer    Quit date: 07/28/1995  Substance Use Topics  . Alcohol use: Yes    Alcohol/week: 1.0 standard drinks    Types: 1 Cans of beer per week    Comment: occasionally    Family History  Problem Relation Age of Onset  . Asthma Mother   . Parkinsonism Mother   . Heart disease Father   . Heart attack Father   . Prostate cancer Father   . Hyperlipidemia Brother    Prior to Admission medications   Medication Sig Start Date End Date Taking? Authorizing Provider  amLODipine (NORVASC) 5 MG tablet TAKE 1 TABLET ONCE DAILY. 09/03/17  Yes Nahser, Wonda Cheng, MD  atorvastatin (LIPITOR) 40 MG  tablet TAKE 1 TABLET BY MOUTH DAILY. 05/16/17  Yes Nahser, Wonda Cheng, MD  carvedilol (COREG) 6.25 MG tablet Take 1 tablet (6.25 mg total) by mouth 2 (two) times daily. 08/22/17  Yes Nahser, Wonda Cheng, MD  Co-Enzyme Q-10 100 MG CAPS Take 100 mg by mouth daily.    Yes [provider]  doxazosin (CARDURA) 8 MG tablet Take 8 mg by mouth at bedtime.    Yes [provider]  ELIQUIS 5 MG TABS tablet TAKE 1 TABLET BY MOUTH TWICE DAILY. 11/27/17  Yes Nahser, Wonda Cheng, MD  multivitamin-iron-minerals-folic acid (CENTRUM) chewable tablet Chew 1 tablet by mouth daily.   Yes [provider]  olmesartan-hydrochlorothiazide (BENICAR HCT) 40-12.5 MG tablet Take 1 tablet by mouth daily.  09/13/16  Yes [provider]  Omega-3 Fatty Acids (FISH OIL PO) Take 1 capsule by mouth daily.   Yes [provider]  omeprazole (PRILOSEC) 40 MG capsule Take 40 mg by mouth daily as needed (for acid reflux).   Yes [provider]  SYNTHROID 100 MCG tablet Take 100 mcg by mouth daily before breakfast.  09/28/13  Yes [provider]  traZODone (DESYREL) 50 MG tablet Take 50 mg by mouth at bedtime as needed for sleep.  Yes [provider]  zolpidem (AMBIEN) 10 MG tablet Take 10 mg by mouth at bedtime as needed for sleep.  09/10/13  Yes [provider]  fexofenadine (ALLEGRA) 180 MG tablet Take 180 mg by mouth daily as needed for allergies.     [provider]     Review of Systems  Positive ROS: As above  All other systems have been reviewed and were otherwise negative with the exception of those mentioned in the HPI and as above.  Objective: Vital signs in last 24 hours: Temp:  [97.7 F (36.5 C)] 97.7 F (36.5 C) (12/18 0826) Pulse Rate:  [55] 55 (12/18 0826) Resp:  [20] 20 (12/18 0826) BP: (143)/(73) 143/73 (12/18 0826) SpO2:  [99 %] 99 % (12/18 0826) Weight:  [99 kg] 99 kg (12/18 0830) Estimated body mass index is 31.32 kg/m as  calculated from the following:   Height as of this encounter: 5\' 10"  (1.778 m).   Weight as of this encounter: 99 kg.   General Appearance: Alert Head: Normocephalic, without obvious abnormality, atraumatic Eyes: PERRL, conjunctiva/corneas clear, EOM's intact,    Ears: Normal  Throat: Normal  Neck: Cervical incision is well-healed. Back: unremarkable Lungs: Clear to auscultation bilaterally, respirations unlabored Heart: Regular rate and rhythm, no murmur, rub or gallop Abdomen: Soft, non-tender Extremities: Extremities normal, atraumatic, no cyanosis or edema Skin: unremarkable  NEUROLOGIC:   Mental status: alert and oriented,Motor Exam - grossly normal Sensory Exam - grossly normal Reflexes:  Coordination - grossly normal Gait - grossly normal Balance - grossly normal Cranial Nerves: I: smell Not tested  II: visual acuity  OS: Normal  OD: Normal   II: visual fields Full to confrontation  II: pupils Equal, round, reactive to light  III,VII: ptosis None  III,IV,VI: extraocular muscles  Full ROM  V: mastication Normal  V: facial light touch sensation  Normal  V,VII: corneal reflex  Present  VII: facial muscle function - upper  Normal  VII: facial muscle function - lower Normal  VIII: hearing Not tested  IX: soft palate elevation  Normal  IX,X: gag reflex Present  XI: trapezius strength  5/5  XI: sternocleidomastoid strength 5/5  XI: neck flexion strength  5/5  XII: tongue strength  Normal    Data Review Lab Results  Component Value Date   WBC 9.0 12/09/2017   HGB 14.2 12/09/2017   HCT 43.9 12/09/2017   MCV 91.6 12/09/2017   PLT 229 12/09/2017   Lab Results  Component Value Date   NA 137 12/09/2017   K 4.5 12/09/2017   CL 107 12/09/2017   CO2 19 (L) 12/09/2017   BUN 23 12/09/2017   CREATININE 1.47 (H) 12/09/2017   GLUCOSE 100 (H) 12/09/2017   Lab Results  Component Value Date   INR 1.01 12/18/2017    Assessment/Plan: L2-3, L3-4 and L4-5 spinal  stenosis, lumbago, lumbar radiculopathy, neurogenic claudication: I have discussed the situation with the patient and reviewed his myelo CT with him.  We have discussed the various treatment options including surgery.  I have described the surgical treatment option of an L 2- 3, L3-4 and L4-5 laminectomy/laminotomy/foraminotomy.  I have shown him surgical models.  I have given him a surgical pamphlet.  We have discussed the risks, benefits, alternatives, expected postoperative course, and likelihood of achieving our goals with surgery.  I have answered all his questions.  He has decided to proceed with surgery.   Ophelia Charter 12/18/2017 11:46 AM

## 2017-12-18 NOTE — Anesthesia Procedure Notes (Signed)
Procedure Name: Intubation Date/Time: 12/18/2017 12:00 PM Performed by: Trinna Post., CRNA Pre-anesthesia Checklist: Patient identified, Emergency Drugs available, Suction available, Patient being monitored and Timeout performed Patient Re-evaluated:Patient Re-evaluated prior to induction Oxygen Delivery Method: Circle system utilized Preoxygenation: Pre-oxygenation with 100% oxygen Induction Type: IV induction Ventilation: Mask ventilation without difficulty and Oral airway inserted - appropriate to patient size Laryngoscope Size: Mac and 4 Grade View: Grade II Tube type: Oral Tube size: 7.5 mm Number of attempts: 1 Airway Equipment and Method: Stylet Placement Confirmation: ETT inserted through vocal cords under direct vision,  positive ETCO2 and breath sounds checked- equal and bilateral Secured at: 23 cm Tube secured with: Tape Dental Injury: Teeth and Oropharynx as per pre-operative assessment

## 2017-12-18 NOTE — Progress Notes (Signed)
Subjective: The patient is alert and pleasant.  He is in no apparent distress.  He looks well.  Objective: Vital signs in last 24 hours: Temp:  [97.3 F (36.3 C)-97.7 F (36.5 C)] 97.3 F (36.3 C) (12/18 1423) Pulse Rate:  [55] 55 (12/18 0826) Resp:  [20] 20 (12/18 0826) BP: (143)/(73) 143/73 (12/18 0826) SpO2:  [99 %] 99 % (12/18 0826) Weight:  [99 kg] 99 kg (12/18 0830) Estimated body mass index is 31.32 kg/m as calculated from the following:   Height as of this encounter: 5\' 10"  (1.778 m).   Weight as of this encounter: 99 kg.   Intake/Output from previous day: No intake/output data recorded. Intake/Output this shift: Total I/O In: 1200 [I.V.:1200] Out: 300 [Blood:300]  Physical exam the patient is alert and pleasant.  He is moving his lower extremities well.  Lab Results: No results for input(s): WBC, HGB, HCT, PLT in the last 72 hours. BMET No results for input(s): NA, K, CL, CO2, GLUCOSE, BUN, CREATININE, CALCIUM in the last 72 hours.  Studies/Results: Dg Lumbar Spine 1 View  Result Date: 12/18/2017 CLINICAL DATA:  Lumbar surgery for L2-L3, L3-L4 and L4-L5 laminectomy and foraminotomy EXAM: LUMBAR SPINE - 1 VIEW COMPARISON:  Portable cross-table lateral view at 1232 hours compared to CT lumbar spine 09/30/2017 FINDINGS: Prior MRI labeled with 5 lumbar vertebra. Current exam labeled accordingly. Metallic probe via dorsal approach projects dorsal to the L3-L4 disc space level. Tissue spreader and surgical sponge are also seen dorsal to L3-L4. Disc space narrowing L4-L5 and L5-S1 as well as L2-L3. IMPRESSION: Dorsal localization of the L3-L4 disc space level. Electronically Signed   By: Lavonia Dana M.D.   On: 12/18/2017 13:16    Assessment/Plan: The patient is doing well.  I spoke with his family.  LOS: 0 days     Ophelia Charter 12/18/2017, 3:10 PM

## 2017-12-19 ENCOUNTER — Encounter (HOSPITAL_COMMUNITY): Payer: Self-pay | Admitting: Neurosurgery

## 2017-12-19 DIAGNOSIS — M48062 Spinal stenosis, lumbar region with neurogenic claudication: Secondary | ICD-10-CM | POA: Diagnosis not present

## 2017-12-19 MED ORDER — OXYCODONE HCL 5 MG PO TABS: 5.0000 mg | ORAL_TABLET | ORAL | 0 refills | Status: DC | PRN

## 2017-12-19 MED ORDER — CYCLOBENZAPRINE HCL 10 MG PO TABS: 10.0000 mg | ORAL_TABLET | Freq: Three times a day (TID) | ORAL | 1 refills | Status: DC | PRN

## 2017-12-19 MED ORDER — DOCUSATE SODIUM 100 MG PO CAPS: 100.0000 mg | ORAL_CAPSULE | Freq: Two times a day (BID) | ORAL | 0 refills | Status: DC

## 2017-12-19 NOTE — Discharge Instructions (Signed)

## 2017-12-19 NOTE — Progress Notes (Signed)
Discharge instructions, RX's and follow up appts explained and provided to patient and c/g verbalized understanding. Patient left floor via wheelchair accompanied by staff.  Favour Aleshire, Tivis Ringer, RN

## 2017-12-19 NOTE — Evaluation (Signed)
Physical Therapy Evaluation Patient Details Name: Logan Sanders MRN: [id number] DOB: 10-Aug-1945 Today's Date: 12/19/2017   History of Present Illness  Pt is a 72 y/o male who presents s/p L2-L5 laminectomy/decompression on 12/18/17.  Clinical Impression  Pt admitted with above diagnosis. Pt currently with functional limitations due to the deficits listed below (see PT Problem List). At the time of PT eval pt was able to perform transfers and ambulation with gross supervision for safety. Occasional unsteadiness noted but pt able to recover without assistance. Pt will benefit from skilled PT to increase their independence and safety with mobility to allow discharge to the venue listed below.       Follow Up Recommendations No PT follow up;Supervision for mobility/OOB    Equipment Recommendations  None recommended by PT    Recommendations for Other Services       Precautions / Restrictions Precautions Precautions: Fall;Back Precaution Booklet Issued: Yes (comment) Precaution Comments: Reviewed in detail with pt Restrictions Weight Bearing Restrictions: No      Mobility  Bed Mobility Overal bed mobility: Needs Assistance Bed Mobility: Rolling;Sidelying to Sit Rolling: Modified independent (Device/Increase time) Sidelying to sit: Supervision       General bed mobility comments: VC's for proper log roll technique. HOB flat and rails lowered to simulate home environment.   Transfers Overall transfer level: Needs assistance Equipment used: None Transfers: Sit to/from Stand Sit to Stand: Supervision         General transfer comment: Light supervision for safety. VC's for more upright posture during sit>stand to maintain precautions.   Ambulation/Gait Ambulation/Gait assistance: Supervision Gait Distance (Feet): 400 Feet Assistive device: None Gait Pattern/deviations: Step-through pattern;Decreased stride length Gait velocity: Decreased Gait velocity interpretation:  1.31 - 2.62 ft/sec, indicative of limited community ambulator General Gait Details: Occasional unsteadiness noted however pt denies feeling off balanced. No assist required to recover from instances of unsteadiness however close supervision was provided for safety.   Stairs Stairs: Yes Stairs assistance: Min guard Stair Management: One rail Left;Step to pattern;Forwards Number of Stairs: 3 General stair comments: VC's for attempt without railing as pt does not have one at home, however pt declined, stating "I'll be fine without it."  Wheelchair Mobility    Modified Rankin (Stroke Patients Only)       Balance Overall balance assessment: Mild deficits observed, not formally tested                                           Pertinent Vitals/Pain Pain Assessment: 0-10 Pain Score: 5  Pain Location: back Pain Descriptors / Indicators: Operative site guarding Pain Intervention(s): Limited activity within patient's tolerance;Monitored during session;Repositioned    Home Living Family/patient expects to be discharged to:: Private residence Living Arrangements: Spouse/significant other Available Help at Discharge: Family Type of Home: House Home Access: Stairs to enter Entrance Stairs-Rails: None Entrance Stairs-Number of Steps: 3 Home Layout: One level Home Equipment: Shower seat      Prior Function Level of Independence: Independent         Comments: Retired Airline pilot. Works now Loss adjuster, chartered buses     Journalist, newspaper        Extremity/Trunk Assessment   Upper Extremity Assessment Upper Extremity Assessment: Defer to OT evaluation    Lower Extremity Assessment Lower Extremity Assessment: LLE deficits/detail LLE Deficits / Details: Decreased strength and muscular endurance consistent with pre-op diagnosis.  Communication   Communication: No difficulties  Cognition Arousal/Alertness: Awake/alert Behavior During Therapy: WFL for tasks  assessed/performed Overall Cognitive Status: Within Functional Limits for tasks assessed                                        General Comments      Exercises     Assessment/Plan    PT Assessment Patient needs continued PT services  PT Problem List Decreased strength;Decreased activity tolerance;Decreased balance;Decreased mobility;Decreased knowledge of use of DME;Decreased safety awareness;Decreased knowledge of precautions;Pain       PT Treatment Interventions DME instruction;Gait training;Stair training;Functional mobility training;Therapeutic activities;Therapeutic exercise;Neuromuscular re-education;Patient/family education    PT Goals (Current goals can be found in the Care Plan section)  Acute Rehab PT Goals Patient Stated Goal: Go back to work in 4 weeks PT Goal Formulation: With patient Time For Goal Achievement: 12/26/17 Potential to Achieve Goals: Good    Frequency Min 5X/week   Barriers to discharge        Co-evaluation               AM-PAC PT "6 Clicks" Mobility  Outcome Measure Help needed turning from your back to your side while in a flat bed without using bedrails?: None Help needed moving from lying on your back to sitting on the side of a flat bed without using bedrails?: None Help needed moving to and from a bed to a chair (including a wheelchair)?: A Little Help needed standing up from a chair using your arms (e.g., wheelchair or bedside chair)?: A Little Help needed to walk in hospital room?: A Little Help needed climbing 3-5 steps with a railing? : A Little 6 Click Score: 20    End of Session Equipment Utilized During Treatment: Gait belt Activity Tolerance: Patient tolerated treatment well Patient left: in bed;with call bell/phone within reach Nurse Communication: Mobility status PT Visit Diagnosis: Unsteadiness on feet (R26.81);Pain Pain - part of body: (back)    Time: 2376-2831 PT Time Calculation (min) (ACUTE  ONLY): 26 min   Charges:   PT Evaluation $PT Eval Moderate Complexity: 1 Mod PT Treatments $Gait Training: 8-22 mins        Rolinda Roan, PT, DPT Acute Rehabilitation Services Pager: [phone] Office: [phone]   Thelma Comp 12/19/2017, 11:26 AM

## 2017-12-19 NOTE — Plan of Care (Signed)
  Problem: Bowel/Gastric: Goal: Gastrointestinal status for postoperative course will improve Outcome: Completed/Met   Problem: Clinical Measurements: Goal: Ability to maintain clinical measurements within normal limits will improve Outcome: Completed/Met Goal: Postoperative complications will be avoided or minimized Outcome: Completed/Met Goal: Diagnostic test results will improve Outcome: Completed/Met   Problem: Health Behavior/Discharge Planning: Goal: Identification of resources available to assist in meeting health care needs will improve Outcome: Completed/Met

## 2017-12-19 NOTE — Discharge Summary (Signed)
Physician Discharge Summary  Patient ID: Logan Sanders MRN: [id number] DOB/AGE: January 16, 1945 72 y.o.  Admit date: 12/18/2017 Discharge date: 12/19/2017  Admission Diagnoses: Lumbar spinal stenosis with neurogenic claudication, lumbago, lumbar radiculopathy  Discharge Diagnoses: The same Active Problems:   Spinal stenosis of lumbar region with neurogenic claudication   Discharged Condition: good  Hospital Course: I performed an L2-3, L3-4 and L4-5 laminectomy/laminotomy/foraminotomy on the patient on 12/18/2017.  The surgery went well.  The patient's postoperative course was unremarkable.  On postoperative day #1 he requested discharge home.  He was given written and oral discharge instructions.  All his questions were answered.  Consults: Physical therapy Significant Diagnostic Studies: None Treatments: L2-3, L3-4 and L4-5 laminectomy/laminotomy/foraminotomies using microdissection Discharge Exam: Blood pressure (!) 116/54, pulse 64, temperature 98.2 F (36.8 C), temperature source Oral, resp. rate 16, height 5\' 10"  (1.778 m), weight 99 kg, SpO2 96 %. The patient is alert and pleasant.  His strength is normal.  His dressing is clean and dry.  Disposition: Home  Discharge Instructions    Call MD for:  difficulty breathing, headache or visual disturbances   Complete by:  As directed    Call MD for:  extreme fatigue   Complete by:  As directed    Call MD for:  hives   Complete by:  As directed    Call MD for:  persistant dizziness or light-headedness   Complete by:  As directed    Call MD for:  persistant nausea and vomiting   Complete by:  As directed    Call MD for:  redness, tenderness, or signs of infection (pain, swelling, redness, odor or green/yellow discharge around incision site)   Complete by:  As directed    Call MD for:  severe uncontrolled pain   Complete by:  As directed    Call MD for:  temperature >100.4   Complete by:  As directed    Diet - low sodium  heart healthy   Complete by:  As directed    Discharge instructions   Complete by:  As directed    Call [phone] for a followup appointment. Take a stool softener while you are using pain medications.   Driving Restrictions   Complete by:  As directed    Do not drive for 2 weeks.   Increase activity slowly   Complete by:  As directed    Lifting restrictions   Complete by:  As directed    Do not lift more than 5 pounds. No excessive bending or twisting.   May shower / Bathe   Complete by:  As directed    Remove the dressing for 3 days after surgery.  You may shower, but leave the incision alone.   Remove dressing in 48 hours   Complete by:  As directed    Your stitches are under the scan and will dissolve by themselves. The Steri-Strips will fall off after you take a few showers. Do not rub back or pick at the wound, Leave the wound alone.     Allergies as of 12/19/2017   No Known Allergies     Medication List    TAKE these medications   amLODipine 5 MG tablet Commonly known as:  NORVASC TAKE 1 TABLET ONCE DAILY.   atorvastatin 40 MG tablet Commonly known as:  LIPITOR TAKE 1 TABLET BY MOUTH DAILY.   carvedilol 6.25 MG tablet Commonly known as:  COREG Take 1 tablet (6.25 mg total) by mouth 2 (two) times  daily.   Co-Enzyme Q-10 100 MG Caps Take 100 mg by mouth daily.   cyclobenzaprine 10 MG tablet Commonly known as:  FLEXERIL Take 1 tablet (10 mg total) by mouth 3 (three) times daily as needed for muscle spasms.   docusate sodium 100 MG capsule Commonly known as:  COLACE Take 1 capsule (100 mg total) by mouth 2 (two) times daily.   doxazosin 8 MG tablet Commonly known as:  CARDURA Take 8 mg by mouth at bedtime.   ELIQUIS 5 MG Tabs tablet Generic drug:  apixaban TAKE 1 TABLET BY MOUTH TWICE DAILY.   fexofenadine 180 MG tablet Commonly known as:  ALLEGRA Take 180 mg by mouth daily as needed for allergies.   FISH OIL PO Take 1 capsule by mouth daily.    multivitamin-iron-minerals-folic acid chewable tablet Chew 1 tablet by mouth daily.   olmesartan-hydrochlorothiazide 40-12.5 MG tablet Commonly known as:  BENICAR HCT Take 1 tablet by mouth daily.   omeprazole 40 MG capsule Commonly known as:  PRILOSEC Take 40 mg by mouth daily as needed (for acid reflux).   oxyCODONE 5 MG immediate release tablet Commonly known as:  Oxy IR/ROXICODONE Take 1 tablet (5 mg total) by mouth every 4 (four) hours as needed for moderate pain ((score 4 to 6)).   SYNTHROID 100 MCG tablet Generic drug:  levothyroxine Take 100 mcg by mouth daily before breakfast.   traZODone 50 MG tablet Commonly known as:  DESYREL Take 50 mg by mouth at bedtime as needed for sleep.   zolpidem 10 MG tablet Commonly known as:  AMBIEN Take 10 mg by mouth at bedtime as needed for sleep.        Signed: Ophelia Charter 12/19/2017, 5:00 PM

## 2017-12-19 NOTE — Progress Notes (Signed)
Subjective: The patient is alert and pleasant.  He is in no apparent distress.  He looks well.  .  Objective: Vital signs in last 24 hours: Temp:  [97.2 F (36.2 C)-98.4 F (36.9 C)] 97.9 F (36.6 C) (12/19 0725) Pulse Rate:  [52-80] 70 (12/19 0725) Resp:  [12-20] 16 (12/19 0725) BP: (110-143)/(42-82) 123/82 (12/19 0725) SpO2:  [94 %-100 %] 94 % (12/19 0725) Weight:  [99 kg] 99 kg (12/18 0830) Estimated body mass index is 31.32 kg/m as calculated from the following:   Height as of this encounter: 5\' 10"  (1.778 m).   Weight as of this encounter: 99 kg.   Intake/Output from previous day: 12/18 0701 - 12/19 0700 In: 1325.8 [I.V.:1325.8] Out: 495 [Drains:195; Blood:300] Intake/Output this shift: No intake/output data recorded.  Physical exam The patient is alert and pleasant.  He is moving his lower extremities well.  His dressing is clean and dry  Lab Results: No results for input(s): WBC, HGB, HCT, PLT in the last 72 hours. BMET No results for input(s): NA, K, CL, CO2, GLUCOSE, BUN, CREATININE, CALCIUM in the last 72 hours.  Studies/Results: Dg Lumbar Spine 1 View  Result Date: 12/18/2017 CLINICAL DATA:  Lumbar surgery for L2-L3, L3-L4 and L4-L5 laminectomy and foraminotomy EXAM: LUMBAR SPINE - 1 VIEW COMPARISON:  Portable cross-table lateral view at 1232 hours compared to CT lumbar spine 09/30/2017 FINDINGS: Prior MRI labeled with 5 lumbar vertebra. Current exam labeled accordingly. Metallic probe via dorsal approach projects dorsal to the L3-L4 disc space level. Tissue spreader and surgical sponge are also seen dorsal to L3-L4. Disc space narrowing L4-L5 and L5-S1 as well as L2-L3. IMPRESSION: Dorsal localization of the L3-L4 disc space level. Electronically Signed   By: Lavonia Dana M.D.   On: 12/18/2017 13:16    Assessment/Plan: Postop day #1: The patient is doing well.  We will discontinue his Hemovac.  He may go home later on today.  I gave him his discharge  instructions and answered all his questions.  LOS: 1 day     Ophelia Charter 12/19/2017, 8:07 AM

## 2018-02-13 ENCOUNTER — Other Ambulatory Visit: Payer: Self-pay | Admitting: Cardiovascular Disease

## 2018-06-05 ENCOUNTER — Other Ambulatory Visit: Payer: Self-pay | Admitting: Cardiovascular Disease

## 2018-06-05 DIAGNOSIS — I10 Essential (primary) hypertension: Secondary | ICD-10-CM

## 2018-06-05 DIAGNOSIS — I2510 Atherosclerotic heart disease of native coronary artery without angina pectoris: Secondary | ICD-10-CM

## 2018-06-12 ENCOUNTER — Encounter: Payer: Self-pay | Admitting: Nurse Practitioner

## 2018-06-12 ENCOUNTER — Telehealth: Payer: Self-pay | Admitting: Cardiovascular Disease

## 2018-06-12 NOTE — Progress Notes (Signed)
Cardiology Office Note:    Date:  06/13/2018   ID:  Logan Sanders, DOB 1945-09-19, MRN [id number]  PCP:  Logan Pillar, MD  Cardiologist:  Logan Moores, MD   Electrophysiologist:  None   Referring MD: Logan Pillar, MD   Chief Complaint  Patient presents with   Palpitations     History of Present Illness:    Logan Sanders is a 73 y.o. male with:  Paroxysmal AFib  CHADS2-VASc=3 (HTN, CAD, age x71)   Eliquis  Coronary artery disease   FHx of CAD  Hypertension   Hyperlipidemia   Hypothyroidism  PVCs  Logan Sanders returns for the evaluation palpitations.  He has had a strange feeling in his chest for the past 2 weeks.  He has similar symptoms in the past and was told to reduce his caffeine.  He does drink 2 cups of coffee and maybe 1 iced tea and 1 soft drink per day.  He does not have chest pain, shortness of breath, syncope, paroxysmal nocturnal dyspnea, leg swelling.  He does have dizziness with standing up quickly.  He has not had any bleeding issues.    Prior CV studies:   The following studies were reviewed today:  Echo 12/16/17 Mod conc LVH, EF 50-55, no RWMA, Gr 1 DD, mild MR, normal RVSF  Cardiac Catheterization 11/09/13 LM mid 20-30 LAD prox irregs, mid 30-40; D1 ost 30-40 LCx mid 40; OM1 50 RCA mid-dist 20-30 EF 55  Myoview 11/02/13 Mild lateral ischemia, EF 47; Intermediate Risk  Ca Score 10/27/13 IMPRESSION: Coronary calcium score of 77. This was 46th percentile for age and sex matched control.  Past Medical History:  Diagnosis Date   BPH (benign prostatic hyperplasia)    CAD in native artery    Complication of anesthesia    gets really anxious when waking up   ED (erectile dysfunction)    Hyperlipidemia    Hypertension    Hypothyroidism    Osteoarthritis    Thyroid disease    Surgical Hx: The patient  has a past surgical history that includes Replacement total knee (2005); Neck surgery (1999); LOWER BACK SURGERY  (1985); Cardiac catheterization (11/09/13); left heart catheterization with coronary angiogram (N/A, 11/09/2013); Knee arthroscopy (Left); Colonoscopy; Wisdom tooth extraction; and Lumbar laminectomy/decompression microdiscectomy (N/A, 12/18/2017).   Current Medications: Current Meds  Medication Sig   amLODipine (NORVASC) 5 MG tablet Take 5 mg by mouth daily.   atorvastatin (LIPITOR) 40 MG tablet TAKE 1 TABLET BY MOUTH DAILY.   carvedilol (COREG) 6.25 MG tablet Take 1.5 tablets (9.375 mg total) by mouth daily. Take 9.375 mg in the am 6.25 mg in the pm   Co-Enzyme Q-10 100 MG CAPS Take 100 mg by mouth daily.    docusate sodium (COLACE) 100 MG capsule Take 100 mg by mouth 2 (two) times daily as needed for mild constipation.   doxazosin (CARDURA) 8 MG tablet Take 8 mg by mouth at bedtime.    ELIQUIS 5 MG TABS tablet TAKE 1 TABLET BY MOUTH TWICE DAILY.   fexofenadine (ALLEGRA) 180 MG tablet Take 180 mg by mouth daily as needed for allergies.    hydrochlorothiazide (HYDRODIURIL) 12.5 MG tablet Take 12.5 mg by mouth daily.   multivitamin-iron-minerals-folic acid (CENTRUM) chewable tablet Chew 1 tablet by mouth daily.   olmesartan (BENICAR) 40 MG tablet Take 40 mg by mouth daily.   Omega-3 Fatty Acids (FISH OIL PO) Take 1 capsule by mouth daily.   omeprazole (PRILOSEC) 40 MG capsule Take  40 mg by mouth daily as needed (for acid reflux).   SYNTHROID 100 MCG tablet Take 100 mcg by mouth daily before breakfast.    traZODone (DESYREL) 50 MG tablet Take 50 mg by mouth at bedtime as needed for sleep.    zolpidem (AMBIEN) 10 MG tablet Take 10 mg by mouth at bedtime as needed for sleep.    [DISCONTINUED] carvedilol (COREG) 6.25 MG tablet Take 1 tablet (6.25 mg total) by mouth 2 (two) times daily.     Allergies:   Patient has no known allergies.   Social History   Tobacco Use   Smoking status: Former Smoker    Quit date: 05/06/1999    Years since quitting: 19.1   Smokeless tobacco:  Former Systems developer    Quit date: 07/28/1995  Substance Use Topics   Alcohol use: Yes    Alcohol/week: 1.0 standard drinks    Types: 1 Cans of beer per week    Comment: occasionally   Drug use: No     Family Hx: The patient's family history includes Asthma in his mother; Heart attack in his father; Heart disease in his father; Hyperlipidemia in his brother; Parkinsonism in his mother; Prostate cancer in his father.  ROS:   Please see the history of present illness.    ROS All other systems reviewed and are negative.   EKGs/Labs/Other Test Reviewed:    EKG:  EKG is  ordered today.  The ekg ordered today demonstrates normal sinus rhythm, HR 61, LAD, PVCs, QTc 440  Recent Labs: 07/19/2017: ALT 49 12/09/2017: BUN 23; Creatinine, Ser 1.47; Hemoglobin 14.2; Platelets 229; Potassium 4.5; Sodium 137   Recent Lipid Panel Lab Results  Component Value Date/Time   CHOL 113 07/19/2017 07:55 AM   TRIG 68 07/19/2017 07:55 AM   HDL 38 (L) 07/19/2017 07:55 AM   CHOLHDL 3.0 07/19/2017 07:55 AM   CHOLHDL 2.6 07/28/2015 10:42 AM   LDLCALC 61 07/19/2017 07:55 AM    Physical Exam:    VS:  BP 140/78    Pulse 61    Ht 5\' 10"  (1.778 m)    Wt 220 lb 12.8 oz (100.2 kg)    SpO2 96%    BMI 31.68 kg/m     Wt Readings from Last 3 Encounters:  06/13/18 220 lb 12.8 oz (100.2 kg)  12/18/17 218 lb 4.1 oz (99 kg)  12/09/17 223 lb 4.8 oz (101.3 kg)     Physical Exam  Constitutional: He is oriented to person, place, and time. He appears well-developed and well-nourished. No distress.  HENT:  Head: Normocephalic and atraumatic.  Eyes: No scleral icterus.  Neck: No JVD present. No thyromegaly present.  Cardiovascular: Normal rate and regular rhythm.  No murmur heard. Pulmonary/Chest: Effort normal. He has no rales.  Abdominal: Soft.  Musculoskeletal:        General: No edema.  Lymphadenopathy:    He has no cervical adenopathy.  Neurological: He is alert and oriented to person, place, and time.  Skin:  Skin is warm and dry.  Psychiatric: He has a normal mood and affect.    ASSESSMENT & PLAN:    PVC's (premature ventricular contractions) - Plan:   He has a lot of PVCs on his ECG.  He had this at his last visit in Dec 2020.  Dr. Acie Fredrickson had him get an echocardiogram and this demonstrated normal EF.  He does drink a lot of caffeine.  -Labs: BMET, Mg2+, CBC, TSH  -Zio 3-14 day monitor to  assess for recurrent AFib and assess PVC burden   -Reduce caffeine  -Increase Coreg to 9.375 mg in A and 6.25 mg in P  Coronary artery disease involving native coronary artery of native heart without angina pectoris - Plan:   Non-obs CAD by cath in 2015.  He is not having angina.  He is not on ASA as he is on Apixaban.  Continue statin.   PAF (paroxysmal atrial fibrillation) (HCC) -   Maintaining normal sinus rhythm.  Continue Apixaban.  Obtain BMET, CBC.  Obtain Zio 3-14 day to assess for AFib and PVC burden.   Essential hypertension - Plan:   BP above target.  Adjust Carvedilol as noted.    Dispo:  Return in about 6 weeks (around 07/25/2018) for Follow up after testing, w/ Dr. Acie Fredrickson, or Richardson Dopp, PA-C.   Medication Adjustments/Labs and Tests Ordered: Current medicines are reviewed at length with the patient today.  Concerns regarding medicines are outlined above.  Tests Ordered: Orders Placed This Encounter  Procedures   Basic metabolic panel   Magnesium   CBC   TSH   LONG TERM MONITOR (3-14 DAYS)   EKG 12-Lead   Medication Changes: Meds ordered this encounter  Medications   carvedilol (COREG) 6.25 MG tablet    Sig: Take 1.5 tablets (9.375 mg total) by mouth daily. Take 9.375 mg in the am 6.25 mg in the pm    Dispense:  180 tablet    Refill:  3    Signed, Richardson Dopp, PA-C  06/13/2018 1:07 PM    [facility] [place], [place], [place]  [address] Phone: [phone]; Fax: [phone]

## 2018-06-12 NOTE — Telephone Encounter (Signed)
  Logan Sanders is c/o possible Afib today. He says he has a flutter in his chest and little lightheadedness. No other symptoms noted Please advise.

## 2018-06-12 NOTE — Telephone Encounter (Signed)
Spoke with patient who states he is feeling like he is having more a fib. Has a [employer]ardia monitor which noted a fib. Does not have BP reading but states HR is 72. Complains of worsening fatigue, occasional fluttering or palpitations; denies chest pain. I advised that we can schedule an office visit or if he can send in strips from [place] monitor, Dr. Acie Fredrickson can advise via telephone. Patient states he would feel better coming in the office to get an ekg. Appointment with Richardson Dopp, PA made for 6/12. Patient aware of Covid restrictions at our office and thanked me for the call.        COVID-19 Pre-Screening Questions:  . In the past 7 to 10 days have you had a cough,  shortness of breath, headache, congestion, fever (100 or greater) body aches, chills, sore throat, or sudden loss of taste or sense of smell? No . Have you been around anyone with known Covid 19. No . Have you been around anyone who is awaiting Covid 19 test results in the past 7 to 10 days? No . Have you been around anyone who has been exposed to Covid 19, or has mentioned symptoms of Covid 19 within the past 7 to 10 days? No  If you have any concerns/questions about symptoms patients report during screening (either on the phone or at threshold). Contact the provider seeing the patient or DOD for further guidance.  If neither are available contact a member of the leadership team.

## 2018-06-13 ENCOUNTER — Ambulatory Visit (INDEPENDENT_AMBULATORY_CARE_PROVIDER_SITE_OTHER): Payer: Medicare Other | Admitting: Physician Assistant

## 2018-06-13 ENCOUNTER — Other Ambulatory Visit: Payer: Self-pay

## 2018-06-13 ENCOUNTER — Encounter: Payer: Self-pay | Admitting: Physician Assistant

## 2018-06-13 VITALS — BP 140/78 | HR 61 | Ht 70.0 in | Wt 220.8 lb

## 2018-06-13 DIAGNOSIS — I2510 Atherosclerotic heart disease of native coronary artery without angina pectoris: Secondary | ICD-10-CM

## 2018-06-13 DIAGNOSIS — I493 Ventricular premature depolarization: Secondary | ICD-10-CM

## 2018-06-13 DIAGNOSIS — R002 Palpitations: Secondary | ICD-10-CM | POA: Diagnosis not present

## 2018-06-13 DIAGNOSIS — I10 Essential (primary) hypertension: Secondary | ICD-10-CM

## 2018-06-13 DIAGNOSIS — I480 Paroxysmal atrial fibrillation: Secondary | ICD-10-CM

## 2018-06-13 MED ORDER — CARVEDILOL 6.25 MG PO TABS: 9.3750 mg | ORAL_TABLET | Freq: Every day | ORAL | 3 refills | Status: DC

## 2018-06-13 NOTE — Patient Instructions (Addendum)
Medication Instructions:   Start  Taking Coreg: 9.375 mg in the am  6.25 mg in the pm  If you need a refill on your cardiac medications before your next appointment, please call your pharmacy.   Lab work:  BMET MAG CBC TSH  Today   If you have labs (blood work) drawn today and your tests are completely normal, you will receive your results only by: Marland Kitchen MyChart Message (if you have MyChart) OR . A paper copy in the mail If you have any lab test that is abnormal or we need to change your treatment, we will call you to review the results.  Testing/Procedures:DEPARTMENT CONTACTED WILL NOTIFY PATIENT.  Your physician has recommended that you wear an event monitor. Event monitors are medical devices that record the heart's electrical activity. Doctors most often [place] these monitors to diagnose arrhythmias. Arrhythmias are problems with the speed or rhythm of the heartbeat. The monitor is a small, portable device. You can wear one while you do your normal daily activities. This is usually used to diagnose what is causing palpitations/syncope (passing out).  Follow-Up: At [facility], you and your health needs are our priority.  As part of our continuing mission to provide you with exceptional heart care, we have created designated Provider Care Teams.  These Care Teams include your primary Cardiologist (physician) and Advanced Practice Providers (APPs -  Physician Assistants and Nurse Practitioners) who all work together to provide you with the care you need, when you need it. You will need a follow up appointment in:  4-6 weeks. Virtual visit  (2103)  You may see Mertie Moores, MD or one of the following Advanced Practice Providers on your designated Care Team: Richardson Dopp, PA-C [place], [place] . Daune Perch, NP  Any Other Special Instructions Will Be Listed Below (If Applicable).

## 2018-06-14 LAB — BASIC METABOLIC PANEL
BUN/Creatinine Ratio: 11 (ref 10–24)
BUN: 15 mg/dL (ref 8–27)
CO2: 23 mmol/L (ref 20–29)
Calcium: 9.7 mg/dL (ref 8.6–10.2)
Chloride: 104 mmol/L (ref 96–106)
Creatinine, Ser: 1.33 mg/dL — ABNORMAL HIGH (ref 0.76–1.27)
GFR calc Af Amer: 62 mL/min/{1.73_m2} (ref >59)
GFR calc non Af Amer: 53 mL/min/{1.73_m2} — ABNORMAL LOW (ref >59)
Glucose: 95 mg/dL (ref 65–99)
Potassium: 3.9 mmol/L (ref 3.5–5.2)
Sodium: 141 mmol/L (ref 134–144)

## 2018-06-14 LAB — TSH: TSH: 1.84 u[IU]/mL (ref 0.450–4.500)

## 2018-06-14 LAB — CBC
Hematocrit: 45 % (ref 37.5–51.0)
Hemoglobin: 15.3 g/dL (ref 13.0–17.7)
MCH: 30.1 pg (ref 26.6–33.0)
MCHC: 34 g/dL (ref 31.5–35.7)
MCV: 88 fL (ref 79–97)
Platelets: 227 10*3/uL (ref 150–450)
RBC: 5.09 x10E6/uL (ref 4.14–5.80)
RDW: 13.4 % (ref 11.6–15.4)
WBC: 6.5 10*3/uL (ref 3.4–10.8)

## 2018-06-14 LAB — MAGNESIUM: Magnesium: 2.1 mg/dL (ref 1.6–2.3)

## 2018-06-16 ENCOUNTER — Telehealth: Payer: Self-pay | Admitting: Radiology

## 2018-06-16 NOTE — Telephone Encounter (Signed)
Tried calling to verify address/insurance to have his monitor mailed. Also briefly need to go over monitor instructions

## 2018-06-16 NOTE — Telephone Encounter (Signed)
Enrolled patient for a 3 Day Zio monitor to be mailed. Brief instructions were gone over with the patient and he knows to expect the monitor to arrive in 3-4 days

## 2018-06-20 ENCOUNTER — Ambulatory Visit (INDEPENDENT_AMBULATORY_CARE_PROVIDER_SITE_OTHER): Payer: Medicare Other

## 2018-06-20 DIAGNOSIS — I480 Paroxysmal atrial fibrillation: Secondary | ICD-10-CM | POA: Diagnosis not present

## 2018-07-02 ENCOUNTER — Other Ambulatory Visit: Payer: Self-pay

## 2018-07-03 ENCOUNTER — Encounter: Payer: Self-pay | Admitting: Physician Assistant

## 2018-07-03 DIAGNOSIS — I493 Ventricular premature depolarization: Secondary | ICD-10-CM | POA: Insufficient documentation

## 2018-07-07 ENCOUNTER — Other Ambulatory Visit: Payer: Self-pay | Admitting: *Deleted

## 2018-07-07 ENCOUNTER — Encounter: Payer: Self-pay | Admitting: *Deleted

## 2018-07-07 DIAGNOSIS — I2510 Atherosclerotic heart disease of native coronary artery without angina pectoris: Secondary | ICD-10-CM

## 2018-07-07 DIAGNOSIS — I493 Ventricular premature depolarization: Secondary | ICD-10-CM

## 2018-07-08 ENCOUNTER — Telehealth: Payer: Self-pay | Admitting: Cardiology

## 2018-07-08 NOTE — Telephone Encounter (Signed)
New message   Pt is scheduled per referral for appt with Dr. Curt Bears on 07.09.20. Pt has a smart phone and will do video visit, smart phone number is listed in appt notes.        Virtual Visit Pre-Appointment Phone Call  "(Name), I am calling you today to discuss your upcoming appointment. We are currently trying to limit exposure to the virus that causes COVID-19 by seeing patients at home rather than in the office."  1. "What is the BEST phone number to call the day of the visit?" - include this in appointment notes  2. Do you have or have access to (through a family member/friend) a smartphone with video capability that we can use for your visit?" a. If yes - list this number in appt notes as cell (if different from BEST phone #) and list the appointment type as a VIDEO visit in appointment notes b. If no - list the appointment type as a PHONE visit in appointment notes  3. Confirm consent - "In the setting of the current Covid19 crisis, you are scheduled for a (phone or video) visit with your provider on (date) at (time).  Just as we do with many in-office visits, in order for you to participate in this visit, we must obtain consent.  If you'd like, I can send this to your mychart (if signed up) or email for you to review.  Otherwise, I can obtain your verbal consent now.  All virtual visits are billed to your insurance company just like a normal visit would be.  By agreeing to a virtual visit, we'd like you to understand that the technology does not allow for your provider to perform an examination, and thus may limit your provider's ability to fully assess your condition. If your provider identifies any concerns that need to be evaluated in person, we will make arrangements to do so.  Finally, though the technology is pretty good, we cannot assure that it will always work on either your or our end, and in the setting of a video visit, we may have to convert it to a phone-only visit.  In  either situation, we cannot ensure that we have a secure connection.  Are you willing to proceed?" STAFF: Did the patient verbally acknowledge consent to telehealth visit? Document YES/NO here: YES  4. Advise patient to be prepared - "Two hours prior to your appointment, go ahead and check your blood pressure, pulse, oxygen saturation, and your weight (if you have the equipment to check those) and write them all down. When your visit starts, your provider will ask you for this information. If you have an Apple Watch or Kardia device, please plan to have heart rate information ready on the day of your appointment. Please have a pen and paper handy nearby the day of the visit as well."  5. Give patient instructions for MyChart download to smartphone OR Doximity/Doxy.me as below if video visit (depending on what platform provider is using)  6. Inform patient they will receive a phone call 15 minutes prior to their appointment time (may be from unknown caller ID) so they should be prepared to answer    Logan Sanders has been deemed a candidate for a follow-up tele-health visit to limit community exposure during the Covid-19 pandemic. I spoke with the patient via phone to ensure availability of phone/video source, confirm preferred email & phone number, and discuss instructions and expectations.  I reminded Logan Sanders to be prepared with any vital sign and/or heart rhythm information that could potentially be obtained via home monitoring, at the time of his visit. I reminded Logan Sanders to expect a phone call prior to his visit.  Logan Sanders 07/08/2018 3:53 PM   INSTRUCTIONS FOR DOWNLOADING THE MYCHART APP TO SMARTPHONE  - The patient must first make sure to have activated MyChart and know their login information - If Apple, go to [employer] and type in MyChart in the search bar and download the app. If Android, ask patient to go to [employer] and type in  [place] in the search bar and download the app. The app is free but as with any other app downloads, their phone may require them to verify saved payment information or Apple/Android password.  - The patient will need to then log into the app with their MyChart username and password, and select [facility] as their healthcare provider to link the account. When it is time for your visit, go to the MyChart app, find appointments, and click Begin Video Visit. Be sure to Select Allow for your device to access the Microphone and Camera for your visit. You will then be connected, and your provider will be with you shortly.  **If they have any issues connecting, or need assistance please contact MyChart service desk [phone])**  **If using a computer, in order to ensure the best quality for their visit they will need to use either of the following Internet Browsers: [employer], or Google Chrome**  IF USING DOXIMITY or DOXY.ME - The patient will receive a link just prior to their visit by text.     FULL LENGTH CONSENT FOR TELE-HEALTH VISIT   I hereby voluntarily request, consent and authorize [facility] and its employed or contracted physicians, physician assistants, nurse practitioners or other licensed health care professionals (the Practitioner), to provide me with telemedicine health care services (the Services") as deemed necessary by the treating Practitioner. I acknowledge and consent to receive the Services by the Practitioner via telemedicine. I understand that the telemedicine visit will involve communicating with the Practitioner through live audiovisual communication technology and the disclosure of certain medical information by electronic transmission. I acknowledge that I have been given the opportunity to request an in-person assessment or other available alternative prior to the telemedicine visit and am voluntarily participating in the telemedicine visit.  I  understand that I have the right to withhold or withdraw my consent to the use of telemedicine in the course of my care at any time, without affecting my right to future care or treatment, and that the Practitioner or I may terminate the telemedicine visit at any time. I understand that I have the right to inspect all information obtained and/or recorded in the course of the telemedicine visit and may receive copies of available information for a reasonable fee.  I understand that some of the potential risks of receiving the Services via telemedicine include:   Delay or interruption in medical evaluation due to technological equipment failure or disruption;  Information transmitted may not be sufficient (e.g. poor resolution of images) to allow for appropriate medical decision making by the Practitioner; and/or   In rare instances, security protocols could fail, causing a breach of personal health information.  Furthermore, I acknowledge that it is my responsibility to provide information about my medical history, conditions and care that is complete and accurate to the best of my ability. I  acknowledge that Practitioner's advice, recommendations, and/or decision may be based on factors not within their control, such as incomplete or inaccurate data provided by me or distortions of diagnostic images or specimens that may result from electronic transmissions. I understand that the practice of medicine is not an exact science and that Practitioner makes no warranties or guarantees regarding treatment outcomes. I acknowledge that I will receive a copy of this consent concurrently upon execution via email to the email address I last provided but may also request a printed copy by calling the office of [facility].    I understand that my insurance will be billed for this visit.   I have read or had this consent read to me.  I understand the contents of this consent, which adequately explains the  benefits and risks of the Services being provided via telemedicine.   I have been provided ample opportunity to ask questions regarding this consent and the Services and have had my questions answered to my satisfaction.  I give my informed consent for the services to be provided through the use of telemedicine in my medical care  By participating in this telemedicine visit I agree to the above.

## 2018-07-10 ENCOUNTER — Telehealth (INDEPENDENT_AMBULATORY_CARE_PROVIDER_SITE_OTHER): Payer: Medicare Other | Admitting: Cardiology

## 2018-07-10 ENCOUNTER — Telehealth: Payer: Self-pay | Admitting: Cardiology

## 2018-07-10 ENCOUNTER — Other Ambulatory Visit: Payer: Self-pay | Admitting: Cardiovascular Disease

## 2018-07-10 ENCOUNTER — Other Ambulatory Visit: Payer: Self-pay

## 2018-07-10 DIAGNOSIS — I2510 Atherosclerotic heart disease of native coronary artery without angina pectoris: Secondary | ICD-10-CM

## 2018-07-10 DIAGNOSIS — I493 Ventricular premature depolarization: Secondary | ICD-10-CM

## 2018-07-10 DIAGNOSIS — I10 Essential (primary) hypertension: Secondary | ICD-10-CM

## 2018-07-10 MED ORDER — MEXILETINE HCL 150 MG PO CAPS: 150.0000 mg | ORAL_CAPSULE | Freq: Two times a day (BID) | ORAL | 3 refills | Status: DC

## 2018-07-10 NOTE — Telephone Encounter (Signed)
RN from [facility] calling to report pts vitals from a nurse visit today.  BP elevated at 164/62 with a later reading of 156/60.  HR was 37bpm, no symptoms.  I advised [facility] RN I would forward to Dr. Curt Bears to review for upcoming E Visit today.

## 2018-07-10 NOTE — Telephone Encounter (Signed)
Please review for refill.  

## 2018-07-10 NOTE — Progress Notes (Signed)
Virtual Visit via Video Note   This visit type was conducted due to national recommendations for restrictions regarding the COVID-19 Pandemic (e.g. social distancing) in an effort to limit this patient's exposure and mitigate transmission in our community.  Due to his co-morbid illnesses, this patient is at least at moderate risk for complications without adequate follow up.  This format is felt to be most appropriate for this patient at this time.  All issues noted in this document were discussed and addressed.  A limited physical exam was performed with this format.  Please refer to the patient's chart for his consent to telehealth for [facility].   Date:  07/10/2018   ID:  Logan Sanders, DOB 16-Jan-1945, MRN [id number]  Patient Location: Home Provider Location: Home  PCP:  Kelton Pillar, MD  Cardiologist:  Mertie Moores, MD  Electrophysiologist:  None   Evaluation Performed:  Consultation - Logan Sanders was referred by Richardson Dopp for the evaluation of PVCs.  Chief Complaint: PVCs  History of Present Illness:    Logan Sanders is a 73 y.o. male with a history of paroxysmal atrial fibrillation on Eliquis, coronary artery disease, hypertension, hyperlipidemia, hypothyroidism, and PVCs.  He has had palpitations over the last few weeks.  He drinks 1 to 2 cups of coffee as well as 2 other caffeinated drinks a day.  No chest pain or shortness of breath.  No syncope.  No heart failure symptoms.  Today, denies symptoms of palpitations, chest pain, shortness of breath, orthopnea, PND, lower extremity edema, claudication, dizziness, presyncope, syncope, bleeding, or neurologic sequela. The patient is tolerating medications without difficulties.  His main symptom is due to his PVCs are of weakness and fatigue.  He initially thought that his weakness and fatigue was due to being at home and not being as active around the coronavirus pandemic.  He does not have much in the way of  palpitations or shortness of breath.   The patient does not have symptoms concerning for COVID-19 infection (fever, chills, cough, or new shortness of breath).    Past Medical History:  Diagnosis Date   BPH (benign prostatic hyperplasia)    CAD in native artery    Complication of anesthesia    gets really anxious when waking up   ED (erectile dysfunction)    Hyperlipidemia    Hypertension    Hypothyroidism    Osteoarthritis    PVC's (premature ventricular contractions)    3-day cardiac monitor 07/2018: NSR, frequent PVCs (PVC burden 15.9%); rare PACs and rare episode of non-sustained SVT   Thyroid disease    Past Surgical History:  Procedure Laterality Date   CARDIAC CATHETERIZATION  11/09/13   non obstructive disease in all vessels.   COLONOSCOPY     KNEE ARTHROSCOPY Left    x3   LEFT HEART CATHETERIZATION WITH CORONARY ANGIOGRAM N/A 11/09/2013   Procedure: LEFT HEART CATHETERIZATION WITH CORONARY ANGIOGRAM;  Surgeon: Blane Ohara, MD;  Location: [facility] CATH LAB;  Service: Cardiovascular;  Laterality: N/A;   LOWER BACK SURGERY  1985   LUMBAR LAMINECTOMY/DECOMPRESSION MICRODISCECTOMY N/A 12/18/2017   Procedure: LAMINECTOMY AND FORAMINOTOMY LUMBAR TWO- LUMBAR THREE, LUMBAR THREE- LUMBAR FOUR, LUMBAR FOUR- LUMBAR FIVE;  Surgeon: Newman Pies, MD;  Location: [facility];  Service: Neurosurgery;  Laterality: N/A;   NECK SURGERY  1999   REPLACEMENT TOTAL KNEE  2005   LEFT KNEE   WISDOM TOOTH EXTRACTION       Current Meds  Medication Sig  amLODipine (NORVASC) 5 MG tablet Take 5 mg by mouth daily.   atorvastatin (LIPITOR) 40 MG tablet TAKE 1 TABLET BY MOUTH DAILY.   carvedilol (COREG) 6.25 MG tablet Take 1.5 tablets (9.375 mg total) by mouth daily. Take 9.375 mg in the am 6.25 mg in the pm   Co-Enzyme Q-10 100 MG CAPS Take 100 mg by mouth daily.    docusate sodium (COLACE) 100 MG capsule Take 100 mg by mouth 2 (two) times daily as needed for mild  constipation.   doxazosin (CARDURA) 8 MG tablet Take 8 mg by mouth at bedtime.    ELIQUIS 5 MG TABS tablet TAKE 1 TABLET BY MOUTH TWICE DAILY.   fexofenadine (ALLEGRA) 180 MG tablet Take 180 mg by mouth daily as needed for allergies.    hydrochlorothiazide (HYDRODIURIL) 12.5 MG tablet Take 12.5 mg by mouth daily.   multivitamin-iron-minerals-folic acid (CENTRUM) chewable tablet Chew 1 tablet by mouth daily.   olmesartan (BENICAR) 40 MG tablet Take 40 mg by mouth daily.   Omega-3 Fatty Acids (FISH OIL PO) Take 1 capsule by mouth daily.   omeprazole (PRILOSEC) 40 MG capsule Take 40 mg by mouth daily as needed (for acid reflux).   SYNTHROID 100 MCG tablet Take 100 mcg by mouth daily before breakfast.    traZODone (DESYREL) 50 MG tablet Take 50 mg by mouth at bedtime as needed for sleep.    zolpidem (AMBIEN) 10 MG tablet Take 10 mg by mouth at bedtime as needed for sleep.      Allergies:   Patient has no known allergies.   Social History   Tobacco Use   Smoking status: Former Smoker    Quit date: 05/06/1999    Years since quitting: 19.1   Smokeless tobacco: Former Systems developer    Quit date: 07/28/1995  Substance Use Topics   Alcohol use: Yes    Alcohol/week: 1.0 standard drinks    Types: 1 Cans of beer per week    Comment: occasionally   Drug use: No     Family Hx: The patient's family history includes Asthma in his mother; Heart attack in his father; Heart disease in his father; Hyperlipidemia in his brother; Parkinsonism in his mother; Prostate cancer in his father.  ROS:   Please see the history of present illness.     All other systems reviewed and are negative.   Prior CV studies:   The following studies were reviewed today:  TTE 12/17/2017 - Left ventricle: The cavity size was normal. There was moderate   concentric hypertrophy. Systolic function was normal. The   estimated ejection fraction was in the range of 50% to 55%. Wall   motion was normal; there were no  regional wall motion   abnormalities. Doppler parameters are consistent with abnormal   left ventricular relaxation (grade 1 diastolic dysfunction). - Mitral valve: There was mild regurgitation. - Right ventricle: The cavity size was normal. Wall thickness was   normal. Systolic function was normal. - Pulmonary arteries: Systolic pressure could not be accurately   estimated.  ZIO monitor 07/03/2018 personally reviewed  NSR  Frequent PVCs. His PVC burden is 15.9%.  Rare PACs and rare episodes of nonsustained SVT.   Labs/Other Tests and Data Reviewed:    EKG:  An ECG dated 12/04/2017 was personally reviewed today and demonstrated:  Sinus rhythm, PVCs  Recent Labs: 07/19/2017: ALT 49 06/13/2018: BUN 15; Creatinine, Ser 1.33; Hemoglobin 15.3; Magnesium 2.1; Platelets 227; Potassium 3.9; Sodium 141; TSH 1.840  Recent Lipid Panel Lab Results  Component Value Date/Time   CHOL 113 07/19/2017 07:55 AM   TRIG 68 07/19/2017 07:55 AM   HDL 38 (L) 07/19/2017 07:55 AM   CHOLHDL 3.0 07/19/2017 07:55 AM   CHOLHDL 2.6 07/28/2015 10:42 AM   LDLCALC 61 07/19/2017 07:55 AM    Wt Readings from Last 3 Encounters:  06/13/18 220 lb 12.8 oz (100.2 kg)  12/18/17 218 lb 4.1 oz (99 kg)  12/09/17 223 lb 4.8 oz (101.3 kg)     Objective:    Vital Signs:  BP (!) 156/60    Pulse (!) 37    VITAL SIGNS:  reviewed GEN:  no acute distress EYES:  sclerae anicteric, EOMI - Extraocular Movements Intact RESPIRATORY:  normal respiratory effort, symmetric expansion CARDIOVASCULAR:  no peripheral edema SKIN:  no rash, lesions or ulcers. MUSCULOSKELETAL:  no obvious deformities. NEURO:  alert and oriented x 3, no obvious focal deficit PSYCH:  normal affect  ASSESSMENT & PLAN:    1. PVCs: Certainly has a high burden of PVCs at 15%.  PVCs appear to be coming from the outflow tracts, likely either the posterior RVOT or the right coronary cusp.  At this point, he would prefer to have medical management  compared with ablation.  We Lynisha Osuch thus start him on mexiletine 150 mg twice daily.  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 2. Paroxysmal atrial fibrillation: Currently on Eliquis. This patients CHA2DS2-VASc Score and unadjusted Ischemic Stroke Rate (% per year) is equal to 3.2 % stroke rate/year from a score of 3  Above score calculated as 1 point each if present [CHF, HTN, DM, Vascular=MI/PAD/Aortic Plaque, Age if 65-74, or Male] Above score calculated as 2 points each if present [Age > 75, or Stroke/TIA/TE]  3. Hypertension: Currently on carvedilol.  His blood pressure is elevated today, but has been normal in the past.  This could be due to pseudo-bradycardia.  We Brianna Esson continue to assess. 4. Coronary artery disease: Nonobstructive by cath in 2015.  Currently on aspirin and Eliquis.  Continue statin.   COVID-19 Education: The signs and symptoms of COVID-19 were discussed with the patient and how to seek care for testing (follow up with PCP or arrange E-visit).  The importance of social distancing was discussed today.  Time:   Today, I have spent 10 minutes with the patient with telehealth technology discussing the above problems.     Medication Adjustments/Labs and Tests Ordered: Current medicines are reviewed at length with the patient today.  Concerns regarding medicines are outlined above.   Tests Ordered: No orders of the defined types were placed in this encounter.   Medication Changes: No orders of the defined types were placed in this encounter.   Follow Up:  In Person in 1-2 month(s)  Signed, Helmer Dull [name], MD  07/10/2018 10:47 AM    [place]

## 2018-07-10 NOTE — Telephone Encounter (Signed)
New message   Please call [facility] about the patient's vitals for the virtual visit today.

## 2018-07-10 NOTE — Telephone Encounter (Signed)
Forwarded to [employer]

## 2018-07-10 NOTE — Addendum Note (Signed)
Addended by: Dollene Primrose on: 07/10/2018 10:58 AM   Modules accepted: Orders

## 2018-07-10 NOTE — Telephone Encounter (Signed)
Eliquis 5mg  refill request received; pt is 73 yrs old, wt-100.2kg, Crea-1.33 on 06/13/2018, last seen by Dr. Curt Bears on today via Telemedicine Visit; will send in refill to requested pharmacy.

## 2018-07-10 NOTE — Patient Instructions (Signed)
Called and discussed the following with pt. He agrees with plan and had no additional questions.  Medication Instructions:  Your physician has recommended you make the following change in your medication:   1. Begin Mexilitine, 150mg  capsule, two times per day.   Labwork: None ordered.   Testing/Procedures: None ordered.   Follow-Up: You have a follow up appointment with Dr. Curt Bears on Sept 9 at 9:30am  Any Other Special Instructions Will Be Listed Below (If Applicable).     If you need a refill on your cardiac medications before your next appointment, please call your pharmacy.

## 2018-07-11 ENCOUNTER — Telehealth (HOSPITAL_COMMUNITY): Payer: Self-pay | Admitting: *Deleted

## 2018-07-11 NOTE — Telephone Encounter (Signed)
Patient given detailed instructions per Myocardial Perfusion Study Information Sheet for the test on 07/15/18 at 7:30. Patient notified to arrive 15 minutes early and that it is imperative to arrive on time for appointment to keep from having the test rescheduled.  If you need to cancel or reschedule your appointment, please call the office within 24 hours of your appointment. . Patient verbalized understanding.Logan Sanders

## 2018-07-15 ENCOUNTER — Telehealth: Payer: Self-pay | Admitting: *Deleted

## 2018-07-15 ENCOUNTER — Other Ambulatory Visit: Payer: Self-pay | Admitting: *Deleted

## 2018-07-15 ENCOUNTER — Other Ambulatory Visit: Payer: Self-pay

## 2018-07-15 ENCOUNTER — Ambulatory Visit (HOSPITAL_COMMUNITY): Payer: Medicare Other | Attending: Cardiology

## 2018-07-15 VITALS — Ht 70.0 in | Wt 218.0 lb

## 2018-07-15 DIAGNOSIS — E7849 Other hyperlipidemia: Secondary | ICD-10-CM

## 2018-07-15 DIAGNOSIS — I2510 Atherosclerotic heart disease of native coronary artery without angina pectoris: Secondary | ICD-10-CM | POA: Diagnosis not present

## 2018-07-15 DIAGNOSIS — I493 Ventricular premature depolarization: Secondary | ICD-10-CM

## 2018-07-15 LAB — MYOCARDIAL PERFUSION IMAGING
LV dias vol: 79 mL (ref 62–150)
LV sys vol: 63 mL
Peak HR: 82 {beats}/min
Rest HR: 62 {beats}/min
SDS: 1
SRS: 0
SSS: 1
TID: 1

## 2018-07-15 MED ORDER — REGADENOSON 0.4 MG/5ML IV SOLN
0.4000 mg | Freq: Once | INTRAVENOUS | Status: AC
  Administered 2018-07-15: 0.4 mg via INTRAVENOUS

## 2018-07-15 MED ORDER — TECHNETIUM TC 99M TETROFOSMIN IV KIT
31.7000 | PACK | Freq: Once | INTRAVENOUS | Status: AC | PRN
  Administered 2018-07-15: 31.7 via INTRAVENOUS
  Filled 2018-07-15: qty 32

## 2018-07-15 MED ORDER — TECHNETIUM TC 99M TETROFOSMIN IV KIT
10.1000 | PACK | Freq: Once | INTRAVENOUS | Status: AC | PRN
  Administered 2018-07-15: 10.1 via INTRAVENOUS
  Filled 2018-07-15: qty 11

## 2018-07-15 NOTE — Addendum Note (Signed)
Addended by: Claude Manges on: 07/15/2018 04:52 PM   Modules accepted: Orders

## 2018-07-15 NOTE — Telephone Encounter (Signed)
Lvm to call back about results and recommended echo

## 2018-07-16 ENCOUNTER — Telehealth: Payer: Self-pay | Admitting: Cardiovascular Disease

## 2018-07-16 NOTE — Telephone Encounter (Signed)

## 2018-07-18 ENCOUNTER — Encounter: Payer: Self-pay | Admitting: Cardiovascular Disease

## 2018-07-18 ENCOUNTER — Ambulatory Visit (INDEPENDENT_AMBULATORY_CARE_PROVIDER_SITE_OTHER): Payer: Medicare Other | Admitting: Cardiovascular Disease

## 2018-07-18 ENCOUNTER — Other Ambulatory Visit: Payer: Self-pay

## 2018-07-18 VITALS — BP 140/68 | HR 41 | Ht 70.0 in | Wt 218.0 lb

## 2018-07-18 DIAGNOSIS — I10 Essential (primary) hypertension: Secondary | ICD-10-CM

## 2018-07-18 DIAGNOSIS — I2510 Atherosclerotic heart disease of native coronary artery without angina pectoris: Secondary | ICD-10-CM

## 2018-07-18 DIAGNOSIS — I498 Other specified cardiac arrhythmias: Secondary | ICD-10-CM

## 2018-07-18 DIAGNOSIS — I5022 Chronic systolic (congestive) heart failure: Secondary | ICD-10-CM | POA: Diagnosis not present

## 2018-07-18 DIAGNOSIS — I493 Ventricular premature depolarization: Secondary | ICD-10-CM

## 2018-07-18 DIAGNOSIS — I499 Cardiac arrhythmia, unspecified: Secondary | ICD-10-CM

## 2018-07-18 DIAGNOSIS — I480 Paroxysmal atrial fibrillation: Secondary | ICD-10-CM | POA: Diagnosis not present

## 2018-07-18 NOTE — Patient Instructions (Signed)
Medication Instructions:  Your physician recommends that you continue on your current medications as directed. Please refer to the Current Medication list given to you today.  Labwork: None ordered.  Testing/Procedures: None ordered.  Follow-Up: Your physician recommends that you schedule a follow-up appointment in:   3 months with Dr. Acie Fredrickson  Any Other Special Instructions Will Be Listed Below (If Applicable).     If you need a refill on your cardiac medications before your next appointment, please call your pharmacy.

## 2018-07-18 NOTE — Progress Notes (Signed)
Logan Sanders Date of Birth  Jun 17, 1945       [name] [address], [address], [place], [place]  [address]   [place], [place]  [address] [place]   Fax  [phone]     Fax [phone]  Problem List: 1. Hypertension 2. Hyperlipidemia 3. Hypothyroidism 4. CAD -   Moderate CAD by cath 11/09/2013 5. Paroxsymal atrial fib:     Logan Sanders is a 73 old gentleman with a history of hypertension, hyperlipidemia and a positive family history of cardiac disease. He was referred for possible stress testing given his risk factors.  He has not had any symptoms of CP or dyspnea.  He worked for the [employer] for 33 years - retired 2005.  Currently, he spends his time riding his [place] , plays golf, hunts deer, [place], squirrel, .  Walks about a mile each day.  Does all this without any CP or dyspnea.  Lifts light weights and uses resistance bands.    Grandfather died at age 6, father died at age 64. Uncle died in his 92's   11/13/2013:  Kerrion presents today for followup. We performed a coronary calcium score which revealed significant calcification: Dense calcification distal LM Scattered calcification of mid and distal LAD and mid and distal RCA  Subsequent stress myoview was abnormal Overall Impression: Intermediate risk stress nuclear study with mild lateral wall ischemia and mildly depressed LV systolic function.  LV Ejection Fraction: 47%. LV Wall Motion: NL LV Function; NL Wall Motion  Jan. 13, 2016: Logan Sanders is a 73 yo with an abnormal coronary calcium score - significant calcification: Dense calcification distal LM Scattered calcification of mid and distal LAD and mid and distal RCA Myoview was abnormal and he was referred for cath.   LM  moderate calcification. The vessel has 20-30% stenosis in its midportion. The ostium of the left main is widely patent. The distal left main is  widely patent.    (LAD): the LAD is medium in caliber. The vessel reaches the LV apex. The proximal vessel has diffuse irregularity with mild calcification.  D1 has 30-40% stenosis at its ostium. The mid LAD at that bifurcation also has 30-40% stenosis. The remaining portions of the LAD have mild nonobstructive disease. Left circumflex (LCx): the left circumflex is patent. The mid vessel has 40% stenosis. There is a tiny obtuse marginal branch that has 50% stenosis. The major OM branch is widely patent with minor nonobstructive disease. Right coronary artery (RCA): this is a dominant vessel. There is diffuse calcification and irregularity but there are no high-grade stenoses present. There is no more than about 20 or 30% stenosis which is most significant at the junction of the mid and distal RCA. The PDA and PLA branches are patent. Left ventriculography: Left ventricular systolic function is normal, LVEF is estimated at 55%  Feeling well.  No CP . The cath showed mild - moderate irregularities. He has not had any further issues  July 19, 2014:   Logan Sanders is doing well.  No dizziness, no syncope  Has some ringing in his ears.  Takes his BP at home, readings are in the normal range.   Jan. 23, 2017: Logan Sanders is seen back for a follow up visit. Walks on occasion .  Not as much exercise as in the summer . No angina . No dyspnea.  BP  is well controlled.   Sept. 6, 2017:  Logan Sanders is seen back for follow up of newly diagnosed atrial fib.  The afib was diagnosed when he came for his echo - was seen incidentally on the echo. Has been started on Eliquis  No bleeding  Still exercising . Has been watching his salt .   March 01, 2016:    Seen back for his mild CAD and Paroxysmal atrial fib  Cannot tell when he is in Atrial fib. Has some indigestion.  Belches and feels better  Not exercising much.  Weather has been challenging  No CP or dyspnea doing his regular activities.   Sept. 27, 2018:    Logan Sanders is seen back for follow-up of his mild to moderate coronary artery disease and his paroxysmal atrial fibrillation. Has had some ankle swelling.   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  Is also on amlodipine ( which is contributing to the edema )   July 19, 2017: Logan Sanders is seen today for follow-up of his mild coronary artery disease, paroxysmal atrial fib .  He pulled a muscle in his back and is requesting some muscle relaxers.  December 04, 2017:  Logan Sanders is seen today for follow-up of his moderate coronary artery disease and paroxysmal atrial fibrillation.  He was recently seen at Dr. Dellis Filbert office.  He was found to have a blood pressure with a a moderately high systolic reading in a very low diastolic reading. Is having frequent PVCs  SPECT that his premature ventricular contractions contributed to his very low diastolic blood pressure reading.  He is fairly active.  He is limited by his hip pain.  Is scheduled to have back surgery on Dec. 18, 2019 hes not having any CP . He is at low risk for this surgery .   Also has had some hemorrhoid issues Needs to have them banded and needs to hold Eliquis for 3 days with that. I've given him the OK for that procedure as well.     July 18, 2018   Is under lots of stress,  duaghter was just dx'd with breast cancer   A myoview was ordered for his increased palpitatinos. And a PVC burden of 15.9%.    Saw Cam[facility]itz for his large PVC burden.   Was stared on Mexilitine 150 mg PO BID .  At times he thinks that the mexiletine has helped with the palpitations but at other times the palpitations seem to be just as bad.   EF was noted to be llow - echo has been ordered to verifty his EF   Echocardiogram performed December, 2019 revealed normal left ventricular systolic function.  He had mild diastolic dysfunction.  Current Outpatient Medications  Medication Sig Dispense Refill  . amLODipine (NORVASC) 5 MG tablet  Take 5 mg by mouth daily.    Marland Kitchen atorvastatin (LIPITOR) 40 MG tablet TAKE 1 TABLET BY MOUTH DAILY. 90 tablet 2  . carvedilol (COREG) 6.25 MG tablet Take 1.5 tablets (9.375 mg total) by mouth daily. Take 9.375 mg in the am 6.25 mg in the pm 180 tablet 3  . Co-Enzyme Q-10 100 MG CAPS Take 100 mg by mouth daily.     Marland Kitchen doxazosin (CARDURA) 8 MG tablet Take 8 mg by mouth at bedtime.     Marland Kitchen ELIQUIS 5 MG TABS tablet TAKE 1 TABLET BY MOUTH TWICE DAILY. 60 tablet 5  . fexofenadine (ALLEGRA) 180 MG tablet Take 180 mg  by mouth daily as needed for allergies.     . hydrochlorothiazide (HYDRODIURIL) 12.5 MG tablet Take 12.5 mg by mouth daily.    Marland Kitchen mexiletine (MEXITIL) 150 MG capsule Take 1 capsule (150 mg total) by mouth 2 (two) times daily. 180 capsule 3  . multivitamin-iron-minerals-folic acid (CENTRUM) chewable tablet Chew 1 tablet by mouth daily.    Marland Kitchen olmesartan (BENICAR) 40 MG tablet Take 40 mg by mouth daily.    . Omega-3 Fatty Acids (FISH OIL PO) Take 1 capsule by mouth daily.    Marland Kitchen omeprazole (PRILOSEC) 40 MG capsule Take 40 mg by mouth daily as needed (for acid reflux).    . SYNTHROID 100 MCG tablet Take 100 mcg by mouth daily before breakfast.     . traZODone (DESYREL) 50 MG tablet Take 50 mg by mouth at bedtime as needed for sleep.     Marland Kitchen zolpidem (AMBIEN) 10 MG tablet Take 10 mg by mouth at bedtime as needed for sleep.      No current facility-administered medications for this visit.      No Known Allergies  Past Medical History:  Diagnosis Date  . BPH (benign prostatic hyperplasia)   . CAD in native artery   . Complication of anesthesia    gets really anxious when waking up  . ED (erectile dysfunction)   . Hyperlipidemia   . Hypertension   . Hypothyroidism   . Osteoarthritis   . PVC's (premature ventricular contractions)    3-day cardiac monitor 07/2018: NSR, frequent PVCs (PVC burden 15.9%); rare PACs and rare episode of non-sustained SVT  . Thyroid disease     Past Surgical History:   Procedure Laterality Date  . CARDIAC CATHETERIZATION  11/09/13   non obstructive disease in all vessels.  . COLONOSCOPY    . KNEE ARTHROSCOPY Left    x3  . LEFT HEART CATHETERIZATION WITH CORONARY ANGIOGRAM N/A 11/09/2013   Procedure: LEFT HEART CATHETERIZATION WITH CORONARY ANGIOGRAM;  Surgeon: Blane Ohara, MD;  Location[facility] C[facility] LAB;  Service: Cardiovascular;  Laterality: N/A;  . LOWER BACK SURGERY  1985  . LUMBAR LAMINECTOMY/DECOMPRESSION MICRODISCECTOMY N/A 12/18/2017   Procedure: LAMINECTOMY AND FORAMINOTOMY LUMBAR TWO- LUMBAR THREE, LUMBAR THREE- LUMBAR FOUR, LUMBAR FOUR- LUMBAR FIVE;  Surgeon: Newman Pies, MD;  Location: [facility];  Service: Neurosurgery;  Laterality: N/A;  . NECK SURGERY  1999  . REPLACEMENT TOTAL KNEE  2005   LEFT KNEE  . WISDOM TOOTH EXTRACTION      Social History   Tobacco Use  Smoking Status Former Smoker  . Quit date: 05/06/1999  . Years since quitting: 19.2  Smokeless Tobacco Former Systems developer  . Quit date: 07/28/1995    Social History   Substance and Sexual Activity  Alcohol Use Yes  . Alcohol/week: 1.0 standard drinks  . Types: 1 Cans of beer per week   Comment: occasionally    Family History  Problem Relation Age of Onset  . Asthma Mother   . Parkinsonism Mother   . Heart disease Father   . Heart attack Father   . Prostate cancer Father   . Hyperlipidemia Brother     Reviw of Systems:  Reviewed in the HPI.  All other systems are negative.  Physical Exam: Blood pressure 140/68, pulse (!) 41, height 5\' 10"  (1.778 m), weight 218 lb (98.9 kg), SpO2 97 %.  GEN:  Well nourished, well developed in no acute distress HEENT: Normal NECK: No JVD; No carotid bruits LYMPHATICS: No lymphadenopathy CARDIAC: RRR  With bigeminy  RESPIRATORY:  Clear to auscultation without rales, wheezing or rhonchi  ABDOMEN: Soft, non-tender, non-distended MUSCULOSKELETAL:  No edema; No deformity  SKIN: Warm and dry NEUROLOGIC:  Alert and oriented x 3    ECG:     July 18, 2018: Normal sinus rhythm with frequent premature ventricular contractions in a pattern of bigeminy.  Assessment / Plan:   1. Hypertension - .  BP is well controlled.   2.  Frequent premature ventricular contractions: He had a PVC burden of 15.9%.  He was started on mexiletine 150 mg twice a day.  He remains on carvedilol 6.25 mg tablets-1/2 tablets twice a day.  We will discuss with Dr. Curt Bears if he has any other ideas since the Mexilitine does not appear to be reducing his PVC burden to any significant degere.   3. CAD -    No angina,  No ischemia by myoview   4.   Paroxysmal atrial fibrillation: He remains in sinus rhythm.   6.  Hyperlipidemia:       Mertie Moores, MD  07/18/2018 9:58 AM    [facility] [place],  [place], [place]  [address] Pager [phone] Phone: [phone]; Fax: [phone]

## 2018-07-22 ENCOUNTER — Telehealth: Payer: Self-pay | Admitting: Cardiovascular Disease

## 2018-07-22 DIAGNOSIS — I493 Ventricular premature depolarization: Secondary | ICD-10-CM

## 2018-07-22 DIAGNOSIS — I5022 Chronic systolic (congestive) heart failure: Secondary | ICD-10-CM

## 2018-07-22 DIAGNOSIS — I480 Paroxysmal atrial fibrillation: Secondary | ICD-10-CM

## 2018-07-22 NOTE — Telephone Encounter (Signed)
Received a call from the patient concerned about his low heart rate. Patient checked his BP this evening and noted HR to 37. Confirmed on Kardia device which noted frequent PVCs. On review of recent clinic note, patient documented as having HR of 41 with similar blood pressure. At this time, patient denied dizziness, lightheadedness, shortness of breath, or chest tightness. He reporting being frustrated that the mexilitene (which was recently started for increasing PVC burden) doesn't seem to be helping and would like to more seriously discuss the possibility of ablation.   I counseled patient to call or present to ER should he develop any of the aforementioned symptoms in the context of his low HR (which is attributable to PVCs). Patient agree and understood the plan.   Will alert Drs. Nahser and [employer] regarding the patient's concerns.   Lauren K. Marletta Lor, MD

## 2018-07-23 MED ORDER — CARVEDILOL 6.25 MG PO TABS: 6.2500 mg | ORAL_TABLET | Freq: Two times a day (BID) | ORAL | 3 refills | Status: DC

## 2018-07-23 MED ORDER — POTASSIUM CHLORIDE ER 10 MEQ PO TBCR: 10.0000 meq | EXTENDED_RELEASE_TABLET | Freq: Every day | ORAL | 11 refills | Status: DC

## 2018-07-23 NOTE — Telephone Encounter (Signed)
Will defer to EP ( Dr. Curt Bears)  on this issue

## 2018-07-23 NOTE — Telephone Encounter (Signed)
Dr. Curt Bears is on vacation this week I saw Angad on July 17.   He was in bigeminy and clear the mexilitine is not helping with the PVCs HR is slow - especially after increasing the coreg  I have talked to Mr. Cletus Gash.  PLan:  Decrease Coreg to 6.25 bid ( his previous dose DC mexilitine Add KCl 10 meq a day ( [facility] pharmacy_ BMP and mag level in 3 weeks  Masson would like to have an appt with Dr Curt Bears to discuss the possibility of PVC ablation He will be available in August.    Mertie Moores, MD  07/23/2018 2:55 PM    [facility] [address],  [place], [place]  [address] Pager [phone] Phone: [phone]; Fax: [phone]

## 2018-07-23 NOTE — Telephone Encounter (Signed)
Patient's medication list was updated and Rx for [place] sent to patient's pharmacy Lab appointment scheduled for 8/3, same day patient comes in for echo Routing to Dr. Curt Bears and primary RN, Sherri for appointment to discuss ablation. Patient currently scheduled for Sept. 9 but may could be seen sooner

## 2018-07-23 NOTE — Addendum Note (Signed)
Addended by: Emmaline Life on: 07/23/2018 04:01 PM   Modules accepted: Orders

## 2018-07-24 ENCOUNTER — Telehealth: Payer: Self-pay

## 2018-07-24 MED ORDER — POTASSIUM CHLORIDE ER 10 MEQ PO TBCR: 10.0000 meq | EXTENDED_RELEASE_TABLET | Freq: Every day | ORAL | 11 refills | Status: DC

## 2018-07-24 NOTE — Telephone Encounter (Signed)
Tried again to send to pharmacy but prescription would not go through.  Called to pharmacy. Pt notified.

## 2018-07-24 NOTE — Telephone Encounter (Signed)
Patient called stating he pharmacy never received that script for potassium

## 2018-07-24 NOTE — Addendum Note (Signed)
Addended by: Thompson Grayer on: 07/24/2018 11:05 AM   Modules accepted: Orders

## 2018-07-24 NOTE — Telephone Encounter (Signed)
Resent potassium as requested.

## 2018-07-24 NOTE — Telephone Encounter (Signed)
Noted  

## 2018-07-28 NOTE — Telephone Encounter (Signed)
Can try sotalol vs ablation for PVCs

## 2018-08-02 ENCOUNTER — Other Ambulatory Visit: Payer: Self-pay | Admitting: Cardiovascular Disease

## 2018-08-04 ENCOUNTER — Other Ambulatory Visit: Payer: Self-pay

## 2018-08-04 ENCOUNTER — Other Ambulatory Visit: Payer: Medicare Other | Admitting: *Deleted

## 2018-08-04 ENCOUNTER — Ambulatory Visit (HOSPITAL_COMMUNITY): Payer: Medicare Other | Attending: Cardiovascular Disease

## 2018-08-04 DIAGNOSIS — E785 Hyperlipidemia, unspecified: Secondary | ICD-10-CM | POA: Diagnosis not present

## 2018-08-04 DIAGNOSIS — I10 Essential (primary) hypertension: Secondary | ICD-10-CM | POA: Insufficient documentation

## 2018-08-04 DIAGNOSIS — I5022 Chronic systolic (congestive) heart failure: Secondary | ICD-10-CM

## 2018-08-04 DIAGNOSIS — I493 Ventricular premature depolarization: Secondary | ICD-10-CM | POA: Insufficient documentation

## 2018-08-04 DIAGNOSIS — I2510 Atherosclerotic heart disease of native coronary artery without angina pectoris: Secondary | ICD-10-CM | POA: Diagnosis not present

## 2018-08-04 DIAGNOSIS — I480 Paroxysmal atrial fibrillation: Secondary | ICD-10-CM | POA: Diagnosis not present

## 2018-08-04 LAB — BASIC METABOLIC PANEL
BUN/Creatinine Ratio: 13 (ref 10–24)
BUN: 17 mg/dL (ref 8–27)
CO2: 23 mmol/L (ref 20–29)
Calcium: 9.1 mg/dL (ref 8.6–10.2)
Chloride: 104 mmol/L (ref 96–106)
Creatinine, Ser: 1.31 mg/dL — ABNORMAL HIGH (ref 0.76–1.27)
GFR calc Af Amer: 63 mL/min/{1.73_m2} (ref >59)
GFR calc non Af Amer: 54 mL/min/{1.73_m2} — ABNORMAL LOW (ref >59)
Glucose: 90 mg/dL (ref 65–99)
Potassium: 4.2 mmol/L (ref 3.5–5.2)
Sodium: 142 mmol/L (ref 134–144)

## 2018-08-04 LAB — MAGNESIUM: Magnesium: 2.1 mg/dL (ref 1.6–2.3)

## 2018-08-07 ENCOUNTER — Encounter: Payer: Self-pay | Admitting: Physician Assistant

## 2018-08-12 ENCOUNTER — Other Ambulatory Visit: Payer: Self-pay

## 2018-08-12 ENCOUNTER — Encounter: Payer: Self-pay | Admitting: Cardiology

## 2018-08-12 ENCOUNTER — Ambulatory Visit (INDEPENDENT_AMBULATORY_CARE_PROVIDER_SITE_OTHER): Payer: Medicare Other | Admitting: Cardiology

## 2018-08-12 VITALS — BP 124/72 | HR 60 | Ht 70.0 in | Wt 216.0 lb

## 2018-08-12 DIAGNOSIS — I493 Ventricular premature depolarization: Secondary | ICD-10-CM | POA: Diagnosis not present

## 2018-08-12 NOTE — Patient Instructions (Addendum)
Medication Instructions:  Your physician recommends that you continue on your current medications as directed. Please refer to the Current Medication list given to you today.  * If you need a refill on your cardiac medications before your next appointment, please call your pharmacy.   Labwork: None ordered  Testing/Procedures: Your physician has recommended that you have an ablation. Catheter ablation is a medical procedure used to treat some cardiac arrhythmias (irregular heartbeats). During catheter ablation, a long, thin, flexible tube is put into a blood vessel in your groin (upper thigh), or neck. This tube is called an ablation catheter. It is then guided to your heart through the blood vessel. Radio frequency waves destroy small areas of heart tissue where abnormal heartbeats may cause an arrhythmia to start.   The nurse will call you to arrange this procedure for October.  Instructions for your ablation: 1. Please arrive at the [facility], Main Entrance "A", of [facility] at ______ on ________. 2. Do not eat or drink after midnight the night prior to the procedure. 3. Hold Eliquis 2 days prior to procedure.  Take your last dose on _____ 4. Do not take any medications the morning of the procedure. 5. Both of your groins will need to be shaved for this procedure (if needed). We ask that you do this yourself at home 1-2 days prior to the procedure.  If you are unable/uncomfortable to do yourself, the hospital staff will shave you the day of your procedure (if needed). 6. Plan for an overnight stay in the hospital. 7. You will need someone to drive you home at discharge.   Follow-Up: To be determined once procedure is scheduled.  *Please note that any paperwork needing to be filled out by the provider will need to be addressed at the front desk prior to seeing the provider. Please note that any FMLA, disability or other documents regarding health condition is subject to a  $25.00 charge that must be received prior to completion of paperwork in the form of a money order or check.  Thank you for choosing [facility]!!   Trinidad Curet, RN [phone]  Any Other Special Instructions Will Be Listed Below (If Applicable).  Cardiac Ablation Cardiac ablation is a procedure to disable (ablate) a small amount of heart tissue in very specific places. The heart has many electrical connections. Sometimes these connections are abnormal and can cause the heart to beat very fast or irregularly. Ablating some of the problem areas can improve the heart rhythm or return it to normal. Ablation may be done for people who:  Have Wolff-Parkinson-White syndrome.  Have fast heart rhythms (tachycardia).  Have taken medicines for an abnormal heart rhythm (arrhythmia) that were not effective or caused side effects.  Have a high-risk heartbeat that may be life-threatening. During the procedure, a small incision is made in the neck or the groin, and a long, thin, flexible tube (catheter) is inserted into the incision and moved to the heart. Small devices (electrodes) on the tip of the catheter will send out electrical currents. A type of X-ray (fluoroscopy) will be used to help guide the catheter and to provide images of the heart. Tell a health care provider about:  Any allergies you have.  All medicines you are taking, including vitamins, herbs, eye drops, creams, and over-the-counter medicines.  Any problems you or family members have had with anesthetic medicines.  Any blood disorders you have.  Any surgeries you have had.  Any medical  conditions you have, such as kidney failure.  Whether you are pregnant or may be pregnant. What are the risks? Generally, this is a safe procedure. However, problems may occur, including:  Infection.  Bruising and bleeding at the catheter insertion site.  Bleeding into the chest, especially into the sac that surrounds the heart.  This is a serious complication.  Stroke or blood clots.  Damage to other structures or organs.  Allergic reaction to medicines or dyes.  Need for a permanent pacemaker if the normal electrical system is damaged. A pacemaker is a small computer that sends electrical signals to the heart and helps your heart beat normally.  The procedure not being fully effective. This may not be recognized until months later. Repeat ablation procedures are sometimes required. What happens before the procedure?  Follow instructions from your health care provider about eating or drinking restrictions.  Ask your health care provider about: ? Changing or stopping your regular medicines. This is especially important if you are taking diabetes medicines or blood thinners. ? Taking medicines such as aspirin and ibuprofen. These medicines can thin your blood. Do not take these medicines before your procedure if your health care provider instructs you not to.  Plan to have someone take you home from the hospital or clinic.  If you will be going home right after the procedure, plan to have someone with you for 24 hours. What happens during the procedure?  To lower your risk of infection: ? Your health care team will wash or sanitize their hands. ? Your skin will be washed with soap. ? Hair may be removed from the incision area.  An IV tube will be inserted into one of your veins.  You will be given a medicine to help you relax (sedative).  The skin on your neck or groin will be numbed.  An incision will be made in your neck or your groin.  A needle will be inserted through the incision and into a large vein in your neck or groin.  A catheter will be inserted into the needle and moved to your heart.  Dye may be injected through the catheter to help your surgeon see the area of the heart that needs treatment.  Electrical currents will be sent from the catheter to ablate heart tissue in desired areas.  There are three types of energy that may be used to ablate heart tissue: ? Heat (radiofrequency energy). ? Laser energy. ? Extreme cold (cryoablation).  When the necessary tissue has been ablated, the catheter will be removed.  Pressure will be held on the catheter insertion area to prevent excessive bleeding.  A bandage (dressing) will be placed over the catheter insertion area. The procedure may vary among health care providers and hospitals. What happens after the procedure?  Your blood pressure, heart rate, breathing rate, and blood oxygen level will be monitored until the medicines you were given have worn off.  Your catheter insertion area will be monitored for bleeding. You will need to lie still for a few hours to ensure that you do not bleed from the catheter insertion area.  Do not drive for 24 hours or as long as directed by your health care provider. Summary  Cardiac ablation is a procedure to disable (ablate) a small amount of heart tissue in very specific places. Ablating some of the problem areas can improve the heart rhythm or return it to normal.  During the procedure, electrical currents will be sent from the catheter  to ablate heart tissue in desired areas. This information is not intended to replace advice given to you by your health care provider. Make sure you discuss any questions you have with your health care provider. Document Released: 05/06/2008 Document Revised: 06/10/2017 Document Reviewed: 11/07/2015 Elsevier Patient Education  2020 [employer].

## 2018-08-12 NOTE — Progress Notes (Signed)
Electrophysiology Office Note   Date:  08/12/2018   ID:  Logan Sanders, DOB Dec 30, 1945, MRN [id number]  PCP:  Kelton Pillar, MD  Cardiologist:  Johnsie Cancel Primary Electrophysiologist:  Naira Standiford [name], MD    No chief complaint on file.    History of Present Illness: Logan Sanders is a 73 y.o. male who is being seen today for the evaluation of PVCs at the request of Kelton Pillar, MD. Presenting today for electrophysiology evaluation.  He has a history of paroxysmal atrial fibrillation on Eliquis, coronary artery disease, hypertension, hyperlipidemia, hypothyroidism, and PVCs.    Today, he denies symptoms of , chest pain, shortness of breath, orthopnea, PND, lower extremity edema, claudication, dizziness, presyncope, syncope, bleeding, or neurologic sequela. The patient is tolerating medications without difficulties.  He is continued to note palpitations.  His palpitations come and go.  These are associated with intermittent episodes of weakness and fatigue.  He states that the mexiletine made his feel like the palpitations got worse.   Past Medical History:  Diagnosis Date  . BPH (benign prostatic hyperplasia)   . CAD in native artery   . Complication of anesthesia    gets really anxious when waking up  . ED (erectile dysfunction)   . Hyperlipidemia   . Hypertension   . Hypothyroidism   . Osteoarthritis   . PVC's (premature ventricular contractions)    3-day cardiac monitor 07/2018: NSR, frequent PVCs (PVC burden 15.9%); rare PACs and rare episode of non-sustained SVT  //  Echo 08/2018: EF 55-60, mild LVH, normal RV SF, mild LAE, mild dilation of aortic root and ascending aorta (38 mm)   . Thyroid disease    Past Surgical History:  Procedure Laterality Date  . CARDIAC CATHETERIZATION  11/09/13   non obstructive disease in all vessels.  . COLONOSCOPY    . KNEE ARTHROSCOPY Left    x3  . LEFT HEART CATHETERIZATION WITH CORONARY ANGIOGRAM N/A 11/09/2013   Procedure:  LEFT HEART CATHETERIZATION WITH CORONARY ANGIOGRAM;  Surgeon: Blane Ohara, MD;  Location: [facility] CATH LAB;  Service: Cardiovascular;  Laterality: N/A;  . LOWER BACK SURGERY  1985  . LUMBAR LAMINECTOMY/DECOMPRESSION MICRODISCECTOMY N/A 12/18/2017   Procedure: LAMINECTOMY AND FORAMINOTOMY LUMBAR TWO- LUMBAR THREE, LUMBAR THREE- LUMBAR FOUR, LUMBAR FOUR- LUMBAR FIVE;  Surgeon: Newman Pies, MD;  Location: [facility];  Service: Neurosurgery;  Laterality: N/A;  . NECK SURGERY  1999  . REPLACEMENT TOTAL KNEE  2005   LEFT KNEE  . WISDOM TOOTH EXTRACTION       Current Outpatient Medications  Medication Sig Dispense Refill  . amLODipine (NORVASC) 5 MG tablet TAKE 1 TABLET ONCE DAILY. 30 tablet 10  . atorvastatin (LIPITOR) 40 MG tablet TAKE 1 TABLET BY MOUTH DAILY. 90 tablet 2  . carvedilol (COREG) 6.25 MG tablet Take 1 tablet (6.25 mg total) by mouth 2 (two) times daily. 180 tablet 3  . Co-Enzyme Q-10 100 MG CAPS Take 100 mg by mouth daily.     Marland Kitchen doxazosin (CARDURA) 8 MG tablet Take 8 mg by mouth at bedtime.     Marland Kitchen ELIQUIS 5 MG TABS tablet TAKE 1 TABLET BY MOUTH TWICE DAILY. 60 tablet 5  . fexofenadine (ALLEGRA) 180 MG tablet Take 180 mg by mouth daily as needed for allergies.     . hydrochlorothiazide (HYDRODIURIL) 12.5 MG tablet Take 12.5 mg by mouth daily.    . multivitamin-iron-minerals-folic acid (CENTRUM) chewable tablet Chew 1 tablet by mouth daily.    Marland Kitchen  olmesartan (BENICAR) 40 MG tablet Take 40 mg by mouth daily.    . Omega-3 Fatty Acids (FISH OIL PO) Take 1 capsule by mouth daily.    Marland Kitchen omeprazole (PRILOSEC) 40 MG capsule Take 40 mg by mouth daily as needed (for acid reflux).    . potassium chloride (K-DUR) 10 MEQ tablet Take 1 tablet (10 mEq total) by mouth daily. 30 tablet 11  . SYNTHROID 100 MCG tablet Take 100 mcg by mouth daily before breakfast.     . traZODone (DESYREL) 50 MG tablet Take 50 mg by mouth at bedtime as needed for sleep.     Marland Kitchen zolpidem (AMBIEN) 10 MG tablet Take 10 mg by  mouth at bedtime as needed for sleep.      No current facility-administered medications for this visit.     Allergies:   Patient has no known allergies.   Social History:  The patient  reports that he quit smoking about 19 years ago. He quit smokeless tobacco use about 23 years ago. He reports current alcohol use of about 1.0 standard drinks of alcohol per week. He reports that he does not use drugs.   Family History:  The patient's family history includes Asthma in his mother; Heart attack in his father; Heart disease in his father; Hyperlipidemia in his brother; Parkinsonism in his mother; Prostate cancer in his father.    ROS:  Please see the history of present illness.   Otherwise, review of systems is positive for none.   All other systems are reviewed and negative.    PHYSICAL EXAM: VS:  BP 124/72   Pulse 60   Ht 5\' 10"  (1.778 m)   Wt 216 lb (98 kg)   BMI 30.99 kg/m  , BMI Body mass index is 30.99 kg/m. GEN: Well nourished, well developed, in no acute distress  HEENT: normal  Neck: no JVD, carotid bruits, or masses Cardiac: iRRR; no murmurs, rubs, or gallops,no edema  Respiratory:  clear to auscultation bilaterally, normal work of breathing GI: soft, nontender, nondistended, + BS MS: no deformity or atrophy  Skin: warm and dry Neuro:  Strength and sensation are intact Psych: euthymic mood, full affect  EKG:  EKG is not ordered today. Personal review of the ekg ordered 07/18/2018 shows sinus rhythm, ventricular bigeminy  Recent Labs: 06/13/2018: Hemoglobin 15.3; Platelets 227; TSH 1.840 08/04/2018: BUN 17; Creatinine, Ser 1.31; Magnesium 2.1; Potassium 4.2; Sodium 142    Lipid Panel     Component Value Date/Time   CHOL 113 07/19/2017 0755   TRIG 68 07/19/2017 0755   HDL 38 (L) 07/19/2017 0755   CHOLHDL 3.0 07/19/2017 0755   CHOLHDL 2.6 07/28/2015 1042   VLDL 17 07/28/2015 1042   LDLCALC 61 07/19/2017 0755     Wt Readings from Last 3 Encounters:  08/12/18 216  lb (98 kg)  07/18/18 218 lb (98.9 kg)  07/15/18 218 lb (98.9 kg)      Other studies Reviewed: Additional studies/ records that were reviewed today include: TTE 08/04/18  Review of the above records today demonstrates:   1. The left ventricle has normal systolic function, with an ejection fraction of 55-60%. The cavity size was normal. There is mildly increased left ventricular wall thickness. Left ventricular diastolic parameters were normal.  2. The right ventricle has normal systolic function. The cavity was normal.  3. Left atrial size was mildly dilated.  4. The mitral valve is abnormal. Mild thickening of the mitral valve leaflet.  5. The tricuspid  valve is grossly normal.  6. The aortic valve is tricuspid. Mild thickening of the aortic valve. No stenosis of the aortic valve.  7. The aorta is abnormal in size and structure.  8. There is mild dilatation of the aortic root and of the ascending aorta measuring 38 mm.  9. Normal LV function; mild LVH; mild LAE.    ASSESSMENT AND PLAN:  1.  PVCs: High burden at 15%.  Appear to be coming from the outflow tract.  Are likely RVOT versus right coronary cusp.  At this point, he would like to avoid medications and would prefer ablation.  Risks and benefits were discussed and include bleeding, tamponade, heart block, stroke.  The patient understand these risks and is agreed to the procedure.  2.  Paroxysmal atrial fibrillation: Currently on Eliquis.  Chads vasc 3.  Logan Sanders need to hold Eliquis 2 days prior to his ablation.  3.  Hypertension: Currently well controlled.  4.  Coronary artery disease: Nonobstructive by cath in 2015.  Currently on aspirin and Eliquis.    Current medicines are reviewed at length with the patient today.   The patient does not have concerns regarding his medicines.  The following changes were made today:  none  Labs/ tests ordered today include:  No orders of the defined types were placed in this encounter.  Case  discussed with primary cardiology  Disposition:   FU with Ninette Cotta 3 months  Signed, Logan Sanders [name], MD  08/12/2018 4:19 PM     [address] [place] [address] [phone] (office) [phone] (fax)

## 2018-08-16 ENCOUNTER — Other Ambulatory Visit: Payer: Self-pay | Admitting: Cardiovascular Disease

## 2018-08-26 ENCOUNTER — Telehealth: Payer: Self-pay | Admitting: Cardiology

## 2018-08-26 NOTE — Telephone Encounter (Signed)
See mychart message from today in relation to this question

## 2018-08-26 NOTE — Telephone Encounter (Signed)
Patient called stating he was to be scheduled for an ablation he still hasn't heard anything yet. He would like a call back to schedule.

## 2018-08-29 ENCOUNTER — Other Ambulatory Visit: Payer: Self-pay

## 2018-08-29 DIAGNOSIS — Z20822 Contact with and (suspected) exposure to covid-19: Secondary | ICD-10-CM

## 2018-08-30 LAB — NOVEL CORONAVIRUS, NAA: SARS-CoV-2, NAA: NOT DETECTED

## 2018-09-03 ENCOUNTER — Telehealth: Payer: Self-pay | Admitting: *Deleted

## 2018-09-03 NOTE — Telephone Encounter (Signed)
Reviewed procedure instructions w/ pt. Instruction letter sent via [place]. Pre procedure labs & COVID testing scheduled. Patient verbalized understanding and agreeable to plan.

## 2018-09-10 ENCOUNTER — Ambulatory Visit: Payer: Medicare Other | Admitting: Cardiology

## 2018-09-30 ENCOUNTER — Ambulatory Visit (INDEPENDENT_AMBULATORY_CARE_PROVIDER_SITE_OTHER): Payer: Medicare Other | Admitting: Cardiology

## 2018-09-30 ENCOUNTER — Telehealth: Payer: Self-pay | Admitting: *Deleted

## 2018-09-30 ENCOUNTER — Other Ambulatory Visit: Payer: Self-pay

## 2018-09-30 ENCOUNTER — Encounter: Payer: Self-pay | Admitting: Cardiology

## 2018-09-30 VITALS — BP 144/72 | HR 65 | Ht 70.0 in | Wt 220.0 lb

## 2018-09-30 DIAGNOSIS — I493 Ventricular premature depolarization: Secondary | ICD-10-CM

## 2018-09-30 DIAGNOSIS — Z01812 Encounter for preprocedural laboratory examination: Secondary | ICD-10-CM

## 2018-09-30 LAB — BASIC METABOLIC PANEL
BUN/Creatinine Ratio: 13 (ref 10–24)
BUN: 16 mg/dL (ref 8–27)
CO2: 21 mmol/L (ref 20–29)
Calcium: 9.2 mg/dL (ref 8.6–10.2)
Chloride: 102 mmol/L (ref 96–106)
Creatinine, Ser: 1.28 mg/dL — ABNORMAL HIGH (ref 0.76–1.27)
GFR calc Af Amer: 65 mL/min/{1.73_m2} (ref >59)
GFR calc non Af Amer: 56 mL/min/{1.73_m2} — ABNORMAL LOW (ref >59)
Glucose: 106 mg/dL — ABNORMAL HIGH (ref 65–99)
Potassium: 3.9 mmol/L (ref 3.5–5.2)
Sodium: 137 mmol/L (ref 134–144)

## 2018-09-30 LAB — CBC
Hematocrit: 40.3 % (ref 37.5–51.0)
Hemoglobin: 14.1 g/dL (ref 13.0–17.7)
MCH: 30.1 pg (ref 26.6–33.0)
MCHC: 35 g/dL (ref 31.5–35.7)
MCV: 86 fL (ref 79–97)
Platelets: 198 10*3/uL (ref 150–450)
RBC: 4.68 x10E6/uL (ref 4.14–5.80)
RDW: 13.6 % (ref 11.6–15.4)
WBC: 6.7 10*3/uL (ref 3.4–10.8)

## 2018-09-30 NOTE — Telephone Encounter (Signed)
Pt advised to hold his Eliquis Sunday & Monday for upcoming procedure. Patient verbalized understanding and agreeable to plan.

## 2018-09-30 NOTE — Progress Notes (Signed)
Electrophysiology Office Note   Date:  09/30/2018   ID:  MATHIEU ZEHM, DOB 1945-11-14, MRN [id number]  PCP:  Kelton Pillar, MD  Cardiologist:  Johnsie Cancel Primary Electrophysiologist:  Trenace Coughlin [name], MD    No chief complaint on file.    History of Present Illness: Logan Sanders is a 73 y.o. male who is being seen today for the evaluation of PVCs at the request of Kelton Pillar, MD. Presenting today for electrophysiology evaluation.  He has a history of paroxysmal atrial fibrillation on Eliquis, coronary artery disease, hypertension, hyperlipidemia, hypothyroidism, and PVCs.    Today, denies symptoms of chest pain, shortness of breath, orthopnea, PND, lower extremity edema, claudication, dizziness, presyncope, syncope, bleeding, or neurologic sequela. The patient is tolerating medications without difficulties.  He has been feeling overall fairly well.  He does continue to have palpitations that he feels are due to his PVCs and not his atrial fibrillation.   Past Medical History:  Diagnosis Date  . BPH (benign prostatic hyperplasia)   . CAD in native artery   . Complication of anesthesia    gets really anxious when waking up  . ED (erectile dysfunction)   . Hyperlipidemia   . Hypertension   . Hypothyroidism   . Osteoarthritis   . PVC's (premature ventricular contractions)    3-day cardiac monitor 07/2018: NSR, frequent PVCs (PVC burden 15.9%); rare PACs and rare episode of non-sustained SVT  //  Echo 08/2018: EF 55-60, mild LVH, normal RV SF, mild LAE, mild dilation of aortic root and ascending aorta (38 mm)   . Thyroid disease    Past Surgical History:  Procedure Laterality Date  . CARDIAC CATHETERIZATION  11/09/13   non obstructive disease in all vessels.  . COLONOSCOPY    . KNEE ARTHROSCOPY Left    x3  . LEFT HEART CATHETERIZATION WITH CORONARY ANGIOGRAM N/A 11/09/2013   Procedure: LEFT HEART CATHETERIZATION WITH CORONARY ANGIOGRAM;  Surgeon: Blane Ohara,  MD;  Location[facility] CATH LAB;  Service: Cardiovascular;  Laterality: N/A;  . LOWER BACK SURGERY  1985  . LUMBAR LAMINECTOMY/DECOMPRESSION MICRODISCECTOMY N/A 12/18/2017   Procedure: LAMINECTOMY AND FORAMINOTOMY LUMBAR TWO- LUMBAR THREE, LUMBAR THREE- LUMBAR FOUR, LUMBAR FOUR- LUMBAR FIVE;  Surgeon: Newman Pies, MD;  Location: [facility];  Service: Neurosurgery;  Laterality: N/A;  . NECK SURGERY  1999  . REPLACEMENT TOTAL KNEE  2005   LEFT KNEE  . WISDOM TOOTH EXTRACTION       Current Outpatient Medications  Medication Sig Dispense Refill  . amLODipine (NORVASC) 5 MG tablet TAKE 1 TABLET ONCE DAILY. 30 tablet 10  . atorvastatin (LIPITOR) 40 MG tablet TAKE 1 TABLET BY MOUTH DAILY. 90 tablet 2  . carvedilol (COREG) 6.25 MG tablet Take 1 tablet (6.25 mg total) by mouth 2 (two) times daily. 180 tablet 3  . Co-Enzyme Q-10 100 MG CAPS Take 100 mg by mouth daily.     Marland Kitchen doxazosin (CARDURA) 8 MG tablet Take 8 mg by mouth at bedtime.     Marland Kitchen ELIQUIS 5 MG TABS tablet TAKE 1 TABLET BY MOUTH TWICE DAILY. 60 tablet 5  . fexofenadine (ALLEGRA) 180 MG tablet Take 180 mg by mouth daily as needed for allergies.     . hydrochlorothiazide (HYDRODIURIL) 12.5 MG tablet Take 12.5 mg by mouth daily.    . multivitamin-iron-minerals-folic acid (CENTRUM) chewable tablet Chew 1 tablet by mouth daily.    Marland Kitchen olmesartan (BENICAR) 40 MG tablet Take 40 mg by mouth daily.    Marland Kitchen  Omega-3 Fatty Acids (FISH OIL PO) Take 1 capsule by mouth daily.    Marland Kitchen omeprazole (PRILOSEC) 40 MG capsule Take 40 mg by mouth daily as needed (for acid reflux).    . potassium chloride (K-DUR) 10 MEQ tablet Take 1 tablet (10 mEq total) by mouth daily. 30 tablet 11  . SYNTHROID 100 MCG tablet Take 100 mcg by mouth daily before breakfast.     . traZODone (DESYREL) 50 MG tablet Take 50 mg by mouth at bedtime as needed for sleep.     Marland Kitchen zolpidem (AMBIEN) 10 MG tablet Take 10 mg by mouth at bedtime as needed for sleep.      No current facility-administered  medications for this visit.     Allergies:   Patient has no known allergies.   Social History:  The patient  reports that he quit smoking about 19 years ago. He quit smokeless tobacco use about 23 years ago. He reports current alcohol use of about 1.0 standard drinks of alcohol per week. He reports that he does not use drugs.   Family History:  The patient's family history includes Asthma in his mother; Heart attack in his father; Heart disease in his father; Hyperlipidemia in his brother; Parkinsonism in his mother; Prostate cancer in his father.   ROS:  Please see the history of present illness.   Otherwise, review of systems is positive for none.   All other systems are reviewed and negative.   PHYSICAL EXAM: VS:  BP (!) 144/72   Pulse 65   Ht 5\' 10"  (1.778 m)   Wt 220 lb (99.8 kg)   SpO2 97%   BMI 31.57 kg/m  , BMI Body mass index is 31.57 kg/m. GEN: Well nourished, well developed, in no acute distress  HEENT: normal  Neck: no JVD, carotid bruits, or masses Cardiac: RRR; no murmurs, rubs, or gallops,no edema  Respiratory:  clear to auscultation bilaterally, normal work of breathing GI: soft, nontender, nondistended, + BS MS: no deformity or atrophy  Skin: warm and dry Neuro:  Strength and sensation are intact Psych: euthymic mood, full affect  EKG:  EKG is ordered today. Personal review of the ekg ordered shows SR, PVCs   Recent Labs: 06/13/2018: Hemoglobin 15.3; Platelets 227; TSH 1.840 08/04/2018: BUN 17; Creatinine, Ser 1.31; Magnesium 2.1; Potassium 4.2; Sodium 142    Lipid Panel     Component Value Date/Time   CHOL 113 07/19/2017 0755   TRIG 68 07/19/2017 0755   HDL 38 (L) 07/19/2017 0755   CHOLHDL 3.0 07/19/2017 0755   CHOLHDL 2.6 07/28/2015 1042   VLDL 17 07/28/2015 1042   LDLCALC 61 07/19/2017 0755     Wt Readings from Last 3 Encounters:  09/30/18 220 lb (99.8 kg)  08/12/18 216 lb (98 kg)  07/18/18 218 lb (98.9 kg)      Other studies Reviewed:  Additional studies/ records that were reviewed today include: TTE 08/04/18  Review of the above records today demonstrates:   1. The left ventricle has normal systolic function, with an ejection fraction of 55-60%. The cavity size was normal. There is mildly increased left ventricular wall thickness. Left ventricular diastolic parameters were normal.  2. The right ventricle has normal systolic function. The cavity was normal.  3. Left atrial size was mildly dilated.  4. The mitral valve is abnormal. Mild thickening of the mitral valve leaflet.  5. The tricuspid valve is grossly normal.  6. The aortic valve is tricuspid. Mild thickening of the  aortic valve. No stenosis of the aortic valve.  7. The aorta is abnormal in size and structure.  8. There is mild dilatation of the aortic root and of the ascending aorta measuring 38 mm.  9. Normal LV function; mild LVH; mild LAE.    ASSESSMENT AND PLAN:  1.  PVCs: High burden at 15%.  Appears to be coming from the outflow tracts.  We Lamir Racca plan for ablation.  Risks and benefits were discussed include bleeding, tamponade, heart block, stroke among others.  He understands these risks and is agreed to the procedure.    2.  Paroxysmal atrial fibrillation: Currently on Eliquis.  Hesham Womac need to hold Eliquis prior to PVC ablation.  This patients CHA2DS2-VASc Score and unadjusted Ischemic Stroke Rate (% per year) is equal to 3.2 % stroke rate/year from a score of 3  Above score calculated as 1 point each if present [CHF, HTN, DM, Vascular=MI/PAD/Aortic Plaque, Age if 65-74, or Male] Above score calculated as 2 points each if present [Age > 75, or Stroke/TIA/TE]   3.  Hypertension: mildly elevated. Ivry Pigue reassess post ablation  4.  Coronary artery disease: Nonobstructive by cath in 2015.  Currently on aspirin and Eliquis.    Current medicines are reviewed at length with the patient today.   The patient does not have concerns regarding his medicines.   The following changes were made today:  none  Labs/ tests ordered today include:  Orders Placed This Encounter  Procedures  . Basic metabolic panel  . CBC  . EKG 12-Lead   Disposition:   FU with Brinson Tozzi 3 months  Signed, Japheth Diekman [name], MD  09/30/2018 8:57 AM     [facility] [address] [place] [address] [phone] (office) [phone] (fax)

## 2018-09-30 NOTE — Patient Instructions (Signed)
Pre procedure labs today: BMET, CBC

## 2018-10-03 ENCOUNTER — Other Ambulatory Visit: Payer: Medicare Other

## 2018-10-03 ENCOUNTER — Other Ambulatory Visit (HOSPITAL_COMMUNITY): Payer: Medicare Other

## 2018-10-04 ENCOUNTER — Other Ambulatory Visit (HOSPITAL_COMMUNITY)
Admission: RE | Admit: 2018-10-04 | Discharge: 2018-10-04 | Disposition: A | Discharge status: Home / Self Care | Payer: Medicare Other | Source: Ambulatory Visit | Attending: Cardiology | Admitting: Cardiology

## 2018-10-04 DIAGNOSIS — Z01812 Encounter for preprocedural laboratory examination: Secondary | ICD-10-CM | POA: Diagnosis present

## 2018-10-04 DIAGNOSIS — Z20828 Contact with and (suspected) exposure to other viral communicable diseases: Secondary | ICD-10-CM | POA: Insufficient documentation

## 2018-10-06 LAB — NOVEL CORONAVIRUS, NAA (HOSP ORDER, SEND-OUT TO REF LAB; TAT 18-24 HRS): SARS-CoV-2, NAA: NOT DETECTED

## 2018-10-07 ENCOUNTER — Encounter (HOSPITAL_COMMUNITY)
Admission: RE | Disposition: A | Discharge status: Home / Self Care | Payer: Medicare Other | Source: Home / Self Care | Attending: Cardiology

## 2018-10-07 ENCOUNTER — Ambulatory Visit (HOSPITAL_COMMUNITY): Payer: Medicare Other | Admitting: Anesthesiology

## 2018-10-07 ENCOUNTER — Telehealth: Payer: Self-pay | Admitting: Cardiology

## 2018-10-07 ENCOUNTER — Other Ambulatory Visit: Payer: Self-pay

## 2018-10-07 ENCOUNTER — Ambulatory Visit (HOSPITAL_COMMUNITY)
Admission: RE | Admit: 2018-10-07 | Discharge: 2018-10-07 | Disposition: A | Discharge status: Home / Self Care | Payer: Medicare Other | Attending: Cardiology | Admitting: Cardiology

## 2018-10-07 ENCOUNTER — Encounter (HOSPITAL_COMMUNITY): Payer: Self-pay | Admitting: Anesthesiology

## 2018-10-07 DIAGNOSIS — I110 Hypertensive heart disease with heart failure: Secondary | ICD-10-CM | POA: Insufficient documentation

## 2018-10-07 DIAGNOSIS — I5022 Chronic systolic (congestive) heart failure: Secondary | ICD-10-CM | POA: Diagnosis not present

## 2018-10-07 DIAGNOSIS — N400 Enlarged prostate without lower urinary tract symptoms: Secondary | ICD-10-CM | POA: Diagnosis not present

## 2018-10-07 DIAGNOSIS — Z7901 Long term (current) use of anticoagulants: Secondary | ICD-10-CM | POA: Diagnosis not present

## 2018-10-07 DIAGNOSIS — I2510 Atherosclerotic heart disease of native coronary artery without angina pectoris: Secondary | ICD-10-CM | POA: Diagnosis not present

## 2018-10-07 DIAGNOSIS — I493 Ventricular premature depolarization: Secondary | ICD-10-CM | POA: Insufficient documentation

## 2018-10-07 DIAGNOSIS — E079 Disorder of thyroid, unspecified: Secondary | ICD-10-CM | POA: Diagnosis not present

## 2018-10-07 DIAGNOSIS — M1990 Unspecified osteoarthritis, unspecified site: Secondary | ICD-10-CM | POA: Diagnosis not present

## 2018-10-07 DIAGNOSIS — E785 Hyperlipidemia, unspecified: Secondary | ICD-10-CM | POA: Insufficient documentation

## 2018-10-07 DIAGNOSIS — Z79899 Other long term (current) drug therapy: Secondary | ICD-10-CM | POA: Insufficient documentation

## 2018-10-07 DIAGNOSIS — I471 Supraventricular tachycardia: Secondary | ICD-10-CM

## 2018-10-07 DIAGNOSIS — Z79890 Hormone replacement therapy (postmenopausal): Secondary | ICD-10-CM | POA: Diagnosis not present

## 2018-10-07 DIAGNOSIS — Z87891 Personal history of nicotine dependence: Secondary | ICD-10-CM | POA: Diagnosis not present

## 2018-10-07 HISTORY — PX: PVC ABLATION: EP1236

## 2018-10-07 LAB — POCT ACTIVATED CLOTTING TIME
Activated Clotting Time: 180 seconds
Activated Clotting Time: 208 seconds
Activated Clotting Time: 175 seconds

## 2018-10-07 SURGERY — PVC ABLATION
Anesthesia: Monitor Anesthesia Care

## 2018-10-07 MED ORDER — FENTANYL CITRATE (PF) 100 MCG/2ML IJ SOLN
INTRAMUSCULAR | Status: DC | PRN
  Administered 2018-10-07 (×2): 50 ug via INTRAVENOUS

## 2018-10-07 MED ORDER — ONDANSETRON HCL 4 MG/2ML IJ SOLN: 4.0000 mg | Freq: Four times a day (QID) | INTRAMUSCULAR | Status: DC | PRN

## 2018-10-07 MED ORDER — HEPARIN SODIUM (PORCINE) 1000 UNIT/ML IJ SOLN
INTRAMUSCULAR | Status: AC
  Filled 2018-10-07: qty 1

## 2018-10-07 MED ORDER — HEPARIN (PORCINE) IN NACL 1000-0.9 UT/500ML-% IV SOLN
INTRAVENOUS | Status: DC | PRN
  Administered 2018-10-07: 500 mL

## 2018-10-07 MED ORDER — HEPARIN (PORCINE) IN NACL 1000-0.9 UT/500ML-% IV SOLN
INTRAVENOUS | Status: AC
  Filled 2018-10-07: qty 500

## 2018-10-07 MED ORDER — BUPIVACAINE HCL (PF) 0.25 % IJ SOLN
INTRAMUSCULAR | Status: AC
  Filled 2018-10-07: qty 30

## 2018-10-07 MED ORDER — SODIUM CHLORIDE 0.9 % IV SOLN
INTRAVENOUS | Status: DC
  Administered 2018-10-07: 07:00:00 via INTRAVENOUS

## 2018-10-07 MED ORDER — MIDAZOLAM HCL 5 MG/5ML IJ SOLN
INTRAMUSCULAR | Status: DC | PRN
  Administered 2018-10-07: 2 mg via INTRAVENOUS

## 2018-10-07 MED ORDER — PROPOFOL 10 MG/ML IV BOLUS
INTRAVENOUS | Status: DC | PRN
  Administered 2018-10-07: 20 mg via INTRAVENOUS
  Administered 2018-10-07: 30 mg via INTRAVENOUS
  Administered 2018-10-07: 20 mg via INTRAVENOUS

## 2018-10-07 MED ORDER — SODIUM CHLORIDE 0.9 % IV SOLN: 250.0000 mL | INTRAVENOUS | Status: DC | PRN

## 2018-10-07 MED ORDER — SODIUM CHLORIDE 0.9% FLUSH: 3.0000 mL | INTRAVENOUS | Status: DC | PRN

## 2018-10-07 MED ORDER — HEPARIN SODIUM (PORCINE) 1000 UNIT/ML IJ SOLN
INTRAMUSCULAR | Status: DC | PRN
  Administered 2018-10-07: 1000 [IU] via INTRAVENOUS

## 2018-10-07 MED ORDER — HEPARIN SODIUM (PORCINE) 1000 UNIT/ML IJ SOLN
INTRAMUSCULAR | Status: DC | PRN
  Administered 2018-10-07 (×2): 5000 [IU] via INTRAVENOUS

## 2018-10-07 MED ORDER — PROPOFOL 500 MG/50ML IV EMUL
INTRAVENOUS | Status: DC | PRN
  Administered 2018-10-07: 50 ug/kg/min via INTRAVENOUS

## 2018-10-07 MED ORDER — ACETAMINOPHEN 325 MG PO TABS
650.0000 mg | ORAL_TABLET | ORAL | Status: DC | PRN
  Filled 2018-10-07: qty 2

## 2018-10-07 MED ORDER — SODIUM CHLORIDE 0.9% FLUSH: 3.0000 mL | Freq: Two times a day (BID) | INTRAVENOUS | Status: DC

## 2018-10-07 MED ORDER — BUPIVACAINE HCL (PF) 0.25 % IJ SOLN
INTRAMUSCULAR | Status: DC | PRN
  Administered 2018-10-07: 45 mL

## 2018-10-07 SURGICAL SUPPLY — 15 items
BAG SNAP BAND KOVER 36X36 (MISCELLANEOUS) ×2 IMPLANT
BLANKET WARM UNDERBOD FULL ACC (MISCELLANEOUS) ×2 IMPLANT
CATH JOSEPH QUAD ALLRED 6F REP (CATHETERS) ×2 IMPLANT
CATH SMTCH THERMOCOOL SF DF (CATHETERS) ×2 IMPLANT
CATH SOUNDSTAR ECO REPROCESSED (CATHETERS) ×2 IMPLANT
COVER SWIFTLINK CONNECTOR (BAG) ×2 IMPLANT
PACK EP LATEX FREE (CUSTOM PROCEDURE TRAY) ×3
PACK EP LF (CUSTOM PROCEDURE TRAY) ×1 IMPLANT
PAD PRO RADIOLUCENT 2001M-C (PAD) ×3 IMPLANT
PATCH CARTO3 (PAD) ×2 IMPLANT
SHEATH AVANTI 11F 11CM (SHEATH) ×2 IMPLANT
SHEATH PINNACLE 6F 10CM (SHEATH) ×2 IMPLANT
SHEATH PINNACLE 8F 10CM (SHEATH) ×4 IMPLANT
SHEATH PROBE COVER 6X72 (BAG) ×2 IMPLANT
TUBING SMART ABLATE COOLFLOW (TUBING) ×2 IMPLANT

## 2018-10-07 NOTE — H&P (Signed)
Logan Sanders has presented today for surgery, with the diagnosis of PVCs.  The various methods of treatment have been discussed with the patient and family. After consideration of risks, benefits and other options for treatment, the patient has consented to  Procedure(s): Catheter ablation as a surgical intervention .  Risks include but not limited to bleeding, tamponade, heart block, stroke, damage to surrounding organs, among others. The patient's history has been reviewed, patient examined, no change in status, stable for surgery.  I have reviewed the patient's chart and labs.  Questions were answered to the patient's satisfaction.    Logan Felmlee Curt Bears, MD 10/07/2018 7:10 AM

## 2018-10-07 NOTE — Anesthesia Postprocedure Evaluation (Signed)
Anesthesia Post Note  Patient: Logan Sanders  Procedure(s) Performed: PVC ABLATION (N/A )     Patient location during evaluation: PACU Anesthesia Type: MAC Level of consciousness: awake and alert Pain management: pain level controlled Vital Signs Assessment: post-procedure vital signs reviewed and stable Respiratory status: spontaneous breathing, nonlabored ventilation, respiratory function stable and patient connected to nasal cannula oxygen Cardiovascular status: stable and blood pressure returned to baseline Postop Assessment: no apparent nausea or vomiting Anesthetic complications: no    Last Vitals:  Vitals:   10/07/18 1326 10/07/18 1340  BP: (!) 158/57   Pulse: 64 65  Resp: 13 14  Temp:    SpO2: 98% 97%    Last Pain:  Vitals:   10/07/18 1440  TempSrc:   PainSc: 0-No pain                 Sumner Boesch DAVID

## 2018-10-07 NOTE — Transfer of Care (Signed)
Immediate Anesthesia Transfer of Care Note  Patient: Logan Sanders  Procedure(s) Performed: PVC ABLATION (N/A )  Patient Location: Cath Lab  Anesthesia Type:MAC  Level of Consciousness: awake  Airway & Oxygen Therapy: Patient Spontanous Breathing and Patient connected to face mask oxygen  Post-op Assessment: Report given to RN and Post -op Vital signs reviewed and stable  Post vital signs: Reviewed and stable  Last Vitals:  Vitals Value Taken Time  BP 139/66 10/07/18 1105  Temp    Pulse 53 10/07/18 1107  Resp 13 10/07/18 1107  SpO2 97 % 10/07/18 1107  Vitals shown include unvalidated device data.  Last Pain:  Vitals:   10/07/18 0615  TempSrc:   PainSc: 0-No pain         Complications: No apparent anesthesia complications

## 2018-10-07 NOTE — Progress Notes (Signed)
No bleeding or swelling after ambulation 

## 2018-10-07 NOTE — Anesthesia Procedure Notes (Signed)
Procedure Name: MAC Date/Time: 10/07/2018 8:00 AM Performed by: Lieutenant Diego, CRNA Pre-anesthesia Checklist: Patient identified, Emergency Drugs available, Suction available, Patient being monitored and Timeout performed Patient Re-evaluated:Patient Re-evaluated prior to induction Oxygen Delivery Method: Simple face mask Preoxygenation: Pre-oxygenation with 100% oxygen

## 2018-10-07 NOTE — Progress Notes (Signed)
Pt stable. Continues to have PVCs. Dr Curt Bears aware. (R) groin unremarkable.  Report given to Amy,RN who will assume care at this time.

## 2018-10-07 NOTE — Progress Notes (Signed)
Site area: Right groin a 6, 8, and 11 french venous sheath and a 8 fr arterial sheath was removed  Site Prior to Removal:  Level 0  Pressure Applied For 20 MINUTES     Bedrest Beginning at 1310p  Manual:   Yes.    Patient Status During Pull:  stable  Post Pull Groin Site:  Level 0  Post Pull Instructions Given:  Yes.    Post Pull Pulses Present:  Yes.    Dressing Applied:  Yes.    Comments:  VS remain stable

## 2018-10-07 NOTE — Anesthesia Preprocedure Evaluation (Addendum)
Anesthesia Evaluation  Patient identified by MRN, date of birth, ID band Patient awake    Reviewed: Allergy & Precautions, NPO status , Patient's Chart, lab work & pertinent test results  Airway Mallampati: II  TM Distance: >3 FB Neck ROM: Full    Dental   Pulmonary former smoker,    Pulmonary exam normal        Cardiovascular hypertension, Pt. on medications + CAD  Normal cardiovascular exam+ dysrhythmias Atrial Fibrillation      Neuro/Psych    GI/Hepatic   Endo/Other    Renal/GU      Musculoskeletal   Abdominal   Peds  Hematology   Anesthesia Other Findings   Reproductive/Obstetrics                             Anesthesia Physical Anesthesia Plan  ASA: III  Anesthesia Plan: MAC   Post-op Pain Management:    Induction: Intravenous  PONV Risk Score and Plan: 2 and Ondansetron and Treatment may vary due to age or medical condition  Airway Management Planned: Simple Face Mask  Additional Equipment:   Intra-op Plan:   Post-operative Plan:   Informed Consent: I have reviewed the patients History and Physical, chart, labs and discussed the procedure including the risks, benefits and alternatives for the proposed anesthesia with the patient or authorized representative who has indicated his/her understanding and acceptance.       Plan Discussed with: CRNA and Surgeon  Anesthesia Plan Comments:        Anesthesia Quick Evaluation

## 2018-10-07 NOTE — Discharge Instructions (Signed)

## 2018-10-07 NOTE — Telephone Encounter (Signed)
Patient called to find out when to restart Eliquis post ablation today.  Discussed with Dr. Curt Bears who will call patient to let him know when to restart

## 2018-10-08 ENCOUNTER — Encounter (HOSPITAL_COMMUNITY): Payer: Self-pay | Admitting: Cardiology

## 2018-10-08 NOTE — Telephone Encounter (Signed)
Pt advised to restart Eliquis tomorrow morning. Patient verbalized understanding and agreeable to plan.

## 2018-10-08 NOTE — Telephone Encounter (Signed)
Patient to restart eliquis tomorrow AM.

## 2018-10-17 ENCOUNTER — Ambulatory Visit: Payer: Medicare Other | Admitting: Cardiovascular Disease

## 2018-11-03 ENCOUNTER — Ambulatory Visit (INDEPENDENT_AMBULATORY_CARE_PROVIDER_SITE_OTHER): Payer: Medicare Other | Admitting: Cardiology

## 2018-11-03 ENCOUNTER — Encounter: Payer: Self-pay | Admitting: Cardiology

## 2018-11-03 ENCOUNTER — Other Ambulatory Visit: Payer: Self-pay

## 2018-11-03 VITALS — BP 124/60 | HR 64 | Ht 69.0 in | Wt 225.4 lb

## 2018-11-03 DIAGNOSIS — I493 Ventricular premature depolarization: Secondary | ICD-10-CM | POA: Diagnosis not present

## 2018-11-03 MED ORDER — SOTALOL HCL 80 MG PO TABS: 40.0000 mg | ORAL_TABLET | Freq: Two times a day (BID) | ORAL | 1 refills | Status: DC

## 2018-11-03 NOTE — Addendum Note (Signed)
Addended by: Stanton Kidney on: 11/03/2018 11:59 AM   Modules accepted: Orders

## 2018-11-03 NOTE — Progress Notes (Signed)
Electrophysiology Office Note   Date:  11/03/2018   ID:  Logan Sanders, DOB 11-02-1945, MRN [id number]  PCP:  Kelton Pillar, MD  Cardiologist:  Johnsie Cancel Primary Electrophysiologist:  Kyo Cocuzza [name], MD    No chief complaint on file.    History of Present Illness: Logan Sanders is a 73 y.o. male who is being seen today for the evaluation of PVCs at the request of Kelton Pillar, MD. Presenting today for electrophysiology evaluation.  He has a history of paroxysmal atrial fibrillation on Eliquis, coronary artery disease, hypertension, hyperlipidemia, hypothyroidism, and PVCs.    Today, denies symptoms of palpitations, chest pain, shortness of breath, orthopnea, PND, lower extremity edema, claudication, dizziness, presyncope, syncope, bleeding, or neurologic sequela. The patient is tolerating medications without difficulties.  He is continued to have episodes of weakness and fatigue.  He had an attempted PVC ablation, but PVCs were coming from very close to the RCA.   Past Medical History:  Diagnosis Date  . BPH (benign prostatic hyperplasia)   . CAD in native artery   . Complication of anesthesia    gets really anxious when waking up  . ED (erectile dysfunction)   . Hyperlipidemia   . Hypertension   . Hypothyroidism   . Osteoarthritis   . PVC's (premature ventricular contractions)    3-day cardiac monitor 07/2018: NSR, frequent PVCs (PVC burden 15.9%); rare PACs and rare episode of non-sustained SVT  //  Echo 08/2018: EF 55-60, mild LVH, normal RV SF, mild LAE, mild dilation of aortic root and ascending aorta (38 mm)   . Thyroid disease    Past Surgical History:  Procedure Laterality Date  . CARDIAC CATHETERIZATION  11/09/13   non obstructive disease in all vessels.  . COLONOSCOPY    . KNEE ARTHROSCOPY Left    x3  . LEFT HEART CATHETERIZATION WITH CORONARY ANGIOGRAM N/A 11/09/2013   Procedure: LEFT HEART CATHETERIZATION WITH CORONARY ANGIOGRAM;  Surgeon: Blane Ohara, MD;  Location: [facility] CATH LAB;  Service: Cardiovascular;  Laterality: N/A;  . LOWER BACK SURGERY  1985  . LUMBAR LAMINECTOMY/DECOMPRESSION MICRODISCECTOMY N/A 12/18/2017   Procedure: LAMINECTOMY AND FORAMINOTOMY LUMBAR TWO- LUMBAR THREE, LUMBAR THREE- LUMBAR FOUR, LUMBAR FOUR- LUMBAR FIVE;  Surgeon: Newman Pies, MD;  Location: [facility];  Service: Neurosurgery;  Laterality: N/A;  . NECK SURGERY  1999  . PVC ABLATION N/A 10/07/2018   Procedure: PVC ABLATION;  Surgeon: Constance Haw, MD;  Location: [facility] CV LAB;  Service: Cardiovascular;  Laterality: N/A;  . REPLACEMENT TOTAL KNEE  2005   LEFT KNEE  . WISDOM TOOTH EXTRACTION       Current Outpatient Medications  Medication Sig Dispense Refill  . albuterol (VENTOLIN HFA) 108 (90 Base) MCG/ACT inhaler Inhale 2 puffs into the lungs every 6 (six) hours as needed for wheezing or shortness of breath.     Marland Kitchen amLODipine (NORVASC) 5 MG tablet TAKE 1 TABLET ONCE DAILY. (Patient taking differently: Take 5 mg by mouth at bedtime. ) 30 tablet 10  . atorvastatin (LIPITOR) 40 MG tablet TAKE 1 TABLET BY MOUTH DAILY. (Patient taking differently: Take 40 mg by mouth at bedtime. ) 90 tablet 2  . carvedilol (COREG) 6.25 MG tablet Take 1 tablet (6.25 mg total) by mouth 2 (two) times daily. 180 tablet 3  . Cholecalciferol (D3-1000) 25 MCG (1000 UT) tablet Take 1,000 Units by mouth daily.    Marland Kitchen Co-Enzyme Q-10 100 MG CAPS Take 100 mg by mouth daily.     Marland Kitchen  doxazosin (CARDURA) 8 MG tablet Take 8 mg by mouth at bedtime.     Marland Kitchen ELIQUIS 5 MG TABS tablet TAKE 1 TABLET BY MOUTH TWICE DAILY. (Patient taking differently: Take 5 mg by mouth 2 (two) times daily. ) 60 tablet 5  . fexofenadine (ALLEGRA) 180 MG tablet Take 180 mg by mouth daily as needed for allergies.     . hydrochlorothiazide (HYDRODIURIL) 12.5 MG tablet Take 12.5 mg by mouth at bedtime.     . multivitamin-iron-minerals-folic acid (CENTRUM) chewable tablet Chew 1 tablet by mouth daily.    Marland Kitchen  olmesartan (BENICAR) 40 MG tablet Take 40 mg by mouth at bedtime.     . Omega-3 Fatty Acids (FISH OIL PO) Take 2,000 mg by mouth daily.     Marland Kitchen omeprazole (PRILOSEC) 40 MG capsule Take 40 mg by mouth daily as needed (for acid reflux).    . potassium chloride (K-DUR) 10 MEQ tablet Take 1 tablet (10 mEq total) by mouth daily. 30 tablet 11  . SYNTHROID 100 MCG tablet Take 100 mcg by mouth daily before breakfast.     . traZODone (DESYREL) 50 MG tablet Take 50 mg by mouth at bedtime.     Marland Kitchen zolpidem (AMBIEN) 10 MG tablet Take 10 mg by mouth at bedtime.      No current facility-administered medications for this visit.     Allergies:   Patient has no known allergies.   Social History:  The patient  reports that he quit smoking about 19 years ago. He quit smokeless tobacco use about 23 years ago. He reports current alcohol use of about 1.0 standard drinks of alcohol per week. He reports that he does not use drugs.   Family History:  The patient's family history includes Asthma in his mother; Heart attack in his father; Heart disease in his father; Hyperlipidemia in his brother; Parkinsonism in his mother; Prostate cancer in his father.   ROS:  Please see the history of present illness.   Otherwise, review of systems is positive for none.   All other systems are reviewed and negative.   PHYSICAL EXAM: VS:  BP 124/60   Pulse 64   Ht 5\' 9"  (1.753 m)   Wt 225 lb 6.4 oz (102.2 kg)   SpO2 95%   BMI 33.29 kg/m  , BMI Body mass index is 33.29 kg/m. GEN: Well nourished, well developed, in no acute distress  HEENT: normal  Neck: no JVD, carotid bruits, or masses Cardiac: iRRR; no murmurs, rubs, or gallops,no edema  Respiratory:  clear to auscultation bilaterally, normal work of breathing GI: soft, nontender, nondistended, + BS MS: no deformity or atrophy  Skin: warm and dry Neuro:  Strength and sensation are intact Psych: euthymic mood, full affect  EKG:  EKG is ordered today. Personal review of  the ekg ordered shows SR, PVCs   Recent Labs: 06/13/2018: TSH 1.840 08/04/2018: Magnesium 2.1 09/30/2018: BUN 16; Creatinine, Ser 1.28; Hemoglobin 14.1; Platelets 198; Potassium 3.9; Sodium 137    Lipid Panel     Component Value Date/Time   CHOL 113 07/19/2017 0755   TRIG 68 07/19/2017 0755   HDL 38 (L) 07/19/2017 0755   CHOLHDL 3.0 07/19/2017 0755   CHOLHDL 2.6 07/28/2015 1042   VLDL 17 07/28/2015 1042   LDLCALC 61 07/19/2017 0755     Wt Readings from Last 3 Encounters:  11/03/18 225 lb 6.4 oz (102.2 kg)  10/07/18 218 lb (98.9 kg)  09/30/18 220 lb (99.8 kg)  Other studies Reviewed: Additional studies/ records that were reviewed today include: TTE 08/04/18  Review of the above records today demonstrates:   1. The left ventricle has normal systolic function, with an ejection fraction of 55-60%. The cavity size was normal. There is mildly increased left ventricular wall thickness. Left ventricular diastolic parameters were normal.  2. The right ventricle has normal systolic function. The cavity was normal.  3. Left atrial size was mildly dilated.  4. The mitral valve is abnormal. Mild thickening of the mitral valve leaflet.  5. The tricuspid valve is grossly normal.  6. The aortic valve is tricuspid. Mild thickening of the aortic valve. No stenosis of the aortic valve.  7. The aorta is abnormal in size and structure.  8. There is mild dilatation of the aortic root and of the ascending aorta measuring 38 mm.  9. Normal LV function; mild LVH; mild LAE.    ASSESSMENT AND PLAN:  1.  PVCs: 15% burden.  Is status post ablation.  Unfortunately PVCs were coming from adjacent to the RCA.  Continue to have PVCs.  He was previously on mexiletine which he did not tolerate.  We Aidon Klemens thus plan to start him on sotalol 40 mg.  We Dawnielle Christiana have him come back in a few days for an ECG.  2.  Paroxysmal atrial fibrillation: Currently on Eliquis.  This patients CHA2DS2-VASc Score and unadjusted  Ischemic Stroke Rate (% per year) is equal to 3.2 % stroke rate/year from a score of 3  Above score calculated as 1 point each if present [CHF, HTN, DM, Vascular=MI/PAD/Aortic Plaque, Age if 65-74, or Male] Above score calculated as 2 points each if present [Age > 75, or Stroke/TIA/TE]   3.  Hypertension: Currently well controlled  4.  Coronary artery disease: Nonobstructive by cath in 2015.  Currently on aspirin and Eliquis.    Current medicines are reviewed at length with the patient today.   The patient does not have concerns regarding his medicines.  The following changes were made today: Start sotalol  Labs/ tests ordered today include:  Orders Placed This Encounter  Procedures  . EKG 12-Lead   Disposition:   FU with Marionette Meskill 3 months  Signed, Emmah Bratcher [name], MD  11/03/2018 9:12 AM     [facility] [address] [place] [address] [phone] (office) [phone] (fax)

## 2018-11-03 NOTE — Patient Instructions (Addendum)
Medication Instructions:  Your physician has recommended you make the following change in your medication:  1. START Sotalol 40 mg twice a day 2. STOP Carvedilol  * If you need a refill on your cardiac medications before your next appointment, please call your pharmacy.   Labwork: None ordered  Testing/Procedures: None ordered  Follow-Up: You are scheduled for nurse visit EKG on Wednesday 11/4 @ 2:00 pm   You are scheduled for follow up with Dr. Curt Bears on 02/09/2019 @ 9:30 am   Thank you for choosing [facility]!!   Trinidad Curet, RN [phone]  Any Other Special Instructions Will Be Listed Below (If Applicable).   Sotalol tablets (Betapace AF) What is this medicine? SOTALOL (SOE ta lole) is a beta-blocker. Beta-blockers reduce the workload on the heart and help it to beat more regularly. This medicine is used to treat patients with an atrial heart arrhythmia such as atrial fibrillation. This medicine can help your heart return to and maintain a normal rhythm. This medicine may be used for other purposes; ask your health care provider or pharmacist if you have questions. COMMON BRAND NAME(S): BETAPACE AF What should I tell my health care provider before I take this medicine? They need to know if you have any of these conditions:  diabetes  heart or vessel disease like slow heart rate, worsening heart failure, heart block, sick sinus syndrome or Raynaud's disease  history of low levels of potassium or magnesium  kidney disease  liver disease  lung or breathing disease, like asthma or emphysema  pheochromocytoma  recent heart attack  thyroid disease  an unusual or allergic reaction to sotalol, other beta-blockers, medicines, foods, dyes, or preservatives  pregnant or trying to get pregnant  breast-feeding How should I use this medicine? Take this medicine by mouth with a glass of water. Follow the directions on the prescription label. Take your doses at  regular intervals. Do not take your medicine more often than directed. Do not stop taking this medicine suddenly. This could lead to serious heart-related effects. Talk to your pediatrician regarding the use of this medicine in children. Special care may be needed. While this medicine may be used in children for selected conditions precautions do apply. Overdosage: If you think you have taken too much of this medicine contact a poison control center or emergency room at once. NOTE: This medicine is only for you. Do not share this medicine with others. What if I miss a dose? If you miss a dose, take it as soon as you can. If it is almost time for your next dose, take only that dose. Do not take double or extra doses. What may interact with this medicine? Do not take this medicine with any of the following medications:  amoxapine  arsenic trioxide  certain antibiotics like gatifloxacin, grepafloxacin, levofloxacin, moxifloxacin, sparfloxacin, telithromycin  cisapride  droperidol  haloperidol  hawthorn  maprotiline  medicines for malaria like chloroquine and halofantrine  medicines to control heart rhythm  methadone  pentamidine  phenothiazines like prochlorperazine, perphenazine, thioridazine, and others  pimozide  ranolazine  tricyclic antidepressants like amitriptyline, imipramine, nortriptyline, and others  vardenafil This medicine may also interact with the following medications:  antacids  certain antibiotics such as clarithromycin and erythromycin  clonidine  digoxin  medicines for angina or high blood pressure  medicines for colds and breathing difficulties  medicines for diabetes  other beta-blockers like atenolol, metoprolol, propranolol and others  ziprasidone This list may not describe all possible  interactions. Give your health care provider a list of all the medicines, herbs, non-prescription drugs, or dietary supplements you use. Also tell them  if you smoke, drink alcohol, or use illegal drugs. Some items may interact with your medicine. What should I watch for while using this medicine? You will be started on this medicine in a specialized facility for the first two or more days of treatment. Visit your doctor or health care professional for regular checks on your progress. Check your heart rate and blood pressure regularly while you are taking this medicine. Ask your doctor or health care professional what your heart rate and blood pressure should be, and when you should contact him or her. Your doctor or health care professional also may schedule regular blood tests and electrocardiograms to check your progress. Because your condition and the use of this medicine carry some risk, it is a good idea to carry an identification card, necklace or bracelet with details of your condition, medications, and doctor or health care professional. Dennis Bast may get drowsy or dizzy. Do not drive, use machinery, or do anything that needs mental alertness until you know how this drug affects you. Do not stand or sit up quickly, especially if you are an older patient. This reduces the risk of dizzy or fainting spells. Alcohol can make you more drowsy and dizzy. Avoid alcoholic drinks. Do not treat yourself for coughs, colds, or pain while you are taking this medicine without asking your doctor or health care professional for advice. Some ingredients may increase your blood pressure. If you are going to have surgery, tell your doctor or health care professional that you are taking this medicine. What side effects may I notice from receiving this medicine? Side effects that you should report to your doctor or health care professional as soon as possible:  allergic reactions like skin rash, itching or hives, swelling of the face, lips, or tongue  chest pain  cold, tingling, or numb hands or feet  confusion  diarrhea  difficulty breathing, wheezing  irregular  heartbeat  muscle aches and pains  slow heart rate  sweating  swollen legs or ankles  tremor, shakes  vomiting Side effects that usually do not require medical attention (report to your doctor or health care professional if they continue or are bothersome):  change in sex drive or performance  mental depression  nausea  weakness or tiredness This list may not describe all possible side effects. Call your doctor for medical advice about side effects. You may report side effects to FDA at 1-800-FDA-1088. Where should I keep my medicine? Keep out of the reach of children. Store at room temperature between 15 and 30 degrees C (59 and 86 degrees F). Throw away any unused medicine after the expiration date. NOTE: This sheet is a summary. It may not cover all possible information. If you have questions about this medicine, talk to your doctor, pharmacist, or health care provider.  2020 Elsevier/Gold Standard (2017-12-10 11:01:52)

## 2018-11-05 ENCOUNTER — Other Ambulatory Visit: Payer: Self-pay | Admitting: Cardiovascular Disease

## 2018-11-05 ENCOUNTER — Other Ambulatory Visit: Payer: Self-pay

## 2018-11-05 ENCOUNTER — Ambulatory Visit (INDEPENDENT_AMBULATORY_CARE_PROVIDER_SITE_OTHER): Payer: Medicare Other | Admitting: *Deleted

## 2018-11-05 VITALS — HR 66 | Wt 220.8 lb

## 2018-11-05 DIAGNOSIS — Z79899 Other long term (current) drug therapy: Secondary | ICD-10-CM

## 2018-11-05 DIAGNOSIS — I493 Ventricular premature depolarization: Secondary | ICD-10-CM

## 2018-11-05 MED ORDER — SOTALOL HCL 80 MG PO TABS: 80.0000 mg | ORAL_TABLET | Freq: Two times a day (BID) | ORAL | 1 refills | Status: DC

## 2018-11-05 NOTE — Progress Notes (Signed)
1.  Reason for visit: EKG post Sotalol start (low dose)  2.  Name of MD requesting visit:  Camnitz  3. H&P:  See epic  4.  ROS related to problem:  See epic  5.  Assessment and plan per MD:   EKG performed showing SR w/ frequent PVCs/bigeminy, HR 66. Reviewed by Lovena Le, no orders received. Reviewed w/ Camnitz, order to increase Sotalol to 80 mg BID w/ follow up nurse visit EKG.   Pt agreeable to increase, f/u EKG scheduled for 11/16. Pt agreeable to plan and will call office if issues arise after medication increase.

## 2018-11-06 NOTE — Addendum Note (Signed)
Addended by: Stanton Kidney on: 11/06/2018 12:54 PM   Modules accepted: Orders

## 2018-11-10 ENCOUNTER — Ambulatory Visit: Payer: Medicare Other | Admitting: Cardiovascular Disease

## 2018-11-17 ENCOUNTER — Telehealth: Payer: Self-pay | Admitting: Cardiology

## 2018-11-17 ENCOUNTER — Ambulatory Visit: Payer: Medicare Other

## 2018-11-17 MED ORDER — SOTALOL HCL 80 MG PO TABS: 80.0000 mg | ORAL_TABLET | Freq: Two times a day (BID) | ORAL | 11 refills | Status: DC

## 2018-11-17 NOTE — Telephone Encounter (Signed)
Pt's medication was sent to pt's pharmacy as requested. Confirmation received.  °

## 2018-11-17 NOTE — Telephone Encounter (Signed)
°*  STAT* If patient is at the pharmacy, call can be transferred to refill team.   1. Which medications need to be refilled? (please list name of each medication and dose if known) sotalol (BETAPACE) 80 MG tablet  2. Which pharmacy/location (including street and city if local pharmacy) is medication to be sent to? [place], [place]  3. Do they need a 30 day or 90 day supply? [place]

## 2018-11-17 NOTE — Telephone Encounter (Signed)
Pt rescheduled for this Friday.  He will call me if symptoms persist then.

## 2018-11-17 NOTE — Telephone Encounter (Signed)
Patient has cancelled his appt today with the nurse at 8:45am. He has a cough and states he just does not feel good at all. He want's to know if Sherri needs him to reschedule that appt or not in order to refill his Sotalol, please advise. He has enough medication to last today and tomorrow but I sent in a refill request to the refill team.

## 2018-11-21 ENCOUNTER — Ambulatory Visit (INDEPENDENT_AMBULATORY_CARE_PROVIDER_SITE_OTHER): Payer: Medicare Other | Admitting: *Deleted

## 2018-11-21 ENCOUNTER — Other Ambulatory Visit: Payer: Self-pay

## 2018-11-21 DIAGNOSIS — Z79899 Other long term (current) drug therapy: Secondary | ICD-10-CM

## 2018-11-21 DIAGNOSIS — I480 Paroxysmal atrial fibrillation: Secondary | ICD-10-CM

## 2018-11-21 NOTE — Patient Instructions (Signed)
Medication Instructions:  Your physician recommends that you continue on your current medications as directed. Please refer to the Current Medication list given to you today.  * If you need a refill on your cardiac medications before your next appointment, please call your pharmacy.   Labwork: None ordered  Testing/Procedures: None ordered  Follow-Up: Keep your scheduled follow up in February with Dr. Curt Bears.   Thank you for choosing [facility]!!   Trinidad Curet, RN [phone]

## 2018-11-21 NOTE — Progress Notes (Signed)
1.  Reason for visit: EKG/Sotalol titration  2.  Name of MD requesting visit:  Camnitz  3. H&P:  See epic  4.  ROS related to problem:  See epic  5.  Assessment and plan per MD:   EKG, Sinus Bradycardia HR 56, PR 144ms, QRS 38ms. No orders received, will remain at Sotalol 80 mg BID.  Advised to call the office if he begins to experience SE from AFib and/or Sotalol is not controlling rhythm.

## 2018-11-25 ENCOUNTER — Ambulatory Visit: Payer: Medicare Other | Admitting: Cardiovascular Disease

## 2018-11-25 ENCOUNTER — Other Ambulatory Visit: Payer: Self-pay

## 2018-11-25 ENCOUNTER — Encounter: Payer: Self-pay | Admitting: Cardiovascular Disease

## 2018-11-25 VITALS — BP 150/72 | HR 57 | Ht 70.0 in | Wt 218.4 lb

## 2018-11-25 DIAGNOSIS — I493 Ventricular premature depolarization: Secondary | ICD-10-CM

## 2018-11-25 DIAGNOSIS — E7849 Other hyperlipidemia: Secondary | ICD-10-CM | POA: Diagnosis not present

## 2018-11-25 NOTE — Patient Instructions (Signed)
Medication Instructions:  Your physician recommends that you continue on your current medications as directed. Please refer to the Current Medication list given to you today.  *If you need a refill on your cardiac medications before your next appointment, please call your pharmacy*  Lab Work: None Ordered   Testing/Procedures: None Ordered   Follow-Up: At [facility], you and your health needs are our priority.  As part of our continuing mission to provide you with exceptional heart care, we have created designated Provider Care Teams.  These Care Teams include your primary Cardiologist (physician) and Advanced Practice Providers (APPs -  Physician Assistants and Nurse Practitioners) who all work together to provide you with the care you need, when you need it.  Your next appointment:   1 year(s)  The format for your next appointment:   In Person  Provider:   You may see Mertie Moores, MD or one of the following Advanced Practice Providers on your designated Care Team:    Richardson Dopp, PA-C  [place], [place]  Daune Perch, [place]

## 2018-11-25 NOTE — Progress Notes (Signed)
[name] Date of Birth  Jun 17, 1945       [name] [address], [address], [place], [place]  [id number]   [place], [place]  [address] [place]   Fax  [phone]     Fax [phone]  Problem List: 1. Hypertension 2. Hyperlipidemia 3. Hypothyroidism 4. CAD -   Moderate CAD by cath 11/09/2013 5. Paroxsymal atrial fib:     [name] is a 73 old gentleman with a history of hypertension, hyperlipidemia and a positive family history of cardiac disease. He was referred for possible stress testing given his risk factors.  He has not had any symptoms of CP or dyspnea.  He worked for the [employer] for 33 years - retired 2005.  Currently, he spends his time riding his [place] , plays golf, hunts deer, [place], squirrel, .  Walks about a mile each day.  Does all this without any CP or dyspnea.  Lifts light weights and uses resistance bands.    Grandfather died at age 6, father died at age 64. Uncle died in his 92's   11/13/2013:  Kerrion presents today for followup. We performed a coronary calcium score which revealed significant calcification: Dense calcification distal LM Scattered calcification of mid and distal LAD and mid and distal RCA  Subsequent stress myoview was abnormal Overall Impression: Intermediate risk stress nuclear study with mild lateral wall ischemia and mildly depressed LV systolic function.  LV Ejection Fraction: 47%. LV Wall Motion: NL LV Function; NL Wall Motion  Jan. 13, 2016: [name] is a 73 yo with an abnormal coronary calcium score - significant calcification: Dense calcification distal LM Scattered calcification of mid and distal LAD and mid and distal RCA Myoview was abnormal and he was referred for cath.   LM  moderate calcification. The vessel has 20-30% stenosis in its midportion. The ostium of the left main is widely patent. The distal left main is  widely patent.    (LAD): the LAD is medium in caliber. The vessel reaches the LV apex. The proximal vessel has diffuse irregularity with mild calcification.  D1 has 30-40% stenosis at its ostium. The mid LAD at that bifurcation also has 30-40% stenosis. The remaining portions of the LAD have mild nonobstructive disease. Left circumflex (LCx): the left circumflex is patent. The mid vessel has 40% stenosis. There is a tiny obtuse marginal branch that has 50% stenosis. The major OM branch is widely patent with minor nonobstructive disease. Right coronary artery (RCA): this is a dominant vessel. There is diffuse calcification and irregularity but there are no high-grade stenoses present. There is no more than about 20 or 30% stenosis which is most significant at the junction of the mid and distal RCA. The PDA and PLA branches are patent. Left ventriculography: Left ventricular systolic function is normal, LVEF is estimated at 55%  Feeling well.  No CP . The cath showed mild - moderate irregularities. He has not had any further issues  July 19, 2014:   [name] is doing well.  No dizziness, no syncope  Has some ringing in his ears.  Takes his BP at home, readings are in the normal range.   Jan. 23, 2017: [name] is seen back for a follow up visit. Walks on occasion .  Not as much exercise as in the summer . No angina . No dyspnea.  BP  is well controlled.   Sept. 6, 2017:  Sarang is seen back for follow up of newly diagnosed atrial fib.  The afib was diagnosed when he came for his echo - was seen incidentally on the echo. Has been started on Eliquis  No bleeding  Still exercising . Has been watching his salt .   March 01, 2016:    Seen back for his mild CAD and Paroxysmal atrial fib  Cannot tell when he is in Atrial fib. Has some indigestion.  Belches and feels better  Not exercising much.  Weather has been challenging  No CP or dyspnea doing his regular activities.   Sept. 27, 2018:    Muhanad is seen back for follow-up of his mild to moderate coronary artery disease and his paroxysmal atrial fibrillation. Has had some ankle swelling.   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  Is also on amlodipine ( which is contributing to the edema )   July 19, 2017: Mattix is seen today for follow-up of his mild coronary artery disease, paroxysmal atrial fib .  He pulled a muscle in his back and is requesting some muscle relaxers.  December 04, 2017:  Dino is seen today for follow-up of his moderate coronary artery disease and paroxysmal atrial fibrillation.  He was recently seen at Dr. Dellis Filbert office.  He was found to have a blood pressure with a a moderately high systolic reading in a very low diastolic reading. Is having frequent PVCs  SPECT that his premature ventricular contractions contributed to his very low diastolic blood pressure reading.  He is fairly active.  He is limited by his hip pain.  Is scheduled to have back surgery on Dec. 18, 2019 hes not having any CP . He is at low risk for this surgery .   Also has had some hemorrhoid issues Needs to have them banded and needs to hold Eliquis for 3 days with that. I've given him the OK for that procedure as well.     July 18, 2018   Is under lots of stress,  duaghter was just dx'd with breast cancer   A myoview was ordered for his increased palpitatinos. And a PVC burden of 15.9%.    Saw Camnitz for his large PVC burden.   Was stared on Mexilitine 150 mg PO BID .  At times he thinks that the mexiletine has helped with the palpitations but at other times the palpitations seem to be just as bad.   EF was noted to be llow - echo has been ordered to verifty his EF   Echocardiogram performed December, 2019 revealed normal left ventricular systolic function.  He had mild diastolic dysfunction.  Nov. 24, 2020  Dayln is  seen today for follow-up of his premature ventricular contractions.  He  saw Dr. Curt Bears several weeks ago.   He did a PVC ablation but it did not work completely .     He was started on sotalol 40 mg a day  instead of mexiletine.  Dose was incrased to 80.  Coreg has been stopped Feeling well.   Has lost some weight  BP is a little elevated.   He says this is unusual .   Current Outpatient Medications  Medication Sig Dispense Refill  . albuterol (VENTOLIN HFA) 108 (90 Base) MCG/ACT inhaler Inhale 2 puffs into the lungs every 6 (six) hours as needed for wheezing or shortness of breath.     Marland Kitchen  amLODipine (NORVASC) 5 MG tablet TAKE 1 TABLET ONCE DAILY. 30 tablet 10  . atorvastatin (LIPITOR) 40 MG tablet Take 1 tablet (40 mg total) by mouth at bedtime. 90 tablet 3  . Cholecalciferol (D3-1000) 25 MCG (1000 UT) tablet Take 1,000 Units by mouth daily.    Marland Kitchen Co-Enzyme Q-10 100 MG CAPS Take 100 mg by mouth daily.     Marland Kitchen doxazosin (CARDURA) 8 MG tablet Take 8 mg by mouth at bedtime.     Marland Kitchen ELIQUIS 5 MG TABS tablet TAKE 1 TABLET BY MOUTH TWICE DAILY. 60 tablet 5  . fexofenadine (ALLEGRA) 180 MG tablet Take 180 mg by mouth daily as needed for allergies.     . hydrochlorothiazide (HYDRODIURIL) 12.5 MG tablet Take 12.5 mg by mouth at bedtime.     . multivitamin-iron-minerals-folic acid (CENTRUM) chewable tablet Chew 1 tablet by mouth daily.    Marland Kitchen olmesartan (BENICAR) 40 MG tablet Take 40 mg by mouth at bedtime.     . Omega-3 Fatty Acids (FISH OIL PO) Take 2,000 mg by mouth daily.     Marland Kitchen omeprazole (PRILOSEC) 40 MG capsule Take 40 mg by mouth daily as needed (for acid reflux).    . potassium chloride (K-DUR) 10 MEQ tablet Take 1 tablet (10 mEq total) by mouth daily. 30 tablet 11  . sotalol (BETAPACE) 80 MG tablet Take 1 tablet (80 mg total) by mouth 2 (two) times daily. 60 tablet 11  . SYNTHROID 100 MCG tablet Take 100 mcg by mouth daily before breakfast.     . traZODone (DESYREL) 50 MG tablet Take 50 mg by mouth at bedtime.     Marland Kitchen zolpidem (AMBIEN) 10 MG tablet Take 10 mg by mouth at  bedtime.      No current facility-administered medications for this visit.      No Known Allergies  Past Medical History:  Diagnosis Date  . BPH (benign prostatic hyperplasia)   . CAD in native artery   . Complication of anesthesia    gets really anxious when waking up  . ED (erectile dysfunction)   . Hyperlipidemia   . Hypertension   . Hypothyroidism   . Osteoarthritis   . PVC's (premature ventricular contractions)    3-day cardiac monitor 07/2018: NSR, frequent PVCs (PVC burden 15.9%); rare PACs and rare episode of non-sustained SVT  //  Echo 08/2018: EF 55-60, mild LVH, normal RV SF, mild LAE, mild dilation of aortic root and ascending aorta (38 mm)   . Thyroid disease     Past Surgical History:  Procedure Laterality Date  . CARDIAC CATHETERIZATION  11/09/13   non obstructive disease in all vessels.  . COLONOSCOPY    . KNEE ARTHROSCOPY Left    x3  . LEFT HEART CATHETERIZATION WITH CORONARY ANGIOGRAM N/A 11/09/2013   Procedure: LEFT HEART CATHETERIZATION WITH CORONARY ANGIOGRAM;  Surgeon: Blane Ohara, MD;  Location: [facility] CATH LAB;  Service: Cardiovascular;  Laterality: N/A;  . LOWER BACK SURGERY  1985  . LUMBAR LAMINECTOMY/DECOMPRESSION MICRODISCECTOMY N/A 12/18/2017   Procedure: LAMINECTOMY AND FORAMINOTOMY LUMBAR TWO- LUMBAR THREE, LUMBAR THREE- LUMBAR FOUR, LUMBAR FOUR- LUMBAR FIVE;  Surgeon: Newman Pies, MD;  Location: [facility];  Service: Neurosurgery;  Laterality: N/A;  . NECK SURGERY  1999  . PVC ABLATION N/A 10/07/2018   Procedure: PVC ABLATION;  Surgeon: Constance Haw, MD;  Location: [facility] CV LAB;  Service: Cardiovascular;  Laterality: N/A;  . REPLACEMENT TOTAL KNEE  2005   LEFT KNEE  .  WISDOM TOOTH EXTRACTION      Social History   Tobacco Use  Smoking Status Former Smoker  . Quit date: 05/06/1999  . Years since quitting: 19.5  Smokeless Tobacco Former Systems developer  . Quit date: 07/28/1995    Social History   Substance and Sexual Activity  Alcohol  Use Yes  . Alcohol/week: 1.0 standard drinks  . Types: 1 Cans of beer per week   Comment: occasionally    Family History  Problem Relation Age of Onset  . Asthma Mother   . Parkinsonism Mother   . Heart disease Father   . Heart attack Father   . Prostate cancer Father   . Hyperlipidemia Brother     Reviw of Systems:  Reviewed in the HPI.  All other systems are negative.   Physical Exam: Blood pressure (!) 150/72, pulse (!) 57, height 5\' 10"  (1.778 m), weight 218 lb 6.4 oz (99.1 kg), SpO2 97 %.  GEN:  Well nourished, well developed in no acute distress HEENT: Normal NECK: No JVD; No carotid bruits LYMPHATICS: No lymphadenopathy CARDIAC: RRR , no murmurs, rubs, gallops RESPIRATORY:  Clear to auscultation without rales, wheezing or rhonchi  ABDOMEN: Soft, non-tender, non-distended MUSCULOSKELETAL:  No edema; No deformity  SKIN: Warm and dry NEUROLOGIC:  Alert and oriented x 3   ECG:     .  Assessment / Plan:   1. Hypertension - .  Blood pressure has been fairly well controlled.  Continue current medications.  2.  Frequent premature ventricular contractions: He had a PVC burden of 15.9%.  He had a PVC ablation by Dr. Hal Hope.  This was only partially successful.  He then was started on sotalol.  He seems to be doing very well with sotalol and the PVCs have largely resolved.  He will continue to follow-up with EP.  I will see him again in 1 year.   3. CAD -    Doing well.  No  Angina   4.  Hyperlipidemia:   Stable     Mertie Moores, MD  11/25/2018 3:43 PM    [address],  [place], [place]  [address] Pager [phone] Phone: [phone]; Fax: [phone]

## 2019-01-18 ENCOUNTER — Other Ambulatory Visit: Payer: Self-pay | Admitting: Cardiology

## 2019-01-18 DIAGNOSIS — I10 Essential (primary) hypertension: Secondary | ICD-10-CM

## 2019-01-18 DIAGNOSIS — I2510 Atherosclerotic heart disease of native coronary artery without angina pectoris: Secondary | ICD-10-CM

## 2019-01-19 NOTE — Telephone Encounter (Signed)
Eliquis 5mg  refill request received, pt is 74 yrs old, weight-99.1kg, Crea-1.28 on 09/29/2018, Diagnosis-Afib, and last seen by Dr. Acie Fredrickson on 11/25/2018. Dose is appropriate based on dosing criteria. Will send in refill to requested pharmacy.

## 2019-02-09 ENCOUNTER — Encounter: Payer: Self-pay | Admitting: Cardiology

## 2019-02-09 ENCOUNTER — Ambulatory Visit: Payer: Medicare Other | Admitting: Cardiology

## 2019-02-09 ENCOUNTER — Other Ambulatory Visit: Payer: Self-pay

## 2019-02-09 VITALS — BP 134/77 | HR 55 | Ht 70.0 in | Wt 224.0 lb

## 2019-02-09 DIAGNOSIS — Z79899 Other long term (current) drug therapy: Secondary | ICD-10-CM | POA: Diagnosis not present

## 2019-02-09 DIAGNOSIS — I493 Ventricular premature depolarization: Secondary | ICD-10-CM | POA: Diagnosis not present

## 2019-02-09 LAB — BASIC METABOLIC PANEL
BUN/Creatinine Ratio: 14 (ref 10–24)
BUN: 18 mg/dL (ref 8–27)
CO2: 19 mmol/L — ABNORMAL LOW (ref 20–29)
Calcium: 9.2 mg/dL (ref 8.6–10.2)
Chloride: 102 mmol/L (ref 96–106)
Creatinine, Ser: 1.28 mg/dL — ABNORMAL HIGH (ref 0.76–1.27)
GFR calc Af Amer: 64 mL/min/{1.73_m2} (ref >59)
GFR calc non Af Amer: 56 mL/min/{1.73_m2} — ABNORMAL LOW (ref >59)
Glucose: 96 mg/dL (ref 65–99)
Potassium: 4 mmol/L (ref 3.5–5.2)
Sodium: 140 mmol/L (ref 134–144)

## 2019-02-09 LAB — MAGNESIUM: Magnesium: 2.1 mg/dL (ref 1.6–2.3)

## 2019-02-09 NOTE — Progress Notes (Signed)
Electrophysiology Office Note   Date:  02/09/2019   ID:  Logan Sanders, DOB [birth year], MRN [id number]  PCP:  Logan Sanders, Logan Sanders  Cardiologist:  Logan Sanders Primary Electrophysiologist:  Logan Sanders, Logan Sanders    No chief complaint on file.    History of Present Illness: Logan Sanders is a 74 y.o. male who is being seen today for the evaluation of PVCs at the request of Logan Sanders, Logan Sanders. Presenting today for electrophysiology evaluation.  He has a history of paroxysmal atrial fibrillation on Eliquis, coronary artery disease, hypertension, hyperlipidemia, hypothyroidism, and PVCs.  Unfortunately PVCs were coming from close proximity to the RCA.  This was not ablated and he was started on sotalol.  Today, denies symptoms of palpitations, chest pain, shortness of breath, orthopnea, PND, lower extremity edema, claudication, dizziness, presyncope, syncope, bleeding, or neurologic sequela. The patient is tolerating medications without difficulties.  Overall he is doing well.  Since his sotalol load he has had no further episodes of weakness and fatigue.  He continues to exercise without issue.  Past Medical History:  Diagnosis Date  . BPH (benign prostatic hyperplasia)   . CAD in native artery   . Complication of anesthesia    gets really anxious when waking up  . ED (erectile dysfunction)   . Hyperlipidemia   . Hypertension   . Hypothyroidism   . Osteoarthritis   . PVC's (premature ventricular contractions)    3-day cardiac monitor 07/2018: NSR, frequent PVCs (PVC burden 15.9%); rare PACs and rare episode of non-sustained SVT  //  Echo 08/2018: EF 55-60, mild LVH, normal RV SF, mild LAE, mild dilation of aortic root and ascending aorta (38 mm)   . Thyroid disease    Past Surgical History:  Procedure Laterality Date  . CARDIAC CATHETERIZATION  11/09/13   non obstructive disease in all vessels.  . COLONOSCOPY    . KNEE ARTHROSCOPY Left    x3  . LEFT HEART CATHETERIZATION WITH  CORONARY ANGIOGRAM N/A 11/09/2013   Procedure: LEFT HEART CATHETERIZATION WITH CORONARY ANGIOGRAM;  Surgeon: Blane Ohara, Logan Sanders;  Location: [facility] CATH LAB;  Service: Cardiovascular;  Laterality: N/A;  . LOWER BACK SURGERY  1985  . LUMBAR LAMINECTOMY/DECOMPRESSION MICRODISCECTOMY N/A 12/18/2017   Procedure: LAMINECTOMY AND FORAMINOTOMY LUMBAR TWO- LUMBAR THREE, LUMBAR THREE- LUMBAR FOUR, LUMBAR FOUR- LUMBAR FIVE;  Surgeon: Newman Pies, Logan Sanders;  Location: [facility];  Service: Neurosurgery;  Laterality: N/A;  . NECK SURGERY  1999  . PVC ABLATION N/A 10/07/2018   Procedure: PVC ABLATION;  Surgeon: Constance Haw, Logan Sanders;  Location: [facility] CV LAB;  Service: Cardiovascular;  Laterality: N/A;  . REPLACEMENT TOTAL KNEE  2005   LEFT KNEE  . WISDOM TOOTH EXTRACTION       Current Outpatient Medications  Medication Sig Dispense Refill  . albuterol (VENTOLIN HFA) 108 (90 Base) MCG/ACT inhaler Inhale 2 puffs into the lungs every 6 (six) hours as needed for wheezing or shortness of breath.     Marland Kitchen amLODipine (NORVASC) 5 MG tablet TAKE 1 TABLET ONCE DAILY. 30 tablet 10  . atorvastatin (LIPITOR) 40 MG tablet Take 1 tablet (40 mg total) by mouth at bedtime. 90 tablet 3  . Cholecalciferol (D3-1000) 25 MCG (1000 UT) tablet Take 1,000 Units by mouth daily.    Marland Kitchen Co-Enzyme Q-10 100 MG CAPS Take 100 mg by mouth daily.     . Cyanocobalamin (VITAMIN B-12 PO) Take 1 tablet by mouth daily.    Marland Kitchen doxazosin (CARDURA) 8  MG tablet Take 8 mg by mouth at bedtime.     Marland Kitchen ELIQUIS 5 MG TABS tablet TAKE 1 TABLET BY MOUTH TWICE DAILY. 60 tablet 10  . fexofenadine (ALLEGRA) 180 MG tablet Take 180 mg by mouth daily as needed for allergies.     . hydrochlorothiazide (HYDRODIURIL) 12.5 MG tablet Take 12.5 mg by mouth at bedtime.     . multivitamin-iron-minerals-folic acid (CENTRUM) chewable tablet Chew 1 tablet by mouth daily.    Marland Kitchen olmesartan (BENICAR) 40 MG tablet Take 40 mg by mouth at bedtime.     . Omega-3 Fatty Acids (FISH OIL  PO) Take 2,000 mg by mouth daily.     Marland Kitchen omeprazole (PRILOSEC) 40 MG capsule Take 40 mg by mouth daily as needed (for acid reflux).    . potassium chloride (K-DUR) 10 MEQ tablet Take 1 tablet (10 mEq total) by mouth daily. 30 tablet 11  . sotalol (BETAPACE) 80 MG tablet Take 1 tablet (80 mg total) by mouth 2 (two) times daily. 60 tablet 11  . SYNTHROID 100 MCG tablet Take 100 mcg by mouth daily before breakfast.     . traZODone (DESYREL) 50 MG tablet Take 50 mg by mouth at bedtime.     Marland Kitchen zolpidem (AMBIEN) 10 MG tablet Take 10 mg by mouth at bedtime.      No current facility-administered medications for this visit.    Allergies:   Patient has no known allergies.   Social History:  The patient  reports that he quit smoking about 19 years ago. He quit smokeless tobacco use about 23 years ago. He reports current alcohol use of about 1.0 standard drinks of alcohol per week. He reports that he does not use drugs.   Family History:  The patient's family history includes Asthma in his mother; Heart attack in his father; Heart disease in his father; Hyperlipidemia in his brother; Parkinsonism in his mother; Prostate cancer in his father.   ROS:  Please see the history of present illness.   Otherwise, review of systems is positive for none.   All other systems are reviewed and negative.   PHYSICAL EXAM: VS:  BP 134/77   Pulse (!) 55   Ht 5\' 10"  (1.778 m)   Wt 224 lb (101.6 kg)   SpO2 97%   BMI 32.14 kg/m  , BMI Body mass index is 32.14 kg/m. GEN: Well nourished, well developed, in no acute distress  HEENT: normal  Neck: no JVD, carotid bruits, or masses Cardiac: RRR; no murmurs, rubs, or gallops,no edema  Respiratory:  clear to auscultation bilaterally, normal work of breathing GI: soft, nontender, nondistended, + BS MS: no deformity or atrophy  Skin: warm and dry Neuro:  Strength and sensation are intact Psych: euthymic mood, full affect  EKG:  EKG is ordered today. Personal review of  the ekg ordered shows sinus rhythm, rate 55, PVC   Recent Labs: 06/13/2018: TSH 1.840 08/04/2018: Magnesium 2.1 09/30/2018: BUN 16; Creatinine, Ser 1.28; Hemoglobin 14.1; Platelets 198; Potassium 3.9; Sodium 137    Lipid Panel     Component Value Date/Time   CHOL 113 07/19/2017 0755   TRIG 68 07/19/2017 0755   HDL 38 (L) 07/19/2017 0755   CHOLHDL 3.0 07/19/2017 0755   CHOLHDL 2.6 07/28/2015 1042   VLDL 17 07/28/2015 1042   LDLCALC 61 07/19/2017 0755     Wt Readings from Last 3 Encounters:  02/09/19 224 lb (101.6 kg)  11/25/18 218 lb 6.4 oz (99.1 kg)  11/05/18 220 lb 12.8 oz (100.2 kg)      Other studies Reviewed: Additional studies/ records that were reviewed today include: TTE 08/04/18  Review of the above records today demonstrates:   1. The left ventricle has normal systolic function, with an ejection fraction of 55-60%. The cavity size was normal. There is mildly increased left ventricular wall thickness. Left ventricular diastolic parameters were normal.  2. The right ventricle has normal systolic function. The cavity was normal.  3. Left atrial size was mildly dilated.  4. The mitral valve is abnormal. Mild thickening of the mitral valve leaflet.  5. The tricuspid valve is grossly normal.  6. The aortic valve is tricuspid. Mild thickening of the aortic valve. No stenosis of the aortic valve.  7. The aorta is abnormal in size and structure.  8. There is mild dilatation of the aortic root and of the ascending aorta measuring 38 mm.  9. Normal LV function; mild LVH; mild LAE.    ASSESSMENT AND PLAN:  1.  PVCs: 15% burden.  Status post ablation.  Unfortunately PVCs were coming from around the RCA and thus incomplete ablation was performed.  He is currently on sotalol.  Patient Kayden Hutmacher need regular ECG, magnesium, potassium monitoring.  Feeling much improved without symptoms of weakness and fatigue on his current dose.  No changes.  2.  Paroxysmal atrial fibrillation:  Currently on Eliquis.  CHA2DS2-VASc of 3.     3.  Hypertension: Currently well controlled  4.  Coronary artery disease: Nonobstructive by cath in 2015.  Currently on aspirin and Eliquis.   Current medicines are reviewed at length with the patient today.   The patient does not have concerns regarding his medicines.  The following changes were made today: None  Labs/ tests ordered today include:  Orders Placed This Encounter  Procedures  . Basic metabolic panel  . Magnesium  . EKG 12-Lead   Disposition:   FU with Latrel Szymczak 6 months  Signed, Danyela Posas Meredith Sanders, Logan Sanders  02/09/2019 9:50 AM     [facility] [address] [place] [address] [phone] (office) [phone] (fax)

## 2019-02-09 NOTE — Patient Instructions (Signed)
Medication Instructions:  Your physician recommends that you continue on your current medications as directed. Please refer to the Current Medication list given to you today.  *If you need a refill on your cardiac medications before your next appointment, please call your pharmacy*  Lab Work: Today, Sotalol surveillance labs:  Magnesium & BMET If you have labs (blood work) drawn today and your tests are completely normal, you will receive your results only by: Marland Kitchen MyChart Message (if you have MyChart) OR . A paper copy in the mail If you have any lab test that is abnormal or we need to change your treatment, we will call you to review the results.  Testing/Procedures: None ordered  Follow-Up: At [facility], you and your health needs are our priority.  As part of our continuing mission to provide you with exceptional heart care, we have created designated Provider Care Teams.  These Care Teams include your primary Cardiologist (physician) and Advanced Practice Providers (APPs -  Physician Assistants and Nurse Practitioners) who all work together to provide you with the care you need, when you need it.  Your next appointment:   6 month(s)  The format for your next appointment:   In Person  Provider:   Allegra Lai, MD  Thank you for visiting with [facility] today!!

## 2019-07-20 ENCOUNTER — Other Ambulatory Visit (INDEPENDENT_AMBULATORY_CARE_PROVIDER_SITE_OTHER): Payer: Self-pay | Admitting: Otolaryngology

## 2019-07-23 ENCOUNTER — Other Ambulatory Visit (INDEPENDENT_AMBULATORY_CARE_PROVIDER_SITE_OTHER): Payer: Self-pay

## 2019-08-05 ENCOUNTER — Other Ambulatory Visit (INDEPENDENT_AMBULATORY_CARE_PROVIDER_SITE_OTHER): Payer: Self-pay

## 2019-08-11 ENCOUNTER — Other Ambulatory Visit: Payer: Self-pay | Admitting: Cardiovascular Disease

## 2019-08-18 ENCOUNTER — Other Ambulatory Visit: Payer: Self-pay

## 2019-08-18 ENCOUNTER — Encounter (INDEPENDENT_AMBULATORY_CARE_PROVIDER_SITE_OTHER): Payer: Self-pay | Admitting: Otolaryngology

## 2019-08-18 ENCOUNTER — Ambulatory Visit (INDEPENDENT_AMBULATORY_CARE_PROVIDER_SITE_OTHER): Payer: Medicare Other | Admitting: Otolaryngology

## 2019-08-18 VITALS — Temp 97.2°F

## 2019-08-18 DIAGNOSIS — H6123 Impacted cerumen, bilateral: Secondary | ICD-10-CM

## 2019-08-18 DIAGNOSIS — H60313 Diffuse otitis externa, bilateral: Secondary | ICD-10-CM | POA: Diagnosis not present

## 2019-08-18 NOTE — Progress Notes (Signed)
HPI: Logan Sanders is a 74 y.o. male who returns today for evaluation of both ears being stopped up and stuff within the ear canals as well as itching in the ear canals.  No active drainage.  Past Medical History:  Diagnosis Date  . BPH (benign prostatic hyperplasia)   . CAD in native artery   . Complication of anesthesia    gets really anxious when waking up  . ED (erectile dysfunction)   . Hyperlipidemia   . Hypertension   . Hypothyroidism   . Osteoarthritis   . PVC's (premature ventricular contractions)    3-day cardiac monitor 07/2018: NSR, frequent PVCs (PVC burden 15.9%); rare PACs and rare episode of non-sustained SVT  //  Echo 08/2018: EF 55-60, mild LVH, normal RV SF, mild LAE, mild dilation of aortic root and ascending aorta (38 mm)   . Thyroid disease    Past Surgical History:  Procedure Laterality Date  . CARDIAC CATHETERIZATION  11/09/13   non obstructive disease in all vessels.  . COLONOSCOPY    . KNEE ARTHROSCOPY Left    x3  . LEFT HEART CATHETERIZATION WITH CORONARY ANGIOGRAM N/A 11/09/2013   Procedure: LEFT HEART CATHETERIZATION WITH CORONARY ANGIOGRAM;  Surgeon: Blane Ohara, MD;  Location: [facility] CATH LAB;  Service: Cardiovascular;  Laterality: N/A;  . LOWER BACK SURGERY  1985  . LUMBAR LAMINECTOMY/DECOMPRESSION MICRODISCECTOMY N/A 12/18/2017   Procedure: LAMINECTOMY AND FORAMINOTOMY LUMBAR TWO- LUMBAR THREE, LUMBAR THREE- LUMBAR FOUR, LUMBAR FOUR- LUMBAR FIVE;  Surgeon: Aleida Crandell Pies, MD;  Location: [place];  Service: Neurosurgery;  Laterality: N/A;  . NECK SURGERY  1999  . PVC ABLATION N/A 10/07/2018   Procedure: PVC ABLATION;  Surgeon: Constance Haw, MD;  Location: [place] CV LAB;  Service: Cardiovascular;  Laterality: N/A;  . REPLACEMENT TOTAL KNEE  2005   LEFT KNEE  . WISDOM TOOTH EXTRACTION     Social History   Socioeconomic History  . Marital status: Married    Spouse name: Not on file  . Number of children: Not on file  . Years of  education: Not on file  . Highest education level: Not on file  Occupational History  . Not on file  Tobacco Use  . Smoking status: Former Smoker    Packs/day: 0.50    Years: 5.00    Pack years: 2.50    Quit date: 05/06/1999    Years since quitting: 20.2  . Smokeless tobacco: Former Systems developer    Quit date: 07/28/1995  . Tobacco comment: smoked on and off  Vaping Use  . Vaping Use: Never used  Substance and Sexual Activity  . Alcohol use: Yes    Alcohol/week: 1.0 standard drink    Types: 1 Cans of beer per week    Comment: occasionally  . Drug use: No  . Sexual activity: Not on file  Other Topics Concern  . Not on file  Social History Narrative  . Not on file   Social Determinants of Health   Financial Resource Strain:   . Difficulty of Paying Living Expenses:   Food Insecurity:   . Worried About Charity fundraiser in the Last Year:   . Arboriculturist in the Last Year:   Transportation Needs:   . Film/video editor (Medical):   Marland Kitchen Lack of Transportation (Non-Medical):   Physical Activity:   . Days of Exercise per Week:   . Minutes of Exercise per Session:   Stress:   . Feeling  of Stress :   Social Connections:   . Frequency of Communication with Friends and Family:   . Frequency of Social Gatherings with Friends and Family:   . Attends Religious Services:   . Active Member of Clubs or Organizations:   . Attends Archivist Meetings:   Marland Kitchen Marital Status:    Family History  Problem Relation Age of Onset  . Asthma Mother   . Parkinsonism Mother   . Heart disease Father   . Heart attack Father   . Prostate cancer Father   . Hyperlipidemia Brother    No Known Allergies Prior to Admission medications   Medication Sig Start Date End Date Taking? Authorizing Provider  albuterol (VENTOLIN HFA) 108 (90 Base) MCG/ACT inhaler Inhale 2 puffs into the lungs every 6 (six) hours as needed for wheezing or shortness of breath.    Yes [provider]   amLODipine (NORVASC) 5 MG tablet TAKE 1 TABLET ONCE DAILY. 08/04/18  Yes Weaver, Scott T, PA-C  atorvastatin (LIPITOR) 40 MG tablet Take 1 tablet (40 mg total) by mouth at bedtime. 11/06/18  Yes Nahser, Wonda Cheng, MD  Cholecalciferol (D3-1000) 25 MCG (1000 UT) tablet Take 1,000 Units by mouth daily.   Yes [provider]  Co-Enzyme Q-10 100 MG CAPS Take 100 mg by mouth daily.    Yes [provider]  Cyanocobalamin (VITAMIN B-12 PO) Take 1 tablet by mouth daily.   Yes [provider]  doxazosin (CARDURA) 8 MG tablet Take 8 mg by mouth at bedtime.    Yes [provider]  ELIQUIS 5 MG TABS tablet TAKE 1 TABLET BY MOUTH TWICE DAILY. 01/19/19  Yes Nahser, Wonda Cheng, MD  fexofenadine (ALLEGRA) 180 MG tablet Take 180 mg by mouth daily as needed for allergies.    Yes [provider]  hydrochlorothiazide (HYDRODIURIL) 12.5 MG tablet Take 12.5 mg by mouth at bedtime.    Yes [provider]  multivitamin-iron-minerals-folic acid (CENTRUM) chewable tablet Chew 1 tablet by mouth daily.   Yes [provider]  olmesartan (BENICAR) 40 MG tablet Take 40 mg by mouth at bedtime.    Yes [provider]  Omega-3 Fatty Acids (FISH OIL PO) Take 2,000 mg by mouth daily.    Yes [provider]  omeprazole (PRILOSEC) 40 MG capsule Take 40 mg by mouth daily as needed (for acid reflux).   Yes [provider]  potassium chloride (KLOR-CON) 10 MEQ tablet TAKE 1 TABLET EACH DAY. 08/12/19  Yes Nahser, Wonda Cheng, MD  sotalol (BETAPACE) 80 MG tablet Take 1 tablet (80 mg total) by mouth 2 (two) times daily. 11/17/18  Yes Camnitz, Will Hassell Done, MD  SYNTHROID 100 MCG tablet Take 100 mcg by mouth daily before breakfast.  09/28/13  Yes [provider]  traZODone (DESYREL) 50 MG tablet Take 50 mg by mouth at bedtime.    Yes [provider]  zolpidem (AMBIEN) 10 MG tablet Take 10 mg by mouth at bedtime.  09/10/13  Yes [provider]      Positive ROS: Otherwise negative  All other systems have been reviewed and were otherwise negative with the exception of those mentioned in the HPI and as above.  Physical Exam: Constitutional: Alert, well-appearing, no acute distress Ears: External ears without lesions or tenderness. Ear canals are completely occluded with wax and debris as well as chronic inflammation on both sides.  Ear canals were cleaned with hydroperoxide and suction until totally cleared and then I  applied gentian violet, Floxin and CSF powder to both ears. Nasal: External nose without lesions.. Clear nasal passages Oral: Lips and gums without lesions. Tongue and palate mucosa without lesions. Posterior oropharynx clear. Neck: No palpable adenopathy or masses Respiratory: Breathing comfortably  Skin: No facial/neck lesions or rash noted.  Cerumen impaction removal  Date/Time: 08/18/2019 5:12 PM Performed by: Rozetta Nunnery, MD Authorized by: Rozetta Nunnery, MD   Consent:    Consent obtained:  Verbal   Consent given by:  Patient   Risks discussed:  Pain and bleeding Procedure details:    Location:  L ear and R ear   Procedure type: curette and suction   Post-procedure details:    Inspection:  TM intact   Hearing quality:  Improved   Patient tolerance of procedure:  Tolerated well, no immediate complications Comments:     Patient with chronic bilateral external otitis.  I applied gentian violet, Floxin and CSF powder to both ear canals.    Assessment: Chronic external otitis bilaterally  Plan: Ear canals were cleaned in the office and I applied gentian violet, Floxin and CSF powder to both ear canals. Recommended keeping the ears dry and do not use ear buds for the next 5 days. I prescribed Cortisporin otic suspension drops to use if he has any pain itching or discomfort in the ears.   Radene Journey, MD

## 2019-08-25 ENCOUNTER — Ambulatory Visit: Payer: Medicare Other | Admitting: Cardiology

## 2019-08-25 ENCOUNTER — Other Ambulatory Visit: Payer: Self-pay

## 2019-08-25 ENCOUNTER — Encounter: Payer: Self-pay | Admitting: Cardiology

## 2019-08-25 VITALS — BP 126/76 | HR 65 | Ht 69.0 in | Wt 225.8 lb

## 2019-08-25 DIAGNOSIS — I493 Ventricular premature depolarization: Secondary | ICD-10-CM

## 2019-08-25 NOTE — Patient Instructions (Signed)
Medication Instructions:  Your physician recommends that you continue on your current medications as directed. Please refer to the Current Medication list given to you today.  *If you need a refill on your cardiac medications before your next appointment, please call your pharmacy*   Lab Work: None ordered If you have labs (blood work) drawn today and your tests are completely normal, you will receive your results only by: . MyChart Message (if you have MyChart) OR . A paper copy in the mail If you have any lab test that is abnormal or we need to change your treatment, we will call you to review the results.   Testing/Procedures: None ordered   Follow-Up: At [facility], you and your health needs are our priority.  As part of our continuing mission to provide you with exceptional heart care, we have created designated Provider Care Teams.  These Care Teams include your primary Cardiologist (physician) and Advanced Practice Providers (APPs -  Physician Assistants and Nurse Practitioners) who all work together to provide you with the care you need, when you need it.  We recommend signing up for the patient portal called "MyChart".  Sign up information is provided on this After Visit Summary.  MyChart is used to connect with patients for Virtual Visits (Telemedicine).  Patients are able to view lab/test results, encounter notes, upcoming appointments, etc.  Non-urgent messages can be sent to your provider as well.   To learn more about what you can do with MyChart, go to [email or web address].    Your next appointment:   6 month(s)  The format for your next appointment:   In Person  Provider:   Will Camnitz, MD   Thank you for choosing [facility]!!   Cloria Ciresi, RN [phone]    Other Instructions    

## 2019-08-25 NOTE — Progress Notes (Signed)
Electrophysiology Office Note   Date:  08/25/2019   ID:  Logan Sanders, DOB 11/13/1945, MRN [id number]  PCP:  Kelton Pillar, MD  Cardiologist:  Johnsie Cancel Primary Electrophysiologist:  Trev Boley [name], MD    No chief complaint on file.    History of Present Illness: Logan Sanders is a 74 y.o. male who is being seen today for the evaluation of PVCs at the request of Kelton Pillar, MD. Presenting today for electrophysiology evaluation.  He has a history of paroxysmal atrial fibrillation on Eliquis, coronary artery disease, hypertension, hyperlipidemia, hypothyroidism, and PVCs.  Unfortunately PVCs were coming from close proximity to the RCA.  This was not ablated and he was started on sotalol.  Today, denies symptoms of palpitations, chest pain, shortness of breath, orthopnea, PND, lower extremity edema, claudication, dizziness, presyncope, syncope, bleeding, or neurologic sequela. The patient is tolerating medications without difficulties.  He has been doing well.  He has no chest pain or shortness of breath.  He is able to do all of his daily activities without restriction.  He is unaware of further PVCs.  He is also not had any atrial fibrillation.  Of note, his grandson did enroll in the [place].  Past Medical History:  Diagnosis Date  . BPH (benign prostatic hyperplasia)   . CAD in native artery   . Complication of anesthesia    gets really anxious when waking up  . ED (erectile dysfunction)   . Hyperlipidemia   . Hypertension   . Hypothyroidism   . Osteoarthritis   . PVC's (premature ventricular contractions)    3-day cardiac monitor 07/2018: NSR, frequent PVCs (PVC burden 15.9%); rare PACs and rare episode of non-sustained SVT  //  Echo 08/2018: EF 55-60, mild LVH, normal RV SF, mild LAE, mild dilation of aortic root and ascending aorta (38 mm)   . Thyroid disease    Past Surgical History:  Procedure Laterality Date  . CARDIAC CATHETERIZATION  11/09/13   non  obstructive disease in all vessels.  . COLONOSCOPY    . KNEE ARTHROSCOPY Left    x3  . LEFT HEART CATHETERIZATION WITH CORONARY ANGIOGRAM N/A 11/09/2013   Procedure: LEFT HEART CATHETERIZATION WITH CORONARY ANGIOGRAM;  Surgeon: Blane Ohara, MD;  Location: [facility] CATH LAB;  Service: Cardiovascular;  Laterality: N/A;  . LOWER BACK SURGERY  1985  . LUMBAR LAMINECTOMY/DECOMPRESSION MICRODISCECTOMY N/A 12/18/2017   Procedure: LAMINECTOMY AND FORAMINOTOMY LUMBAR TWO- LUMBAR THREE, LUMBAR THREE- LUMBAR FOUR, LUMBAR FOUR- LUMBAR FIVE;  Surgeon: Newman Pies, MD;  Location: [place];  Service: Neurosurgery;  Laterality: N/A;  . NECK SURGERY  1999  . PVC ABLATION N/A 10/07/2018   Procedure: PVC ABLATION;  Surgeon: Constance Haw, MD;  Location: [place] [facility];  Service: Cardiovascular;  Laterality: N/A;  . REPLACEMENT TOTAL KNEE  2005   LEFT KNEE  . WISDOM TOOTH EXTRACTION       Current Outpatient Medications  Medication Sig Dispense Refill  . albuterol (VENTOLIN HFA) 108 (90 Base) MCG/ACT inhaler Inhale 2 puffs into the lungs every 6 (six) hours as needed for wheezing or shortness of breath.     Marland Kitchen amLODipine (NORVASC) 5 MG tablet TAKE 1 TABLET ONCE DAILY. 30 tablet 10  . atorvastatin (LIPITOR) 40 MG tablet Take 1 tablet (40 mg total) by mouth at bedtime. 90 tablet 3  . Cholecalciferol (D3-1000) 25 MCG (1000 UT) tablet Take 1,000 Units by mouth daily.    Marland Kitchen Co-Enzyme Q-10 100 MG CAPS Take  100 mg by mouth daily.     . Cyanocobalamin (VITAMIN B-12 PO) Take 1 tablet by mouth daily.    Marland Kitchen doxazosin (CARDURA) 8 MG tablet Take 8 mg by mouth at bedtime.     Marland Kitchen ELIQUIS 5 MG TABS tablet TAKE 1 TABLET BY MOUTH TWICE DAILY. 60 tablet 10  . fexofenadine (ALLEGRA) 180 MG tablet Take 180 mg by mouth daily as needed for allergies.     . hydrochlorothiazide (HYDRODIURIL) 12.5 MG tablet Take 12.5 mg by mouth at bedtime.     . multivitamin-iron-minerals-folic acid (CENTRUM) chewable tablet Chew 1 tablet by  mouth daily.    Marland Kitchen olmesartan (BENICAR) 40 MG tablet Take 40 mg by mouth at bedtime.     . Omega-3 Fatty Acids (FISH OIL PO) Take 2,000 mg by mouth daily.     Marland Kitchen omeprazole (PRILOSEC) 40 MG capsule Take 40 mg by mouth daily as needed (for acid reflux).    . potassium chloride (KLOR-CON) 10 MEQ tablet TAKE 1 TABLET EACH DAY. 90 tablet 1  . sotalol (BETAPACE) 80 MG tablet Take 1 tablet (80 mg total) by mouth 2 (two) times daily. 60 tablet 11  . SYNTHROID 100 MCG tablet Take 100 mcg by mouth daily before breakfast.     . traZODone (DESYREL) 50 MG tablet Take 50 mg by mouth at bedtime.     Marland Kitchen zolpidem (AMBIEN) 10 MG tablet Take 10 mg by mouth at bedtime.      No current facility-administered medications for this visit.    Allergies:   Patient has no known allergies.   Social History:  The patient  reports that he quit smoking about 20 years ago. He has a 2.50 pack-year smoking history. He quit smokeless tobacco use about 24 years ago. He reports current alcohol use of about 1.0 standard drink of alcohol per week. He reports that he does not use drugs.   Family History:  The patient's family history includes Asthma in his mother; Heart attack in his father; Heart disease in his father; Hyperlipidemia in his brother; Parkinsonism in his mother; Prostate cancer in his father.   ROS:  Please see the history of present illness.   Otherwise, review of systems is positive for none.   All other systems are reviewed and negative.   PHYSICAL EXAM: VS:  BP 126/76   Pulse 65   Ht 5\' 9"  (1.753 m)   Wt 225 lb 12.8 oz (102.4 kg)   SpO2 94%   BMI 33.34 kg/m  , BMI Body mass index is 33.34 kg/m. GEN: Well nourished, well developed, in no acute distress  HEENT: normal  Neck: no JVD, carotid bruits, or masses Cardiac: RRR; no murmurs, rubs, or gallops,no edema  Respiratory:  clear to auscultation bilaterally, normal work of breathing GI: soft, nontender, nondistended, + BS MS: no deformity or atrophy   Skin: warm and dry Neuro:  Strength and sensation are intact Psych: euthymic mood, full affect  EKG:  EKG is ordered today. Personal review of the ekg ordered shows sinus rhythm, rate 57  Recent Labs: 09/30/2018: Hemoglobin 14.1; Platelets 198 02/09/2019: BUN 18; Creatinine, Ser 1.28; Magnesium 2.1; Potassium 4.0; Sodium 140    Lipid Panel     Component Value Date/Time   CHOL 113 07/19/2017 0755   TRIG 68 07/19/2017 0755   HDL 38 (L) 07/19/2017 0755   CHOLHDL 3.0 07/19/2017 0755   CHOLHDL 2.6 07/28/2015 1042   VLDL 17 07/28/2015 1042   LDLCALC 61 07/19/2017  0755     Wt Readings from Last 3 Encounters:  08/25/19 225 lb 12.8 oz (102.4 kg)  02/09/19 224 lb (101.6 kg)  11/25/18 218 lb 6.4 oz (99.1 kg)      Other studies Reviewed: Additional studies/ records that were reviewed today include: TTE 08/04/18  Review of the above records today demonstrates:   1. The left ventricle has normal systolic function, with an ejection fraction of 55-60%. The cavity size was normal. There is mildly increased left ventricular wall thickness. Left ventricular diastolic parameters were normal.  2. The right ventricle has normal systolic function. The cavity was normal.  3. Left atrial size was mildly dilated.  4. The mitral valve is abnormal. Mild thickening of the mitral valve leaflet.  5. The tricuspid valve is grossly normal.  6. The aortic valve is tricuspid. Mild thickening of the aortic valve. No stenosis of the aortic valve.  7. The aorta is abnormal in size and structure.  8. There is mild dilatation of the aortic root and of the ascending aorta measuring 38 mm.  9. Normal LV function; mild LVH; mild LAE.    ASSESSMENT AND PLAN:  1.  PVCs: Burden of 15%.  On 10/07/2018.  Unfortunately PVCs were coming from around the RCA and incomplete ablation was performed.  He is currently on sotalol.  He Meliana Canner need regular ECG monitoring for this high risk medication.  He remains in sinus rhythm  without PVCs today.  He is currently feeling well.  No changes.  2.  Paroxysmal atrial fibrillation: Currently on Eliquis and sotalol with a CHA2DS2-VASc of 3.   3.  Hypertension: Currently well controlled  4.  Coronary artery disease: Nonobstructive by cath in 2015.  Is currently on aspirin and Eliquis.   Current medicines are reviewed at length with the patient today.   The patient does not have concerns regarding his medicines.  The following changes were made today: None  Labs/ tests ordered today include:  Orders Placed This Encounter  Procedures  . EKG 12-Lead   Disposition:   FU with Zaidyn Claire 6 months  Signed, Jenipher Havel [name], MD  08/25/2019 11:03 AM     [facility] [address] [place] [address] [phone] (office) [phone] (fax)

## 2019-09-09 ENCOUNTER — Encounter (INDEPENDENT_AMBULATORY_CARE_PROVIDER_SITE_OTHER): Payer: Self-pay

## 2019-09-09 NOTE — Progress Notes (Unsigned)
On 08/05/2019 I faxed Rx request into [facility]. [place]. Neomycin-polymyxin 10 ml. Use 4 to 5 drops in effected ear 3 x's a day for 5 days as needed for itching or discomfort. Added 1 refill.

## 2019-09-28 ENCOUNTER — Other Ambulatory Visit: Payer: Self-pay | Admitting: Cardiology

## 2019-11-02 ENCOUNTER — Other Ambulatory Visit: Payer: Self-pay | Admitting: Cardiovascular Disease

## 2019-11-29 ENCOUNTER — Encounter: Payer: Self-pay | Admitting: Cardiovascular Disease

## 2019-11-29 NOTE — Progress Notes (Signed)
[name] Date of Birth  Jun 17, 1945       [name] [address], [address], [place], [place]  [address]   [place], [place]  [address] [place]   Fax  [phone]     Fax [phone]  Problem List: 1. Hypertension 2. Hyperlipidemia 3. Hypothyroidism 4. CAD -   Moderate CAD by cath 11/09/2013 5. Paroxsymal atrial fib:     [name] is a 74 old gentleman with a history of hypertension, hyperlipidemia and a positive family history of cardiac disease. He was referred for possible stress testing given his risk factors.  He has not had any symptoms of CP or dyspnea.  He worked for the [employer] for 33 years - retired 2005.  Currently, he spends his time riding his [place] , plays golf, hunts deer, [place], squirrel, .  Walks about a mile each day.  Does all this without any CP or dyspnea.  Lifts light weights and uses resistance bands.    Grandfather died at age 6, father died at age 64. Uncle died in his 92's   11/13/2013:  Kerrion presents today for followup. We performed a coronary calcium score which revealed significant calcification: Dense calcification distal LM Scattered calcification of mid and distal LAD and mid and distal RCA  Subsequent stress myoview was abnormal Overall Impression: Intermediate risk stress nuclear study with mild lateral wall ischemia and mildly depressed LV systolic function.  LV Ejection Fraction: 47%. LV Wall Motion: NL LV Function; NL Wall Motion  Jan. 13, 2016: [name] is a 74 yo with an abnormal coronary calcium score - significant calcification: Dense calcification distal LM Scattered calcification of mid and distal LAD and mid and distal RCA Myoview was abnormal and he was referred for cath.   LM  moderate calcification. The vessel has 20-30% stenosis in its midportion. The ostium of the left main is widely patent. The distal left main is  widely patent.    (LAD): the LAD is medium in caliber. The vessel reaches the LV apex. The proximal vessel has diffuse irregularity with mild calcification.  D1 has 30-40% stenosis at its ostium. The mid LAD at that bifurcation also has 30-40% stenosis. The remaining portions of the LAD have mild nonobstructive disease. Left circumflex (LCx): the left circumflex is patent. The mid vessel has 40% stenosis. There is a tiny obtuse marginal branch that has 50% stenosis. The major OM branch is widely patent with minor nonobstructive disease. Right coronary artery (RCA): this is a dominant vessel. There is diffuse calcification and irregularity but there are no high-grade stenoses present. There is no more than about 20 or 30% stenosis which is most significant at the junction of the mid and distal RCA. The PDA and PLA branches are patent. Left ventriculography: Left ventricular systolic function is normal, LVEF is estimated at 55%  Feeling well.  No CP . The cath showed mild - moderate irregularities. He has not had any further issues  July 19, 2014:   [name] is doing well.  No dizziness, no syncope  Has some ringing in his ears.  Takes his BP at home, readings are in the normal range.   Jan. 23, 2017: [name] is seen back for a follow up visit. Walks on occasion .  Not as much exercise as in the summer . No angina . No dyspnea.  BP  is well controlled.   Sept. 6, 2017:  Isreal is seen back for follow up of newly diagnosed atrial fib.  The afib was diagnosed when he came for his echo - was seen incidentally on the echo. Has been started on Eliquis  No bleeding  Still exercising . Has been watching his salt .   March 01, 2016:    Seen back for his mild CAD and Paroxysmal atrial fib  Cannot tell when he is in Atrial fib. Has some indigestion.  Belches and feels better  Not exercising much.  Weather has been challenging  No CP or dyspnea doing his regular activities.   Sept. 27, 2018:     Leelan is seen back for follow-up of his mild to moderate coronary artery disease and his paroxysmal atrial fibrillation. Has had some ankle swelling.   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  Is also on amlodipine ( which is contributing to the edema )   July 19, 2017: Dain is seen today for follow-up of his mild coronary artery disease, paroxysmal atrial fib .  He pulled a muscle in his back and is requesting some muscle relaxers.  December 04, 2017:  Ysmael is seen today for follow-up of his moderate coronary artery disease and paroxysmal atrial fibrillation.  He was recently seen at Dr. Dellis Filbert office.  He was found to have a blood pressure with a a moderately high systolic reading in a very low diastolic reading. Is having frequent PVCs  SPECT that his premature ventricular contractions contributed to his very low diastolic blood pressure reading.  He is fairly active.  He is limited by his hip pain.  Is scheduled to have back surgery on Dec. 18, 2019 hes not having any CP . He is at low risk for this surgery .   Also has had some hemorrhoid issues Needs to have them banded and needs to hold Eliquis for 3 days with that. I've given him the OK for that procedure as well.     July 18, 2018   Is under lots of stress,  duaghter was just dx'd with breast cancer   A myoview was ordered for his increased palpitatinos. And a PVC burden of 15.9%.    Saw Camnitz for his large PVC burden.   Was stared on Mexilitine 150 mg PO BID .  At times he thinks that the mexiletine has helped with the palpitations but at other times the palpitations seem to be just as bad.   EF was noted to be llow - echo has been ordered to verifty his EF   Echocardiogram performed December, 2019 revealed normal left ventricular systolic function.  He had mild diastolic dysfunction.  Nov. 24, 2020  Zackary is  seen today for follow-up of his premature ventricular contractions.  He  saw Dr. Curt Bears several weeks ago.   He did a PVC ablation but it did not work completely .     He was started on sotalol 40 mg a day  instead of mexiletine.  Dose was incrased to 80.  Coreg has been stopped Feeling well.   Has lost some weight  BP is a little elevated.   He says this is unusual .   Nov. 29, 2021: Clee is seen today for follow up visit. He has had a PVC ablation by Dr. Curt Bears in 2020. Was not completely successful Was started on Sotolol.   Here for pre-op evaluation for  carpule tunnel surgery  myoview study in July , 2020 showed no ischemia  Echo reveals normal LV function .  Current Outpatient Medications  Medication Sig Dispense Refill  . albuterol (VENTOLIN HFA) 108 (90 Base) MCG/ACT inhaler Inhale 2 puffs into the lungs every 6 (six) hours as needed for wheezing or shortness of breath.     Marland Kitchen amLODipine (NORVASC) 5 MG tablet TAKE 1 TABLET ONCE DAILY. 30 tablet 10  . atorvastatin (LIPITOR) 40 MG tablet Take 1 tablet (40 mg total) by mouth daily. Please keep upcoming appt in November with Dr. Acie Fredrickson before anymore refills. Thank you 90 tablet 0  . Cholecalciferol (D3-1000) 25 MCG (1000 UT) tablet Take 1,000 Units by mouth daily.    Marland Kitchen Co-Enzyme Q-10 100 MG CAPS Take 100 mg by mouth daily.     . Cyanocobalamin (VITAMIN B-12 PO) Take 1 tablet by mouth daily.    . diazepam (VALIUM) 5 MG tablet Take 5 mg by mouth as needed.    . doxazosin (CARDURA) 8 MG tablet Take 8 mg by mouth at bedtime.     Marland Kitchen ELIQUIS 5 MG TABS tablet TAKE 1 TABLET BY MOUTH TWICE DAILY. 60 tablet 10  . fexofenadine (ALLEGRA) 180 MG tablet Take 180 mg by mouth daily as needed for allergies.     . hydrochlorothiazide (HYDRODIURIL) 12.5 MG tablet Take 12.5 mg by mouth at bedtime.     . multivitamin-iron-minerals-folic acid (CENTRUM) chewable tablet Chew 1 tablet by mouth daily.    Marland Kitchen olmesartan (BENICAR) 40 MG tablet Take 40 mg by mouth at bedtime.     . Omega-3 Fatty Acids (FISH OIL PO) Take 2,000 mg by  mouth daily.     Marland Kitchen omeprazole (PRILOSEC) 40 MG capsule Take 40 mg by mouth daily as needed (for acid reflux).    . potassium chloride (KLOR-CON) 10 MEQ tablet TAKE 1 TABLET EACH DAY. 90 tablet 1  . sotalol (BETAPACE) 80 MG tablet Take 1 tablet (80 mg total) by mouth 2 (two) times daily. Please keep upcoming appt in November with Dr. Acie Fredrickson for future refills. Thank you 180 tablet 0  . SYNTHROID 100 MCG tablet Take 100 mcg by mouth daily before breakfast.     . traZODone (DESYREL) 50 MG tablet Take 50 mg by mouth at bedtime.     Marland Kitchen zolpidem (AMBIEN) 10 MG tablet Take 10 mg by mouth at bedtime.      No current facility-administered medications for this visit.     No Known Allergies  Past Medical History:  Diagnosis Date  . BPH (benign prostatic hyperplasia)   . CAD in native artery   . Complication of anesthesia    gets really anxious when waking up  . ED (erectile dysfunction)   . Hyperlipidemia   . Hypertension   . Hypothyroidism   . Osteoarthritis   . PVC's (premature ventricular contractions)    3-day cardiac monitor 07/2018: NSR, frequent PVCs (PVC burden 15.9%); rare PACs and rare episode of non-sustained SVT  //  Echo 08/2018: EF 55-60, mild LVH, normal RV SF, mild LAE, mild dilation of aortic root and ascending aorta (38 mm)   . Thyroid disease     Past Surgical History:  Procedure Laterality Date  . CARDIAC CATHETERIZATION  11/09/13   non obstructive disease in all vessels.  . COLONOSCOPY    . KNEE ARTHROSCOPY Left    x3  . LEFT HEART CATHETERIZATION WITH CORONARY ANGIOGRAM N/A 11/09/2013   Procedure: LEFT HEART CATHETERIZATION WITH  CORONARY ANGIOGRAM;  Surgeon: Blane Ohara, MD;  [facility] C[facility] LAB;  Service: Cardiovascular;  Laterality: N/A;  . LOWER BACK SURGERY  1985  . LUMBAR LAMINECTOMY/DECOMPRESSION MICRODISCECTOMY N/A 12/18/2017   Procedure: LAMINECTOMY AND FORAMINOTOMY LUMBAR TWO- LUMBAR THREE, LUMBAR THREE- LUMBAR FOUR, LUMBAR FOUR- LUMBAR FIVE;  Surgeon:  Newman Pies, MD;  Location: [facility];  Service: Neurosurgery;  Laterality: N/A;  . NECK SURGERY  1999  . PVC ABLATION N/A 10/07/2018   Procedure: PVC ABLATION;  Surgeon: Constance Haw, MD;  Location: [facility] CV LAB;  Service: Cardiovascular;  Laterality: N/A;  . REPLACEMENT TOTAL KNEE  2005   LEFT KNEE  . WISDOM TOOTH EXTRACTION      Social History   Tobacco Use  Smoking Status Former Smoker  . Packs/day: 0.50  . Years: 5.00  . Pack years: 2.50  . Quit date: 05/06/1999  . Years since quitting: 20.5  Smokeless Tobacco Former Systems developer  . Quit date: 07/28/1995  Tobacco Comment   smoked on and off    Social History   Substance and Sexual Activity  Alcohol Use Yes  . Alcohol/week: 1.0 standard drink  . Types: 1 Cans of beer per week   Comment: occasionally    Family History  Problem Relation Age of Onset  . Asthma Mother   . Parkinsonism Mother   . Heart disease Father   . Heart attack Father   . Prostate cancer Father   . Hyperlipidemia Brother     Reviw of Systems:  Reviewed in the HPI.  All other systems are negative.  Physical Exam: Blood pressure 134/60, pulse (!) 54, height 5\' 9"  (1.753 m), weight 224 lb 6.4 oz (101.8 kg), SpO2 95 %.  GEN:  Well nourished, well developed in no acute distress HEENT: Normal NECK: No JVD; No carotid bruits LYMPHATICS: No lymphadenopathy CARDIAC: RRR , no murmurs, rubs, gallops RESPIRATORY:  Clear to auscultation without rales, wheezing or rhonchi  ABDOMEN: Soft, non-tender, non-distended MUSCULOSKELETAL:  No edema; No deformity  SKIN: Warm and dry NEUROLOGIC:  Alert and oriented x 3   ECG:    08/26/2019: Sinus bradycardia at 57.  No ST or T wave changes.   Assessment / Plan:   1. Hypertension -  BP is well controolled  2.  Frequent premature ventricular contractions:  . 3. CAD -    No angina   4.  Hyperlipidemia:   Stable, managed by Dr. Laurann Montana    5.  PAF _   He is at low risk for his carpal tunnel surgery.   I have given the okay for him to hold his Eliquis the night before surgery and the morning of surgery.  He should restart his Eliquis as soon as it is safe following his carpal tunnel surgery.  Likely he will be restarting it the next day.  Mertie Moores, MD  11/30/2019 9:50 AM    [address],  [place], [place]  [address] Pager [phone] Phone: [phone]; Fax: [phone]

## 2019-11-30 ENCOUNTER — Ambulatory Visit: Payer: Medicare Other | Admitting: Cardiovascular Disease

## 2019-11-30 ENCOUNTER — Encounter: Payer: Self-pay | Admitting: Cardiovascular Disease

## 2019-11-30 ENCOUNTER — Other Ambulatory Visit: Payer: Self-pay

## 2019-11-30 VITALS — BP 134/60 | HR 54 | Ht 69.0 in | Wt 224.4 lb

## 2019-11-30 DIAGNOSIS — E7849 Other hyperlipidemia: Secondary | ICD-10-CM | POA: Diagnosis not present

## 2019-11-30 DIAGNOSIS — I480 Paroxysmal atrial fibrillation: Secondary | ICD-10-CM

## 2019-11-30 MED ORDER — SOTALOL HCL 80 MG PO TABS
80.0000 mg | ORAL_TABLET | Freq: Two times a day (BID) | ORAL | 3 refills | Status: DC
Start: 2019-11-30 — End: 2020-12-27

## 2019-11-30 MED ORDER — ATORVASTATIN CALCIUM 40 MG PO TABS
40.0000 mg | ORAL_TABLET | Freq: Every day | ORAL | 3 refills | Status: DC
Start: 2019-11-30 — End: 2021-01-17

## 2019-11-30 NOTE — Patient Instructions (Signed)
Medication Instructions:  Your physician recommends that you continue on your current medications as directed. Please refer to the Current Medication list given to you today.  *If you need a refill on your cardiac medications before your next appointment, please call your pharmacy*   Lab Work: None If you have labs (blood work) drawn today and your tests are completely normal, you will receive your results only by: MyChart Message (if you have MyChart) OR A paper copy in the mail If you have any lab test that is abnormal or we need to change your treatment, we will call you to review the results.   Testing/Procedures: None   Follow-Up: At [facility], you and your health needs are our priority.  As part of our continuing mission to provide you with exceptional heart care, we have created designated Provider Care Teams.  These Care Teams include your primary Cardiologist (physician) and Advanced Practice Providers (APPs -  Physician Assistants and Nurse Practitioners) who all work together to provide you with the care you need, when you need it.  We recommend signing up for the patient portal called "MyChart".  Sign up information is provided on this After Visit Summary.  MyChart is used to connect with patients for Virtual Visits (Telemedicine).  Patients are able to view lab/test results, encounter notes, upcoming appointments, etc.  Non-urgent messages can be sent to your provider as well.   To learn more about what you can do with MyChart, go to [email or web address].    Your next appointment:   1 year(s)  The format for your next appointment:   In Person  Provider:   You will see one of the following Advanced Practice Providers on your designated Care Team:   Scott Weaver, PA-C Vin Bhagat, PA-C   Other Instructions   

## 2020-01-06 ENCOUNTER — Other Ambulatory Visit: Payer: Self-pay | Admitting: Cardiovascular Disease

## 2020-01-06 DIAGNOSIS — I2510 Atherosclerotic heart disease of native coronary artery without angina pectoris: Secondary | ICD-10-CM

## 2020-01-06 DIAGNOSIS — I10 Essential (primary) hypertension: Secondary | ICD-10-CM

## 2020-01-06 NOTE — Telephone Encounter (Signed)
Prescription refill request for Eliquis received.  Indication: Afib Last office visit: Nahser, 11/30/2019 Scr: 1.28, 02/09/2019 Age: 75 yo Weight: 101.8 kg   Prescription refill sent.

## 2020-01-07 NOTE — Telephone Encounter (Signed)
Pt last saw Dr Elease Hashimoto 11/30/19, last labs 02/09/19 Creat 1.28, age 75, weight 101.8kg, based on specified criteria pt is on appropriate dosage of Eliquis 5mg  BID.  Will refill rx.

## 2020-02-05 ENCOUNTER — Other Ambulatory Visit: Payer: Self-pay | Admitting: Cardiovascular Disease

## 2020-02-05 MED ORDER — POTASSIUM CHLORIDE ER 10 MEQ PO TBCR
EXTENDED_RELEASE_TABLET | ORAL | 3 refills | Status: DC
Start: 2020-02-05 — End: 2021-03-15

## 2020-02-22 ENCOUNTER — Encounter: Payer: Self-pay | Admitting: Cardiology

## 2020-02-22 ENCOUNTER — Ambulatory Visit: Payer: Medicare Other | Admitting: Cardiology

## 2020-02-22 ENCOUNTER — Other Ambulatory Visit: Payer: Self-pay

## 2020-02-22 VITALS — BP 134/66 | HR 58 | Ht 69.0 in | Wt 222.6 lb

## 2020-02-22 DIAGNOSIS — Z79899 Other long term (current) drug therapy: Secondary | ICD-10-CM | POA: Diagnosis not present

## 2020-02-22 DIAGNOSIS — I493 Ventricular premature depolarization: Secondary | ICD-10-CM

## 2020-02-22 LAB — BASIC METABOLIC PANEL
BUN/Creatinine Ratio: 14 (ref 10–24)
BUN: 17 mg/dL (ref 8–27)
CO2: 21 mmol/L (ref 20–29)
Calcium: 9.2 mg/dL (ref 8.6–10.2)
Chloride: 105 mmol/L (ref 96–106)
Creatinine, Ser: 1.25 mg/dL (ref 0.76–1.27)
GFR calc Af Amer: 66 mL/min/{1.73_m2} (ref >59)
GFR calc non Af Amer: 57 mL/min/{1.73_m2} — ABNORMAL LOW (ref >59)
Glucose: 101 mg/dL — ABNORMAL HIGH (ref 65–99)
Potassium: 3.8 mmol/L (ref 3.5–5.2)
Sodium: 141 mmol/L (ref 134–144)

## 2020-02-22 LAB — MAGNESIUM: Magnesium: 2 mg/dL (ref 1.6–2.3)

## 2020-02-22 NOTE — Progress Notes (Signed)
Electrophysiology Office Note   Date:  02/22/2020   ID:  Logan, Sanders 1945/06/29, MRN [id number]  PCP:  Kelton Pillar, MD  Cardiologist:  Johnsie Cancel Primary Electrophysiologist:  Treshun Wold [name], MD    No chief complaint on file.    History of Present Illness: Logan Sanders is a 75 y.o. male who is being seen today for the evaluation of PVCs at the request of Kelton Pillar, MD. Presenting today for electrophysiology evaluation.  He has a history significant for paroxysmal atrial fibrillation, coronary artery disease, hypertension, hyperlipidemia, hypothyroidism, and PVCs. He had an attempted PVC ablation, though unfortunately PVCs were coming from around the RCA. He was not ablated and was started on sotalol.  Today, denies symptoms of palpitations, chest pain, shortness of breath, orthopnea, PND, lower extremity edema, claudication, dizziness, presyncope, syncope, bleeding, or neurologic sequela. The patient is tolerating medications without difficulties.  Since last being seen he is done well.  He has not noted any further episodes of atrial fibrillation or PVCs.  He does have intermittent palpitations, but these are quite infrequent.  He is able to do all of his daily activities and is without restriction.  Past Medical History:  Diagnosis Date  . BPH (benign prostatic hyperplasia)   . CAD in native artery   . Complication of anesthesia    gets really anxious when waking up  . ED (erectile dysfunction)   . Hyperlipidemia   . Hypertension   . Hypothyroidism   . Osteoarthritis   . PVC's (premature ventricular contractions)    3-day cardiac monitor 07/2018: NSR, frequent PVCs (PVC burden 15.9%); rare PACs and rare episode of non-sustained SVT  //  Echo 08/2018: EF 55-60, mild LVH, normal RV SF, mild LAE, mild dilation of aortic root and ascending aorta (38 mm)   . Thyroid disease    Past Surgical History:  Procedure Laterality Date  . CARDIAC CATHETERIZATION   11/09/13   non obstructive disease in all vessels.  . COLONOSCOPY    . KNEE ARTHROSCOPY Left    x3  . LEFT HEART CATHETERIZATION WITH CORONARY ANGIOGRAM N/A 11/09/2013   Procedure: LEFT HEART CATHETERIZATION WITH CORONARY ANGIOGRAM;  Surgeon: Blane Ohara, MD;  Location[facility] C[facility] LAB;  Service: Cardiovascular;  Laterality: N/A;  . LOWER BACK SURGERY  1985  . LUMBAR LAMINECTOMY/DECOMPRESSION MICRODISCECTOMY N/A 12/18/2017   Procedure: LAMINECTOMY AND FORAMINOTOMY LUMBAR TWO- LUMBAR THREE, LUMBAR THREE- LUMBAR FOUR, LUMBAR FOUR- LUMBAR FIVE;  Surgeon: Newman Pies, MD;  Location: [place];  Service: Neurosurgery;  Laterality: N/A;  . NECK SURGERY  1999  . PVC ABLATION N/A 10/07/2018   Procedure: PVC ABLATION;  Surgeon: Constance Haw, MD;  Location: [place] CV LAB;  Service: Cardiovascular;  Laterality: N/A;  . REPLACEMENT TOTAL KNEE  2005   LEFT KNEE  . WISDOM TOOTH EXTRACTION       Current Outpatient Medications  Medication Sig Dispense Refill  . albuterol (VENTOLIN HFA) 108 (90 Base) MCG/ACT inhaler Inhale 2 puffs into the lungs every 6 (six) hours as needed for wheezing or shortness of breath.     Marland Kitchen amLODipine (NORVASC) 5 MG tablet TAKE 1 TABLET ONCE DAILY. 30 tablet 10  . atorvastatin (LIPITOR) 40 MG tablet Take 1 tablet (40 mg total) by mouth daily. 90 tablet 3  . Cholecalciferol (D3-1000) 25 MCG (1000 UT) tablet Take 1,000 Units by mouth daily.    Marland Kitchen Co-Enzyme Q-10 100 MG CAPS Take 100 mg by mouth daily.     Marland Kitchen  Cyanocobalamin (VITAMIN B-12 PO) Take 1 tablet by mouth daily.    . diazepam (VALIUM) 5 MG tablet Take 5 mg by mouth as needed.    . doxazosin (CARDURA) 8 MG tablet Take 8 mg by mouth at bedtime.     Marland Kitchen ELIQUIS 5 MG TABS tablet TAKE 1 TABLET BY MOUTH TWICE DAILY. 60 tablet 5  . fexofenadine (ALLEGRA) 180 MG tablet Take 180 mg by mouth daily as needed for allergies.     . hydrochlorothiazide (HYDRODIURIL) 12.5 MG tablet Take 12.5 mg by mouth at bedtime.     .  multivitamin-iron-minerals-folic acid (CENTRUM) chewable tablet Chew 1 tablet by mouth daily.    Marland Kitchen olmesartan (BENICAR) 40 MG tablet Take 40 mg by mouth at bedtime.     . Omega-3 Fatty Acids (FISH OIL PO) Take 2,000 mg by mouth daily.     Marland Kitchen omeprazole (PRILOSEC) 40 MG capsule Take 40 mg by mouth daily as needed (for acid reflux).    . potassium chloride (KLOR-CON) 10 MEQ tablet TAKE 1 TABLET EACH DAY. 90 tablet 3  . sotalol (BETAPACE) 80 MG tablet Take 1 tablet (80 mg total) by mouth 2 (two) times daily. 180 tablet 3  . SYNTHROID 100 MCG tablet Take 100 mcg by mouth daily before breakfast.     . traZODone (DESYREL) 50 MG tablet Take 50 mg by mouth at bedtime.    Marland Kitchen zolpidem (AMBIEN) 10 MG tablet Take 10 mg by mouth at bedtime.     No current facility-administered medications for this visit.    Allergies:   Patient has no known allergies.   Social History:  The patient  reports that he quit smoking about 20 years ago. He has a 2.50 pack-year smoking history. He quit smokeless tobacco use about 24 years ago. He reports current alcohol use of about 1.0 standard drink of alcohol per week. He reports that he does not use drugs.   Family History:  The patient's family history includes Asthma in his mother; Heart attack in his father; Heart disease in his father; Hyperlipidemia in his brother; Parkinsonism in his mother; Prostate cancer in his father.   ROS:  Please see the history of present illness.   Otherwise, review of systems is positive for none.   All other systems are reviewed and negative.   PHYSICAL EXAM: VS:  BP 134/66   Pulse (!) 58   Ht 5\' 9"  (1.753 m)   Wt 222 lb 9.6 oz (101 kg)   SpO2 97%   BMI 32.87 kg/m  , BMI Body mass index is 32.87 kg/m. GEN: Well nourished, well developed, in no acute distress  HEENT: normal  Neck: no JVD, carotid bruits, or masses Cardiac: RRR; no murmurs, rubs, or gallops,no edema  Respiratory:  clear to auscultation bilaterally, normal work of  breathing GI: soft, nontender, nondistended, + BS MS: no deformity or atrophy  Skin: warm and dry Neuro:  Strength and sensation are intact Psych: euthymic mood, full affect  EKG:  EKG is ordered today. Personal review of the ekg ordered shows sinus rhythm, rate 58  Recent Labs: No results found for requested labs within last 8760 hours.    Lipid Panel     Component Value Date/Time   CHOL 113 07/19/2017 0755   TRIG 68 07/19/2017 0755   HDL 38 (L) 07/19/2017 0755   CHOLHDL 3.0 07/19/2017 0755   CHOLHDL 2.6 07/28/2015 1042   VLDL 17 07/28/2015 1042   LDLCALC 61 07/19/2017 0755  Wt Readings from Last 3 Encounters:  02/22/20 222 lb 9.6 oz (101 kg)  11/30/19 224 lb 6.4 oz (101.8 kg)  08/25/19 225 lb 12.8 oz (102.4 kg)      Other studies Reviewed: Additional studies/ records that were reviewed today include: TTE 08/04/18  Review of the above records today demonstrates:   1. The left ventricle has normal systolic function, with an ejection fraction of 55-60%. The cavity size was normal. There is mildly increased left ventricular wall thickness. Left ventricular diastolic parameters were normal.  2. The right ventricle has normal systolic function. The cavity was normal.  3. Left atrial size was mildly dilated.  4. The mitral valve is abnormal. Mild thickening of the mitral valve leaflet.  5. The tricuspid valve is grossly normal.  6. The aortic valve is tricuspid. Mild thickening of the aortic valve. No stenosis of the aortic valve.  7. The aorta is abnormal in size and structure.  8. There is mild dilatation of the aortic root and of the ascending aorta measuring 38 mm.  9. Normal LV function; mild LVH; mild LAE.    ASSESSMENT AND PLAN:  1. PVCs: Burden of 15%. Ablation attempt 10/07/2018. Unfortunately they were coming from around the RCA and incomplete ablation was performed. He is currently on sotalol.  He has had minimal symptoms.  No changes.  2. Paroxysmal atrial  fibrillation: Currently on Eliquis and sotalol. CHA2DS2-VASc of 3. High risk medication monitoring.  Remains in sinus rhythm.   3. Hypertension: Currently well controlled  4. Coronary artery disease: Nonobstructive by catheterization in 2015. Currently on aspirin and Eliquis.   Current medicines are reviewed at length with the patient today.   The patient does not have concerns regarding his medicines.  The following changes were made today: None  Labs/ tests ordered today include:  Orders Placed This Encounter  Procedures  . Basic metabolic panel  . Magnesium  . EKG 12-Lead   Disposition:   FU with Xzavion Doswell 6 months  Signed, Demir Titsworth [name], MD  02/22/2020 10:52 AM     [facility] [address] [place] [address] [phone] ([phone] (fax)

## 2020-02-22 NOTE — Patient Instructions (Signed)
Medication Instructions:  Your physician recommends that you continue on your current medications as directed. Please refer to the Current Medication list given to you today.  *If you need a refill on your cardiac medications before your next appointment, please call your pharmacy*   Lab Work: Sotalol labs today: BMET & Magnesium If you have labs (blood work) drawn today and your tests are completely normal, you will receive your results only by: Marland Kitchen MyChart Message (if you have MyChart) OR . A paper copy in the mail If you have any lab test that is abnormal or we need to change your treatment, we will call you to review the results.   Testing/Procedures: None ordered   Follow-Up: At [facility], you and your health needs are our priority.  As part of our continuing mission to provide you with exceptional heart care, we have created designated Provider Care Teams.  These Care Teams include your primary Cardiologist (physician) and Advanced Practice Providers (APPs -  Physician Assistants and Nurse Practitioners) who all work together to provide you with the care you need, when you need it.  Your next appointment:   6 month(s)  The format for your next appointment:   In Person  Provider:   Allegra Lai, MD    Thank you for choosing [facility]!!   Trinidad Curet, RN [phone]

## 2020-04-26 DIAGNOSIS — Z1389 Encounter for screening for other disorder: Secondary | ICD-10-CM | POA: Diagnosis not present

## 2020-04-26 DIAGNOSIS — Z23 Encounter for immunization: Secondary | ICD-10-CM | POA: Diagnosis not present

## 2020-04-26 DIAGNOSIS — J309 Allergic rhinitis, unspecified: Secondary | ICD-10-CM | POA: Diagnosis not present

## 2020-04-26 DIAGNOSIS — Z0000 Encounter for general adult medical examination without abnormal findings: Secondary | ICD-10-CM | POA: Diagnosis not present

## 2020-04-26 DIAGNOSIS — I129 Hypertensive chronic kidney disease with stage 1 through stage 4 chronic kidney disease, or unspecified chronic kidney disease: Secondary | ICD-10-CM | POA: Diagnosis not present

## 2020-04-26 DIAGNOSIS — R7303 Prediabetes: Secondary | ICD-10-CM | POA: Diagnosis not present

## 2020-04-26 DIAGNOSIS — E7800 Pure hypercholesterolemia, unspecified: Secondary | ICD-10-CM | POA: Diagnosis not present

## 2020-04-26 DIAGNOSIS — E039 Hypothyroidism, unspecified: Secondary | ICD-10-CM | POA: Diagnosis not present

## 2020-04-26 DIAGNOSIS — I480 Paroxysmal atrial fibrillation: Secondary | ICD-10-CM | POA: Diagnosis not present

## 2020-04-26 DIAGNOSIS — G479 Sleep disorder, unspecified: Secondary | ICD-10-CM | POA: Diagnosis not present

## 2020-05-23 DIAGNOSIS — R0981 Nasal congestion: Secondary | ICD-10-CM | POA: Diagnosis not present

## 2020-05-23 DIAGNOSIS — J069 Acute upper respiratory infection, unspecified: Secondary | ICD-10-CM | POA: Diagnosis not present

## 2020-05-23 DIAGNOSIS — Z03818 Encounter for observation for suspected exposure to other biological agents ruled out: Secondary | ICD-10-CM | POA: Diagnosis not present

## 2020-05-23 DIAGNOSIS — R059 Cough, unspecified: Secondary | ICD-10-CM | POA: Diagnosis not present

## 2020-06-28 DIAGNOSIS — M25512 Pain in left shoulder: Secondary | ICD-10-CM | POA: Diagnosis not present

## 2020-06-28 DIAGNOSIS — M75112 Incomplete rotator cuff tear or rupture of left shoulder, not specified as traumatic: Secondary | ICD-10-CM | POA: Diagnosis not present

## 2020-07-11 ENCOUNTER — Other Ambulatory Visit: Payer: Self-pay | Admitting: Cardiovascular Disease

## 2020-07-11 DIAGNOSIS — I2510 Atherosclerotic heart disease of native coronary artery without angina pectoris: Secondary | ICD-10-CM

## 2020-07-11 DIAGNOSIS — I10 Essential (primary) hypertension: Secondary | ICD-10-CM

## 2020-07-12 NOTE — Telephone Encounter (Signed)
Prescription refill request for Eliquis received. Indication: PAF Last office visit: 02/22/20  [place] MD Scr: 1.25 on 02/22/20 Age: 75 Weight: 101kg  Based on above findings Eliquis 5mg  twice daily is the appropriate dose.  Refill approved.

## 2020-07-13 DIAGNOSIS — M25552 Pain in left hip: Secondary | ICD-10-CM | POA: Diagnosis not present

## 2020-07-13 DIAGNOSIS — M25551 Pain in right hip: Secondary | ICD-10-CM | POA: Diagnosis not present

## 2020-07-14 DIAGNOSIS — R3915 Urgency of urination: Secondary | ICD-10-CM | POA: Diagnosis not present

## 2020-07-19 DIAGNOSIS — R3915 Urgency of urination: Secondary | ICD-10-CM | POA: Diagnosis not present

## 2020-07-19 DIAGNOSIS — M6281 Muscle weakness (generalized): Secondary | ICD-10-CM | POA: Diagnosis not present

## 2020-07-19 DIAGNOSIS — M6289 Other specified disorders of muscle: Secondary | ICD-10-CM | POA: Diagnosis not present

## 2020-07-19 DIAGNOSIS — M62838 Other muscle spasm: Secondary | ICD-10-CM | POA: Diagnosis not present

## 2020-08-01 DIAGNOSIS — M6281 Muscle weakness (generalized): Secondary | ICD-10-CM | POA: Diagnosis not present

## 2020-08-01 DIAGNOSIS — R3915 Urgency of urination: Secondary | ICD-10-CM | POA: Diagnosis not present

## 2020-08-01 DIAGNOSIS — M62838 Other muscle spasm: Secondary | ICD-10-CM | POA: Diagnosis not present

## 2020-08-01 DIAGNOSIS — M6289 Other specified disorders of muscle: Secondary | ICD-10-CM | POA: Diagnosis not present

## 2020-08-17 DIAGNOSIS — K648 Other hemorrhoids: Secondary | ICD-10-CM | POA: Diagnosis not present

## 2020-08-24 ENCOUNTER — Telehealth: Payer: Self-pay | Admitting: *Deleted

## 2020-08-24 DIAGNOSIS — Z79899 Other long term (current) drug therapy: Secondary | ICD-10-CM

## 2020-08-24 NOTE — Telephone Encounter (Signed)
Patient with diagnosis of atrial fibrillation on Eliquis for anticoagulation.    Procedure: colonoscopy Date of procedure: 09/20/20   CHA2DS2-VASc Score = 4  This indicates a 4.8% annual risk of stroke. The patient's score is based upon: CHF History: Yes HTN History: Yes Diabetes History: No Stroke History: No Vascular Disease History: Yes Age Score: 1 Gender Score: 0    CrCl 62 (with adjusted body weight)  Last CBC was in 2020.  Will need to have that drawn to determine clearance

## 2020-08-24 NOTE — Telephone Encounter (Signed)
Can we set this patient up for CBC? Looks like the patient has not had a CBC since 2020 and will need this updated prior to [facility] holding recommendations  Thank you

## 2020-08-24 NOTE — Telephone Encounter (Signed)
Pt is scheduled to come in for lab work tomorrow.

## 2020-08-24 NOTE — Telephone Encounter (Signed)
   [place] Pre-operative Risk Assessment    Patient Name: Logan Sanders  DOB: [birth year] MRN: [id number]  HEARTCARE STAFF:  - IMPORTANT!!!!!! Under Visit Info/Reason for Call, type in Other and utilize the format Clearance MM/DD/YY or Clearance TBD. Do not use dashes or single digits. - Please review there is not already an duplicate clearance open for this procedure. - If request is for dental extraction, please clarify the # of teeth to be extracted. - If the patient is currently at the dentist's office, call Pre-Op Callback Staff (MA/nurse) to input urgent request.  - If the patient is not currently in the dentist office, please route to the Pre-Op pool.  Request for surgical clearance:  What type of surgery is being performed?  COLONOSCOPY  When is this surgery scheduled?  09/20/2020  What type of clearance is required (medical clearance vs. Pharmacy clearance to hold med vs. Both)?  PHARMACY  Are there any medications that need to be held prior to surgery and how long?  [place] name and name of physician performing surgery?  [facility] / DR. MEDOFF  What is the office phone number?  [phone]   7.   What is the office fax number?  [phone]  8.   Anesthesia type (None, local, MAC, general) ?     Logan Sanders 08/24/2020, 12:38 PM  _________________________________________________________________   (provider comments below)

## 2020-08-25 ENCOUNTER — Other Ambulatory Visit: Payer: Medicare Other | Admitting: *Deleted

## 2020-08-25 ENCOUNTER — Other Ambulatory Visit: Payer: Self-pay

## 2020-08-25 DIAGNOSIS — Z79899 Other long term (current) drug therapy: Secondary | ICD-10-CM | POA: Diagnosis not present

## 2020-08-25 LAB — CBC
Hematocrit: 42.2 % (ref 37.5–51.0)
Hemoglobin: 14.7 g/dL (ref 13.0–17.7)
MCH: 30 pg (ref 26.6–33.0)
MCHC: 34.8 g/dL (ref 31.5–35.7)
MCV: 86 fL (ref 79–97)
Platelets: 220 10*3/uL (ref 150–450)
RBC: 4.9 x10E6/uL (ref 4.14–5.80)
RDW: 14.1 % (ref 11.6–15.4)
WBC: 7.5 10*3/uL (ref 3.4–10.8)

## 2020-08-26 NOTE — Telephone Encounter (Signed)
Patient may hold Eliquis for 2 days prior to procedure.

## 2020-08-26 NOTE — Telephone Encounter (Signed)
    Patient Name: Logan Sanders  DOB: November 09, 1945 MRN: [id number]  Primary Cardiologist: Mertie Moores, MD  Chart reviewed as part of pre-operative protocol coverage. Given past medical history and time since last visit, based on ACC/AHA guidelines, [name] would be at acceptable risk for the planned procedure without further cardiovascular testing.   Patient with diagnosis of atrial fibrillation on Eliquis for anticoagulation.     Procedure: colonoscopy Date of procedure: 09/20/20   CHA2DS2-VASc Score = 4  This indicates a 4.8% annual risk of stroke. The patient's score is based upon: CHF History: Yes HTN History: Yes Diabetes History: No Stroke History: No Vascular Disease History: Yes Age Score: 1 Gender Score: 0    The patient may hold Eliquis for 2 days prior to procedure.   I will route this recommendation to the requesting party via Epic fax function and remove from pre-op pool.  Please call with questions.  Kathyrn Drown, NP 08/26/2020, 2:27 PM

## 2020-09-01 ENCOUNTER — Encounter: Payer: Self-pay | Admitting: Cardiology

## 2020-09-01 ENCOUNTER — Other Ambulatory Visit: Payer: Self-pay

## 2020-09-01 ENCOUNTER — Ambulatory Visit: Payer: Medicare Other | Admitting: Cardiology

## 2020-09-01 VITALS — BP 142/80 | HR 56 | Ht 69.0 in | Wt 223.0 lb

## 2020-09-01 DIAGNOSIS — I493 Ventricular premature depolarization: Secondary | ICD-10-CM

## 2020-09-01 NOTE — Progress Notes (Signed)
Electrophysiology Office Note   Date:  09/01/2020   ID:  Logan Sanders, DOB 1945/12/03, MRN [id number]  PCP:  Kelton Pillar, MD  Cardiologist:  Johnsie Cancel Primary Electrophysiologist:  Tacuma Graffam [name], MD    No chief complaint on file.    History of Present Illness: Logan Sanders is a 75 y.o. male who is being seen today for the evaluation of PVCs at the request of Kelton Pillar, MD. Presenting today for electrophysiology evaluation.  He has a history significant for paroxysmal atrial fibrillation, coronary artery disease, hypertension, hyperlipidemia, hypothyroidism, and PVCs.  He had attempted PVC ablation but unfortunately his PVCs were coming from quite close to the RCA.  He was not ablated and was started on sotalol.  Today, denies symptoms of palpitations, chest pain, shortness of breath, orthopnea, PND, lower extremity edema, claudication, dizziness, presyncope, syncope, bleeding, or neurologic sequela. The patient is tolerating medications without difficulties.  He is currently feeling well.  He is unaware of any further PVCs.  He is able to do all of his daily activities.  He remains quite active.  Past Medical History:  Diagnosis Date   BPH (benign prostatic hyperplasia)    CAD in native artery    Complication of anesthesia    gets really anxious when waking up   ED (erectile dysfunction)    Hyperlipidemia    Hypertension    Hypothyroidism    Osteoarthritis    PVC's (premature ventricular contractions)    3-day cardiac monitor 07/2018: NSR, frequent PVCs (PVC burden 15.9%); rare PACs and rare episode of non-sustained SVT  //  Echo 08/2018: EF 55-60, mild LVH, normal RV SF, mild LAE, mild dilation of aortic root and ascending aorta (38 mm)    Thyroid disease    Past Surgical History:  Procedure Laterality Date   CARDIAC CATHETERIZATION  11/09/13   non obstructive disease in all vessels.   COLONOSCOPY     KNEE ARTHROSCOPY Left    x3   LEFT HEART  CATHETERIZATION WITH CORONARY ANGIOGRAM N/A 11/09/2013   Procedure: LEFT HEART CATHETERIZATION WITH CORONARY ANGIOGRAM;  Surgeon: Blane Ohara, MD;  Location: [facility] CATH LAB;  Service: Cardiovascular;  Laterality: N/A;   LOWER BACK SURGERY  1985   LUMBAR LAMINECTOMY/DECOMPRESSION MICRODISCECTOMY N/A 12/18/2017   Procedure: LAMINECTOMY AND FORAMINOTOMY LUMBAR TWO- LUMBAR THREE, LUMBAR THREE- LUMBAR FOUR, LUMBAR FOUR- LUMBAR FIVE;  Surgeon: Newman Pies, MD;  Location: [place];  Service: Neurosurgery;  Laterality: N/A;   NECK SURGERY  1999   PVC ABLATION N/A 10/07/2018   Procedure: PVC ABLATION;  Surgeon: Constance Haw, MD;  Location: [place] CV LAB;  Service: Cardiovascular;  Laterality: N/A;   REPLACEMENT TOTAL KNEE  2005   LEFT KNEE   WISDOM TOOTH EXTRACTION       Current Outpatient Medications  Medication Sig Dispense Refill   albuterol (VENTOLIN HFA) 108 (90 Base) MCG/ACT inhaler Inhale 2 puffs into the lungs every 6 (six) hours as needed for wheezing or shortness of breath.      amLODipine (NORVASC) 5 MG tablet TAKE 1 TABLET ONCE DAILY. 30 tablet 10   atorvastatin (LIPITOR) 40 MG tablet Take 1 tablet (40 mg total) by mouth daily. 90 tablet 3   Cholecalciferol (D3-1000) 25 MCG (1000 UT) tablet Take 1,000 Units by mouth daily.     Co-Enzyme Q-10 100 MG CAPS Take 100 mg by mouth daily.      Cyanocobalamin (VITAMIN B-12 PO) Take 1 tablet by mouth daily.  diazepam (VALIUM) 5 MG tablet Take 5 mg by mouth as needed.     doxazosin (CARDURA) 8 MG tablet Take 8 mg by mouth at bedtime.      ELIQUIS 5 MG TABS tablet TAKE 1 TABLET BY MOUTH TWICE DAILY. 60 tablet 5   fexofenadine (ALLEGRA) 180 MG tablet Take 180 mg by mouth daily as needed for allergies.      hydrochlorothiazide (HYDRODIURIL) 12.5 MG tablet Take 12.5 mg by mouth at bedtime.      multivitamin-iron-minerals-folic acid (CENTRUM) chewable tablet Chew 1 tablet by mouth daily.     olmesartan (BENICAR) 40 MG tablet Take 40  mg by mouth at bedtime.      Omega-3 Fatty Acids (FISH OIL PO) Take 2,000 mg by mouth daily.      omeprazole (PRILOSEC) 40 MG capsule Take 40 mg by mouth daily as needed (for acid reflux).     potassium chloride (KLOR-CON) 10 MEQ tablet TAKE 1 TABLET EACH DAY. 90 tablet 3   sotalol (BETAPACE) 80 MG tablet Take 1 tablet (80 mg total) by mouth 2 (two) times daily. 180 tablet 3   SYNTHROID 100 MCG tablet Take 100 mcg by mouth daily before breakfast.      traZODone (DESYREL) 50 MG tablet Take 50 mg by mouth at bedtime.     zolpidem (AMBIEN) 10 MG tablet Take 10 mg by mouth at bedtime.     No current facility-administered medications for this visit.    Allergies:   Patient has no known allergies.   Social History:  The patient  reports that he quit smoking about 21 years ago. His smoking use included cigarettes. He has a 2.50 pack-year smoking history. He quit smokeless tobacco use about 25 years ago. He reports current alcohol use of about 1.0 standard drink per week. He reports that he does not use drugs.   Family History:  The patient's family history includes Asthma in his mother; Heart attack in his father; Heart disease in his father; Hyperlipidemia in his brother; Parkinsonism in his mother; Prostate cancer in his father.   ROS:  Please see the history of present illness.   Otherwise, review of systems is positive for none.   All other systems are reviewed and negative.   PHYSICAL EXAM: VS:  BP (!) 142/80   Pulse (!) 56   Ht '5\' 9"'$  (1.753 m)   Wt 223 lb (101.2 kg)   SpO2 98%   BMI 32.93 kg/m  , BMI Body mass index is 32.93 kg/m. GEN: Well nourished, well developed, in no acute distress  HEENT: normal  Neck: no JVD, carotid bruits, or masses Cardiac: RRR; no murmurs, rubs, or gallops,no edema  Respiratory:  clear to auscultation bilaterally, normal work of breathing GI: soft, nontender, nondistended, + BS MS: no deformity or atrophy  Skin: warm and dry Neuro:  Strength and  sensation are intact Psych: euthymic mood, full affect  EKG:  EKG is ordered today. Personal review of the ekg ordered shows sinus rhythm, rate 59  Recent Labs: 02/22/2020: BUN 17; Creatinine, Ser 1.25; Magnesium 2.0; Potassium 3.8; Sodium 141 08/25/2020: Hemoglobin 14.7; Platelets 220    Lipid Panel     Component Value Date/Time   CHOL 113 07/19/2017 0755   TRIG 68 07/19/2017 0755   HDL 38 (L) 07/19/2017 0755   CHOLHDL 3.0 07/19/2017 0755   CHOLHDL 2.6 07/28/2015 1042   VLDL 17 07/28/2015 1042   LDLCALC 61 07/19/2017 0755     Wt Readings  from Last 3 Encounters:  09/01/20 223 lb (101.2 kg)  02/22/20 222 lb 9.6 oz (101 kg)  11/30/19 224 lb 6.4 oz (101.8 kg)      Other studies Reviewed: Additional studies/ records that were reviewed today include: TTE 08/04/18  Review of the above records today demonstrates:   1. The left ventricle has normal systolic function, with an ejection fraction of 55-60%. The cavity size was normal. There is mildly increased left ventricular wall thickness. Left ventricular diastolic parameters were normal.  2. The right ventricle has normal systolic function. The cavity was normal.  3. Left atrial size was mildly dilated.  4. The mitral valve is abnormal. Mild thickening of the mitral valve leaflet.  5. The tricuspid valve is grossly normal.  6. The aortic valve is tricuspid. Mild thickening of the aortic valve. No stenosis of the aortic valve.  7. The aorta is abnormal in size and structure.  8. There is mild dilatation of the aortic root and of the ascending aorta measuring 38 mm.  9. Normal LV function; mild LVH; mild LAE.     ASSESSMENT AND PLAN:  1.  PVCs: Burden of 15%.  Ablation attempt 10/07/2018.  Unfortunately were coming from around the RCA and incomplete ablation was performed.  He is currently on sotalol with minimal symptoms.  No changes.  High risk medication monitoring.  2.  Paroxysmal atrial fibrillation: Currently on Eliquis and  sotalol.  CHA2DS2-VASc of 3.  Remains in sinus rhythm.  3.  Hypertension: Mildly elevated today.  He is yet to take his medications this morning.  No changes.  4.  Coronary artery disease: Nonobstructive by catheterization in 2015.  Currently on Eliquis and aspirin.  Current medicines are reviewed at length with the patient today.   The patient does not have concerns regarding his medicines.  The following changes were made today: None  Labs/ tests ordered today include:  Orders Placed This Encounter  Procedures   EKG 12-Lead    Disposition:   FU with Tressie Ragin 12 months  Signed, Roald Lukacs [name], MD  09/01/2020 9:21 AM     [address] [place] [address] [phone] (office) [phone] (fax)

## 2020-09-01 NOTE — Patient Instructions (Signed)
Medication Instructions:  Your physician recommends that you continue on your current medications as directed. Please refer to the Current Medication list given to you today.  *If you need a refill on your cardiac medications before your next appointment, please call your pharmacy*   Lab Work: None Ordered If you have labs (blood work) drawn today and your tests are completely normal, you will receive your results only by: [place] (if you have MyChart) OR A paper copy in the mail If you have any lab test that is abnormal or we need to change your treatment, we will call you to review the results.   Testing/Procedures: None Ordered   Follow-Up: At [facility], you and your health needs are our priority.  As part of our continuing mission to provide you with exceptional heart care, we have created designated Provider Care Teams.  These Care Teams include your primary Cardiologist (physician) and Advanced Practice Providers (APPs -  Physician Assistants and Nurse Practitioners) who all work together to provide you with the care you need, when you need it.    Your next appointment:   9 month(s)  The format for your next appointment:   In Person  Provider:   You may see Will [name], MD or one of the following Advanced Practice Providers on your designated Care Team:   Tommye Standard, [place] Legrand Como "[facility]" [place], [place]

## 2020-09-19 ENCOUNTER — Telehealth: Payer: Self-pay | Admitting: Cardiology

## 2020-09-19 NOTE — Telephone Encounter (Signed)
pt needs a note from Dr. Curt Bears saying that it is ok for the pt to drive.. please advise.

## 2020-09-19 NOTE — Telephone Encounter (Signed)
Pt aware will discuss with Dr. Curt Bears and let him know. Letter needs to go to [facility] in [place].

## 2020-09-20 DIAGNOSIS — D120 Benign neoplasm of cecum: Secondary | ICD-10-CM | POA: Diagnosis not present

## 2020-09-20 DIAGNOSIS — K625 Hemorrhage of anus and rectum: Secondary | ICD-10-CM | POA: Diagnosis not present

## 2020-09-20 DIAGNOSIS — K5730 Diverticulosis of large intestine without perforation or abscess without bleeding: Secondary | ICD-10-CM | POA: Diagnosis not present

## 2020-09-20 DIAGNOSIS — K635 Polyp of colon: Secondary | ICD-10-CM | POA: Diagnosis not present

## 2020-09-20 DIAGNOSIS — K648 Other hemorrhoids: Secondary | ICD-10-CM | POA: Diagnosis not present

## 2020-09-21 ENCOUNTER — Telehealth: Payer: Self-pay | Admitting: *Deleted

## 2020-09-21 NOTE — Telephone Encounter (Signed)
   [place] Pre-operative Risk Assessment    Patient Name: Logan Sanders  DOB: Apr 27, 1945 MRN: [id number]  HEARTCARE STAFF:  - IMPORTANT!!!!!! Under Visit Info/Reason for Call, type in Other and utilize the format Clearance MM/DD/YY or Clearance TBD. Do not use dashes or single digits. - Please review there is not already an duplicate clearance open for this procedure. - If request is for dental extraction, please clarify the # of teeth to be extracted. - If the patient is currently at the dentist's office, call Pre-Op Callback Staff (MA/nurse) to input urgent request.  - If the patient is not currently in the dentist office, please route to the Pre-Op pool.  Request for surgical clearance:  What type of surgery is being performed?  HEMORRHOIDAL BANDING   When is this surgery scheduled?  TBD  What type of clearance is required (medical clearance vs. Pharmacy clearance to hold med vs. Both)?  BOTH  Are there any medications that need to be held prior to surgery and how long?  ELISUIS, PER NOTE:  In order to have this procedure done, the pt must be off of blood thinning medication prior to and after the procedure to help minimize the risk of bleeding.  Bleeding may occur 2-5 days after band is placed on the hemorrhoidal tissue.  In order to mitigate bleeding risks, we would ask for your permission to discontinue Eliquis while providing these services.  This procedure takes 3 separate visits which are typically separated 2-3 weeks in between.  Practice name and name of physician performing surgery?  [facility]  / DR. MEDOFF    What is the office phone number?   [phone]   7.   What is the office fax number?  [phone]  8.   Anesthesia type (None, local, MAC, general) ?    Jeanann Lewandowsky 09/21/2020, 1:28 PM  _________________________________________________________________   (provider comments below)

## 2020-09-21 NOTE — Telephone Encounter (Signed)
Covering preop today. Will route to pharm for input - will likely need MD clearance involved for prolonged holding - then patient will need call. Last OV 09/01/20, doing well.

## 2020-09-22 ENCOUNTER — Encounter: Payer: Self-pay | Admitting: *Deleted

## 2020-09-22 NOTE — Telephone Encounter (Signed)
Patient with diagnosis of afib on Eliquis for anticoagulation.    Procedure: hemorrhoidal banding Date of procedure: TBD  CHA2DS2-VASc Score = 3  This indicates a 3.2% annual risk of stroke. The patient's score is based upon: CHF History: 0 HTN History: 1 Diabetes History: 0 Stroke History: 0 Vascular Disease History: 1 Age Score: 1 Gender Score: 0   Of note, CHF is listed on his problem list, however most recent echo from 2020 states EF is normal and that low EF from stress test was inaccurate, will not count this as a risk factor.  CrCl 58mL/min Platelet count 220K  Recommend holding Eliquis for 1-2 days prior to procedure and ok to delay resuming anticoag for ~2 days afterwards. If they want a full 5 day hold after procedure, would need MD clearance, especially since this procedure will be performed 3 separate times and Eliquis will be held for each of them.

## 2020-09-22 NOTE — Progress Notes (Signed)
Pt aware that I spoke with [facility] in [place]. Aware letter being generated now and will faxed to them today. Pt appreciate of plan/help.

## 2020-09-22 NOTE — Telephone Encounter (Signed)
Pt aware letter being faxed today.

## 2020-09-22 NOTE — Telephone Encounter (Signed)
    Patient Name: Logan Sanders  DOB: 10/11/1945 MRN: [id number]  Primary Cardiologist: Mertie Moores, MD  Chart reviewed as part of pre-operative protocol coverage. Given past medical history and time since last visit, based on ACC/AHA guidelines, RHEA THRUN would be at acceptable risk for the planned procedure without further cardiovascular testing.   I will route this recommendation to the requesting party via Epic fax function and remove from pre-op pool.  Please call with questions.  [place], [place] 09/22/2020, 12:11 PM

## 2020-10-26 DIAGNOSIS — H9319 Tinnitus, unspecified ear: Secondary | ICD-10-CM | POA: Diagnosis not present

## 2020-10-26 DIAGNOSIS — E7800 Pure hypercholesterolemia, unspecified: Secondary | ICD-10-CM | POA: Diagnosis not present

## 2020-10-26 DIAGNOSIS — M25559 Pain in unspecified hip: Secondary | ICD-10-CM | POA: Diagnosis not present

## 2020-10-26 DIAGNOSIS — R7303 Prediabetes: Secondary | ICD-10-CM | POA: Diagnosis not present

## 2020-10-26 DIAGNOSIS — Z23 Encounter for immunization: Secondary | ICD-10-CM | POA: Diagnosis not present

## 2020-10-26 DIAGNOSIS — E039 Hypothyroidism, unspecified: Secondary | ICD-10-CM | POA: Diagnosis not present

## 2020-10-26 DIAGNOSIS — I2510 Atherosclerotic heart disease of native coronary artery without angina pectoris: Secondary | ICD-10-CM | POA: Diagnosis not present

## 2020-10-26 DIAGNOSIS — I129 Hypertensive chronic kidney disease with stage 1 through stage 4 chronic kidney disease, or unspecified chronic kidney disease: Secondary | ICD-10-CM | POA: Diagnosis not present

## 2020-10-26 DIAGNOSIS — N1831 Chronic kidney disease, stage 3a: Secondary | ICD-10-CM | POA: Diagnosis not present

## 2020-10-26 DIAGNOSIS — J309 Allergic rhinitis, unspecified: Secondary | ICD-10-CM | POA: Diagnosis not present

## 2020-12-07 DIAGNOSIS — M1711 Unilateral primary osteoarthritis, right knee: Secondary | ICD-10-CM | POA: Diagnosis not present

## 2020-12-07 DIAGNOSIS — M25561 Pain in right knee: Secondary | ICD-10-CM | POA: Diagnosis not present

## 2020-12-07 DIAGNOSIS — K648 Other hemorrhoids: Secondary | ICD-10-CM | POA: Diagnosis not present

## 2020-12-07 DIAGNOSIS — M2391 Unspecified internal derangement of right knee: Secondary | ICD-10-CM | POA: Diagnosis not present

## 2020-12-19 ENCOUNTER — Ambulatory Visit: Payer: Medicare Other | Admitting: Physician Assistant

## 2020-12-26 ENCOUNTER — Other Ambulatory Visit: Payer: Self-pay | Admitting: Cardiovascular Disease

## 2021-01-09 NOTE — Progress Notes (Deleted)
Cardiology Office Note:    Date:  01/10/2021   ID:  Logan Sanders, DOB 05/13/1945, MRN [id number]  PCP:  Kelton Pillar, MD   [facility] Providers Cardiologist:  Mertie Moores, MD Electrophysiologist:  Will [name], MD     Referring MD: Kelton Pillar, MD   Chief Complaint: 1 year f/u PAF, PVCs, CAD  History of Present Illness:    Logan Sanders is a 75 y.o. male with a hx of CAD, PAF, chronic HFrEF, HTN, hyperlipidemia, and symptomatic PVCs.  He had abnormal myoview in 11/15 which revealed intermediate risk study with mild lateral wall ischemia and mildly depressed LVEF 47%. He underwent LHC which revealed diffusely calcified coronary arteries with only mild nonobstructive CAD, normal left ventricular systolic function, nl LVEDP.   He had a lexiscan myoview 7/20 which revealed severely depressed LVEF < 30%, felt to be due to frequent PVCs. Echo was done for follow-up which revealed nl LVEF 55-60%, mildly increased LVH, mildly dilated LA, mild valvular dysfunction.   Previously attempted PVC ablation but his PVCs were coming from quite close to RCA and unfortunately he was not ablated. He was started on sotalol. He was last seen by Dr. Curt Bears 9/22 and by Dr. Acie Fredrickson 11/21  Past Medical History:  Diagnosis Date   BPH (benign prostatic hyperplasia)    CAD in native artery    Complication of anesthesia    gets really anxious when waking up   ED (erectile dysfunction)    Hyperlipidemia    Hypertension    Hypothyroidism    Osteoarthritis    PVC's (premature ventricular contractions)    3-day cardiac monitor 07/2018: NSR, frequent PVCs (PVC burden 15.9%); rare PACs and rare episode of non-sustained SVT  //  Echo 08/2018: EF 55-60, mild LVH, normal RV SF, mild LAE, mild dilation of aortic root and ascending aorta (38 mm)    Thyroid disease     Past Surgical History:  Procedure Laterality Date   CARDIAC CATHETERIZATION  11/09/13   non obstructive disease in all  vessels.   COLONOSCOPY     KNEE ARTHROSCOPY Left    x3   LEFT HEART CATHETERIZATION WITH CORONARY ANGIOGRAM N/A 11/09/2013   Procedure: LEFT HEART CATHETERIZATION WITH CORONARY ANGIOGRAM;  Surgeon: Blane Ohara, MD;  Location: [facility] C[facility] LAB;  Service: Cardiovascular;  Laterality: N/A;   LOWER BACK SURGERY  1985   LUMBAR LAMINECTOMY/DECOMPRESSION MICRODISCECTOMY N/A 12/18/2017   Procedure: LAMINECTOMY AND FORAMINOTOMY LUMBAR TWO- LUMBAR THREE, LUMBAR THREE- LUMBAR FOUR, LUMBAR FOUR- LUMBAR FIVE;  Surgeon: Newman Pies, MD;  Location: [place];  Service: Neurosurgery;  Laterality: N/A;   NECK SURGERY  1999   PVC ABLATION N/A 10/07/2018   Procedure: PVC ABLATION;  Surgeon: Constance Haw, MD;  Location: [place] CV LAB;  Service: Cardiovascular;  Laterality: N/A;   REPLACEMENT TOTAL KNEE  2005   LEFT KNEE   WISDOM TOOTH EXTRACTION      Current Medications: Current Meds  Medication Sig   amLODipine (NORVASC) 5 MG tablet TAKE 1 TABLET ONCE DAILY.   atorvastatin (LIPITOR) 40 MG tablet Take 1 tablet (40 mg total) by mouth daily.   Cholecalciferol (D3-1000) 25 MCG (1000 UT) tablet Take 1,000 Units by mouth daily.   Co-Enzyme Q-10 100 MG CAPS Take 100 mg by mouth daily.    Cyanocobalamin (VITAMIN B-12 PO) Take 1 tablet by mouth daily.   diazepam (VALIUM) 5 MG tablet Take 5 mg by mouth as needed.   ELIQUIS 5  MG TABS tablet TAKE 1 TABLET BY MOUTH TWICE DAILY.   fexofenadine (ALLEGRA) 180 MG tablet Take 180 mg by mouth daily as needed for allergies.    hydrochlorothiazide (HYDRODIURIL) 12.5 MG tablet Take 12.5 mg by mouth at bedtime.    multivitamin-iron-minerals-folic acid (CENTRUM) chewable tablet Chew 1 tablet by mouth daily.   olmesartan (BENICAR) 40 MG tablet Take 40 mg by mouth at bedtime.    Omega-3 Fatty Acids (FISH OIL PO) Take 2,000 mg by mouth daily.    omeprazole (PRILOSEC) 40 MG capsule Take 40 mg by mouth daily as needed (for acid reflux).   potassium chloride (KLOR-CON)  10 MEQ tablet TAKE 1 TABLET EACH DAY.   sotalol (BETAPACE) 80 MG tablet TAKE 1 TABLET BY MOUTH TWICE DAILY.   SYNTHROID 100 MCG tablet Take 100 mcg by mouth daily before breakfast.    traZODone (DESYREL) 50 MG tablet Take 50 mg by mouth at bedtime.   zolpidem (AMBIEN) 10 MG tablet Take 10 mg by mouth at bedtime.   [DISCONTINUED] doxazosin (CARDURA) 4 MG tablet Take 1 tablet (4 mg total) by mouth daily.   [DISCONTINUED] doxazosin (CARDURA) 8 MG tablet Take 8 mg by mouth at bedtime.      Allergies:   Patient has no known allergies.   Social History   Socioeconomic History   Marital status: Married    Spouse name: Not on file   Number of children: Not on file   Years of education: Not on file   Highest education level: Not on file  Occupational History   Not on file  Tobacco Use   Smoking status: Former    Packs/day: 0.50    Years: 5.00    Pack years: 2.50    Types: Cigarettes    Quit date: 05/06/1999    Years since quitting: 21.6   Smokeless tobacco: Former    Quit date: 07/28/1995   Tobacco comments:    smoked on and off  Vaping Use   Vaping Use: Never used  Substance and Sexual Activity   Alcohol use: Yes    Alcohol/week: 1.0 standard drink    Types: 1 Cans of beer per week    Comment: occasionally   Drug use: No   Sexual activity: Not on file  Other Topics Concern   Not on file  Social History Narrative   Not on file   Social Determinants of Health   Financial Resource Strain: Not on file  Food Insecurity: Not on file  Transportation Needs: Not on file  Physical Activity: Not on file  Stress: Not on file  Social Connections: Not on file     Family History: The patient's family history includes Asthma in his mother; Heart attack in his father; Heart disease in his father; Hyperlipidemia in his brother; Parkinsonism in his mother; Prostate cancer in his father.  ROS:   Please see the history of present illness.  All other systems reviewed and are  negative.  Labs/Other Studies Reviewed:    The following studies were reviewed today:  Echo 8/20  Left Ventricle: The left ventricle has normal systolic function, with an  ejection fraction of 55-60%. The cavity size was normal. There is mildly  increased left ventricular wall thickness. Left ventricular diastolic  parameters were normal.  Right Ventricle: The right ventricle has normal systolic function. The  cavity was normal.  Left Atrium: Left atrial size was mildly dilated.  Right Atrium: Right atrial size was normal in size. Right atrial pressure  is estimated at 10 mmHg.  Interatrial Septum: No atrial level shunt detected by color flow Doppler.  Pericardium: There is no evidence of pericardial effusion.  Mitral Valve: The mitral valve is abnormal. Mild thickening of the mitral  valve leaflet. Mitral valve regurgitation is not visualized by color flow  Doppler.  Tricuspid Valve: The tricuspid valve is grossly normal. Tricuspid valve  regurgitation is trivial by color flow Doppler.  Aortic Valve: The aortic valve is tricuspid Mild thickening of the aortic  valve. Aortic valve regurgitation was not visualized by color flow  Doppler. There is No stenosis of the aortic valve.  Pulmonic Valve: The pulmonic valve was not well visualized. Pulmonic valve  regurgitation is not visualized by color flow Doppler.  Aorta: The aorta is abnormal in size and structure. There is mild  dilatation of the aortic root and of the ascending aorta measuring 38 mm.  Venous: The inferior vena cava measures 1.10 cm, is normal in size with  greater than 50% respiratory variability.   LHC 11/15  Left mainstem: the left mainstem has moderate calcification. The vessel has 20-30% stenosis in its midportion. The ostium of the left main is widely patent. The distal left main is widely patent. The stenosis in the left mainstem appears very mild in severity. The vessel divides into the LAD and left  circumflex. Left anterior descending (LAD): the LAD is medium in caliber. The vessel reaches the LV apex. The proximal vessel has diffuse irregularity with mild calcification. The first diagonal has 30-40% stenosis at its ostium. The mid LAD at that bifurcation also has 30-40% stenosis. The remaining portions of the LAD have mild nonobstructive disease. Left circumflex (LCx): the left circumflex is patent. The mid vessel has 40% stenosis. There is a tiny obtuse marginal branch that has 50% stenosis. The major OM branch is widely patent with minor nonobstructive disease. Right coronary artery (RCA): this is a dominant vessel. There is diffuse calcification and irregularity but there are no high-grade stenoses present. There is no more than about 20 or 30% stenosis which is most significant at the junction of the mid and distal RCA. The PDA and PLA branches are patent. Left ventriculography: Left ventricular systolic function is normal, LVEF is estimated at 55%, there is no significant mitral regurgitation  Estimated Blood Loss: minimal   Final Conclusions:   1. Diffusely calcified coronary arteries with only mild nonobstructive CAD 2. Normal left ventricular systolic function with normal LVEDP   Recommendations: medical management of nonobstructive CAD.     Recent Labs: 02/22/2020: BUN 17; Creatinine, Ser 1.25; Magnesium 2.0; Potassium 3.8; Sodium 141 08/25/2020: Hemoglobin 14.7; Platelets 220  Recent Lipid Panel    Component Value Date/Time   CHOL 113 07/19/2017 0755   TRIG 68 07/19/2017 0755   HDL 38 (L) 07/19/2017 0755   CHOLHDL 3.0 07/19/2017 0755   CHOLHDL 2.6 07/28/2015 1042   VLDL 17 07/28/2015 1042   LDLCALC 61 07/19/2017 0755     Risk Assessment/Calculations:    CHA2DS2-VASc Score = 3  {} This indicates a 3.2% annual risk of stroke. The patient's score is based upon: CHF History: 0 HTN History: 1 Diabetes History: 0 Stroke History: 0 Vascular Disease History: 1 Age  Score: 1 Gender Score: 0        Physical Exam:    VS:  BP (!) 110/52    Pulse (!) 48    Ht 5\' 9"  (1.753 m)    Wt 209 lb 12.8 oz (95.2 kg)  SpO2 97%    BMI 30.98 kg/m     Wt Readings from Last 3 Encounters:  01/10/21 209 lb 12.8 oz (95.2 kg)  09/01/20 223 lb (101.2 kg)  02/22/20 222 lb 9.6 oz (101 kg)     GEN: *** Well nourished, well developed in no acute distress HEENT: Normal NECK: No JVD; No carotid bruits LYMPHATICS: No lymphadenopathy CARDIAC: ***RRR, no murmurs, rubs, gallops RESPIRATORY:  Clear to auscultation without rales, wheezing or rhonchi  ABDOMEN: Soft, non-tender, non-distended MUSCULOSKELETAL:  No edema; No deformity  SKIN: Warm and dry NEUROLOGIC:  Alert and oriented x 3 PSYCHIATRIC:  Normal affect   EKG:  EKG is *** ordered today.  The ekg ordered today demonstrates ***  Diagnoses:    1. PVC's (premature ventricular contractions)   2. Chronic anticoagulation   3. Essential hypertension   4. PAF (paroxysmal atrial fibrillation) ([place])   5. Long term current use of antiarrhythmic drug   6. Murmur   7. Hyperlipidemia LDL goal <70    Assessment and Plan:     ***      {Are you ordering a CV Procedure (e.g. stress test, cath, DCCV, TEE, etc)?   Press F2        :[id number]}    Medication Adjustments/Labs and Tests Ordered: Current medicines are reviewed at length with the patient today.  Concerns regarding medicines are outlined above.  Orders Placed This Encounter  Procedures   EKG 12-Lead   Meds ordered this encounter  Medications   DISCONTD: doxazosin (CARDURA) 4 MG tablet    Sig: Take 1 tablet (4 mg total) by mouth daily.    Dispense:  30 tablet    Refill:  11    Order Specific Question:   Supervising Provider    Answer:   Thayer Headings [[id number]]   doxazosin (CARDURA) 4 MG tablet    Sig: Take 1 tablet (4 mg total) by mouth daily.    Dispense:  30 tablet    Refill:  11    Patient Instructions  Medication Instructions:   Your  physician recommends that you continue on your current medications as directed. Please refer to the Current Medication list given to you today.  Pt was given a written script today for Doxazosin one ( 1 ) tablet by mouth ( 4 mg) daily patient's will reduce if BP is consistently below 120/80.    *If you need a refill on your cardiac medications before your next appointment, please call your pharmacy*   Lab Work:  -NONE  If you have labs (blood work) drawn today and your tests are completely normal, you will receive your results only by: [place] (if you have MyChart) OR A paper copy in the mail If you have any lab test that is abnormal or we need to change your treatment, we will call you to review the results.   Testing/Procedures:  -NONE   Follow-Up: At [facility], you and your health needs are our priority.  As part of our continuing mission to provide you with exceptional heart care, we have created designated Provider Care Teams.  These Care Teams include your primary Cardiologist (physician) and Advanced Practice Providers (APPs -  Physician Assistants and Nurse Practitioners) who all work together to provide you with the care you need, when you need it.  We recommend signing up for the patient portal called "MyChart".  Sign up information is provided on this After Visit Summary.  MyChart is used to connect with  patients for Virtual Visits (Telemedicine).  Patients are able to view lab/test results, encounter notes, upcoming appointments, etc.  Non-urgent messages can be sent to your provider as well.   To learn more about what you can do with MyChart, go to [email or web address].    Your next appointment:   1 year(s)  The format for your next appointment:   In Person  Provider:   Mertie Moores, MD     Other Instructions  Your physician wants you to follow-up in: 1 year with Dr. Cathie Olden.  You will receive a reminder letter in the mail two months in advance.  If you don't receive a letter, please call our office to schedule the follow-up appointment.  Please monitor blood pressure and send in readings via mychart in 7-14 days.  If blood pressure is consistently below 120/ 80 reduce doxazosin one ( 1) tablet by mouth ( 4 mg ) daily.         Signed, Emmaline Life, NP  01/10/2021 12:03 PM    [facility]

## 2021-01-10 ENCOUNTER — Other Ambulatory Visit: Payer: Self-pay

## 2021-01-10 ENCOUNTER — Ambulatory Visit: Payer: Medicare Other | Admitting: Nurse Practitioner

## 2021-01-10 ENCOUNTER — Encounter: Payer: Self-pay | Admitting: Nurse Practitioner

## 2021-01-10 VITALS — BP 110/52 | HR 48 | Ht 69.0 in | Wt 209.8 lb

## 2021-01-10 DIAGNOSIS — R011 Cardiac murmur, unspecified: Secondary | ICD-10-CM | POA: Diagnosis not present

## 2021-01-10 DIAGNOSIS — I493 Ventricular premature depolarization: Secondary | ICD-10-CM | POA: Diagnosis not present

## 2021-01-10 DIAGNOSIS — I10 Essential (primary) hypertension: Secondary | ICD-10-CM | POA: Diagnosis not present

## 2021-01-10 DIAGNOSIS — E785 Hyperlipidemia, unspecified: Secondary | ICD-10-CM

## 2021-01-10 DIAGNOSIS — Z79899 Other long term (current) drug therapy: Secondary | ICD-10-CM | POA: Diagnosis not present

## 2021-01-10 DIAGNOSIS — I480 Paroxysmal atrial fibrillation: Secondary | ICD-10-CM | POA: Diagnosis not present

## 2021-01-10 DIAGNOSIS — Z7901 Long term (current) use of anticoagulants: Secondary | ICD-10-CM | POA: Diagnosis not present

## 2021-01-10 MED ORDER — DOXAZOSIN MESYLATE 4 MG PO TABS: 4.0000 mg | ORAL_TABLET | Freq: Every day | ORAL | 11 refills | Status: DC

## 2021-01-10 NOTE — Progress Notes (Signed)
Cardiology Office Note:    Date:  01/10/2021   ID:  CHARVEZ VOORHIES, DOB [birth year], MRN [id number]  PCP:  Kelton Pillar, MD   [facility] Providers Cardiologist:  Mertie Moores, MD Electrophysiologist:  Will [name], MD     Referring MD: Kelton Pillar, MD   Chief Complaint: One year follow-up of CAD, PAF, PVCs  History of Present Illness:    Logan Sanders is a 76 y.o. male with a hx of CAD, PAF, HTN, hyperlipidemia, and symptomatic PVCs.  He was initially referred to cardiology for management of hypertension and hyperlipidemia in 2015. He had an elevated coronary calcium score and due to significant family hx of CAD, he was scheduled for stress test. Myoview 11/15 was abnormal and revealed intermediate risk study with mild lateral wall ischemia and mildly depressed LVEF 47%. He underwent LHC which revealed diffusely calcified coronary arteries with only mild nonobstructive CAD, normal left ventricular systolic function, nl LVEDP.   He had a lexiscan myoview 7/20 which revealed severely depressed LVEF < 30%, felt to be due to frequent PVCs. Echo was done for follow-up which revealed nl LVEF 55-60%, mildly increased LVH, mildly dilated LA, mild valvular dysfunction.   Previously attempted PVC ablation 10/20 but his PVCs were coming from quite close to RCA and unfortunately he was not ablated. He was started on sotalol. He was last seen by Dr. Curt Bears 9/22 and by Dr. Acie Fredrickson 11/21  Today, he is here alone for annual follow-up.  He reports 15 pound intentional weight loss since September.  He denies chest pain, shortness of breath, lower extremity edema, fatigue, palpitations, melena, hematuria, hemoptysis, diaphoresis, weakness, presyncope, syncope, orthopnea, and PND.  Noted that blood pressure was significantly lower than at last office visit today. He is not monitoring BP consistently at home.  Also noted lower pulse reading today.  He reports one episode of HR 30's > 1 year  ago.  He called our on-call provider at that time and with no other symptoms he was advised to monitor. He has had no further episodes.  He denies specific complaint today.  Past Medical History:  Diagnosis Date   BPH (benign prostatic hyperplasia)    CAD in native artery    Complication of anesthesia    gets really anxious when waking up   ED (erectile dysfunction)    Hyperlipidemia    Hypertension    Hypothyroidism    Osteoarthritis    PVC's (premature ventricular contractions)    3-day cardiac monitor 07/2018: NSR, frequent PVCs (PVC burden 15.9%); rare PACs and rare episode of non-sustained SVT  //  Echo 08/2018: EF 55-60, mild LVH, normal RV SF, mild LAE, mild dilation of aortic root and ascending aorta (38 mm)    Thyroid disease     Past Surgical History:  Procedure Laterality Date   CARDIAC CATHETERIZATION  11/09/13   non obstructive disease in all vessels.   COLONOSCOPY     KNEE ARTHROSCOPY Left    x3   LEFT HEART CATHETERIZATION WITH CORONARY ANGIOGRAM N/A 11/09/2013   Procedure: LEFT HEART CATHETERIZATION WITH CORONARY ANGIOGRAM;  Surgeon: Blane Ohara, MD;  Location: [facility] CATH LAB;  Service: Cardiovascular;  Laterality: N/A;   LOWER BACK SURGERY  1985   LUMBAR LAMINECTOMY/DECOMPRESSION MICRODISCECTOMY N/A 12/18/2017   Procedure: LAMINECTOMY AND FORAMINOTOMY LUMBAR TWO- LUMBAR THREE, LUMBAR THREE- LUMBAR FOUR, LUMBAR FOUR- LUMBAR FIVE;  Surgeon: Newman Pies, MD;  Location: [place];  Service: Neurosurgery;  Laterality: N/A;   NECK  SURGERY  1999   PVC ABLATION N/A 10/07/2018   Procedure: PVC ABLATION;  Surgeon: Constance Haw, MD;  Location: [facility] CV LAB;  Service: Cardiovascular;  Laterality: N/A;   REPLACEMENT TOTAL KNEE  2005   LEFT KNEE   WISDOM TOOTH EXTRACTION      Current Medications: Current Meds  Medication Sig   amLODipine (NORVASC) 5 MG tablet TAKE 1 TABLET ONCE DAILY.   atorvastatin (LIPITOR) 40 MG tablet Take 1 tablet (40 mg total) by mouth  daily.   Cholecalciferol (D3-1000) 25 MCG (1000 UT) tablet Take 1,000 Units by mouth daily.   Co-Enzyme Q-10 100 MG CAPS Take 100 mg by mouth daily.    Cyanocobalamin (VITAMIN B-12 PO) Take 1 tablet by mouth daily.   diazepam (VALIUM) 5 MG tablet Take 5 mg by mouth as needed.   ELIQUIS 5 MG TABS tablet TAKE 1 TABLET BY MOUTH TWICE DAILY.   fexofenadine (ALLEGRA) 180 MG tablet Take 180 mg by mouth daily as needed for allergies.    hydrochlorothiazide (HYDRODIURIL) 12.5 MG tablet Take 12.5 mg by mouth at bedtime.    multivitamin-iron-minerals-folic acid (CENTRUM) chewable tablet Chew 1 tablet by mouth daily.   olmesartan (BENICAR) 40 MG tablet Take 40 mg by mouth at bedtime.    Omega-3 Fatty Acids (FISH OIL PO) Take 2,000 mg by mouth daily.    omeprazole (PRILOSEC) 40 MG capsule Take 40 mg by mouth daily as needed (for acid reflux).   potassium chloride (KLOR-CON) 10 MEQ tablet TAKE 1 TABLET EACH DAY.   sotalol (BETAPACE) 80 MG tablet TAKE 1 TABLET BY MOUTH TWICE DAILY.   SYNTHROID 100 MCG tablet Take 100 mcg by mouth daily before breakfast.    traZODone (DESYREL) 50 MG tablet Take 50 mg by mouth at bedtime.   zolpidem (AMBIEN) 10 MG tablet Take 10 mg by mouth at bedtime.   [DISCONTINUED] doxazosin (CARDURA) 4 MG tablet Take 1 tablet (4 mg total) by mouth daily.   [DISCONTINUED] doxazosin (CARDURA) 8 MG tablet Take 8 mg by mouth at bedtime.      Allergies:   Patient has no known allergies.   Social History   Socioeconomic History   Marital status: Married    Spouse name: Not on file   Number of children: Not on file   Years of education: Not on file   Highest education level: Not on file  Occupational History   Not on file  Tobacco Use   Smoking status: Former    Packs/day: 0.50    Years: 5.00    Pack years: 2.50    Types: Cigarettes    Quit date: 05/06/1999    Years since quitting: 21.6   Smokeless tobacco: Former    Quit date: 07/28/1995   Tobacco comments:    smoked on and  off  Vaping Use   Vaping Use: Never used  Substance and Sexual Activity   Alcohol use: Yes    Alcohol/week: 1.0 standard drink    Types: 1 Cans of beer per week    Comment: occasionally   Drug use: No   Sexual activity: Not on file  Other Topics Concern   Not on file  Social History Narrative   Not on file   Social Determinants of Health   Financial Resource Strain: Not on file  Food Insecurity: Not on file  Transportation Needs: Not on file  Physical Activity: Not on file  Stress: Not on file  Social Connections: Not on file  Family History: The patient's family history includes Asthma in his mother; Heart attack in his father; Heart disease in his father; Hyperlipidemia in his brother; Parkinsonism in his mother; Prostate cancer in his father.  ROS:   Please see the history of present illness. All other systems reviewed and are negative.  Labs/Other Studies Reviewed:    The following studies were reviewed today:  Echo 8/20  Left Ventricle: The left ventricle has normal systolic function, with an  ejection fraction of 55-60%. The cavity size was normal. There is mildly  increased left ventricular wall thickness. Left ventricular diastolic  parameters were normal.  Right Ventricle: The right ventricle has normal systolic function. The  cavity was normal.  Left Atrium: Left atrial size was mildly dilated.  Right Atrium: Right atrial size was normal in size. Right atrial pressure  is estimated at 10 mmHg.  Interatrial Septum: No atrial level shunt detected by color flow Doppler.  Pericardium: There is no evidence of pericardial effusion.  Mitral Valve: The mitral valve is abnormal. Mild thickening of the mitral  valve leaflet. Mitral valve regurgitation is not visualized by color flow  Doppler.  Tricuspid Valve: The tricuspid valve is grossly normal. Tricuspid valve  regurgitation is trivial by color flow Doppler.  Aortic Valve: The aortic valve is tricuspid Mild  thickening of the aortic  valve. Aortic valve regurgitation was not visualized by color flow  Doppler. There is No stenosis of the aortic valve.  Pulmonic Valve: The pulmonic valve was not well visualized. Pulmonic valve  regurgitation is not visualized by color flow Doppler.  Aorta: The aorta is abnormal in size and structure. There is mild  dilatation of the aortic root and of the ascending aorta measuring 38 mm.  Venous: The inferior vena cava measures 1.10 cm, is normal in size with  greater than 50% respiratory variability.   LHC 11/15  Left mainstem: the left mainstem has moderate calcification. The vessel has 20-30% stenosis in its midportion. The ostium of the left main is widely patent. The distal left main is widely patent. The stenosis in the left mainstem appears very mild in severity. The vessel divides into the LAD and left circumflex. Left anterior descending (LAD): the LAD is medium in caliber. The vessel reaches the LV apex. The proximal vessel has diffuse irregularity with mild calcification. The first diagonal has 30-40% stenosis at its ostium. The mid LAD at that bifurcation also has 30-40% stenosis. The remaining portions of the LAD have mild nonobstructive disease. Left circumflex (LCx): the left circumflex is patent. The mid vessel has 40% stenosis. There is a tiny obtuse marginal branch that has 50% stenosis. The major OM branch is widely patent with minor nonobstructive disease. Right coronary artery (RCA): this is a dominant vessel. There is diffuse calcification and irregularity but there are no high-grade stenoses present. There is no more than about 20 or 30% stenosis which is most significant at the junction of the mid and distal RCA. The PDA and PLA branches are patent. Left ventriculography: Left ventricular systolic function is normal, LVEF is estimated at 55%, there is no significant mitral regurgitation  Estimated Blood Loss: minimal   Final Conclusions:   1.  Diffusely calcified coronary arteries with only mild nonobstructive CAD 2. Normal left ventricular systolic function with normal LVEDP   Recommendations: medical management of nonobstructive CAD.    Recent Labs: From [facility] 10/26[facility]20: Creat 1.25, K 4.2, A1C 5.5%, ALT 32, TSH 1.53 08/25/20 hgb 14.7, plt 220  Recent Lipid Panel [facility] 10/26/20 LDL 59, HDL 38, Trigs 78   Risk Assessment/Calculations:    CHA2DS2-VASc Score = 3  {This indicates a 3.2% annual risk of stroke. The patient's score is based upon: CHF History: 0 HTN History: 1 Diabetes History: 0 Stroke History: 0 Vascular Disease History: 1 Age Score: 1 Gender Score: 0      Physical Exam:    VS:  BP (!) 110/52    Pulse (!) 48    Ht 5\' 9"  (1.753 m)    Wt 209 lb 12.8 oz (95.2 kg)    SpO2 97%    BMI 30.98 kg/m     Wt Readings from Last 3 Encounters:  01/10/21 209 lb 12.8 oz (95.2 kg)  09/01/20 223 lb (101.2 kg)  02/22/20 222 lb 9.6 oz (101 kg)     GEN:  Well nourished, well developed in no acute distress HEENT: Normal NECK: No JVD; No carotid bruits LYMPHATICS: No lymphadenopathy CARDIAC: RRR, 2/6 systolic murmur at area of RUSB. No rubs, gallops RESPIRATORY:  Clear to auscultation without rales, wheezing or rhonchi  ABDOMEN: Soft, non-tender, non-distended MUSCULOSKELETAL:  No edema; No deformity  SKIN: Warm and dry NEUROLOGIC:  Alert and oriented x 3 PSYCHIATRIC:  Normal affect   EKG:  EKG is ordered today.  The ekg ordered today demonstrates sinus bradycardia at rate of 48 bpm, LAD, no ST/T wave abnormality.  Diagnoses:    1. PVC's (premature ventricular contractions)   2. Chronic anticoagulation   3. Essential hypertension   4. PAF (paroxysmal atrial fibrillation) ([place])   5. Long term current use of antiarrhythmic drug   6. Murmur   7. Hyperlipidemia LDL goal <70    Assessment and Plan:     PVCs, on antiarrythmic therapy: EKG today reveals sinus bradycardia at 48 bpm, no PVCs. He is on Sotalol for  management of PVCs. Had attempted PVC ablation on 10/20 but unfortunately it could not be completed due to proximity to RCA. He denies worsening palpitations, dizziness, presyncope, syncope, chest pain, dyspnea. He notes occasional lightheadedness with position change and one episode > 1 year ago with HR in the 30's.  He continues to complete regular activities without difficulty. Will forward to Dr. Curt Bears for review regarding medication management in the setting of bradycardia.  PAF on chronic anticoagulation: Maintaining sinus rhythm today. On Sotalol as noted above. HR is 48 bpm but he denies significant fatigue, other other symptom concerning for symptomatic bradycardia.  As noted above we will forward note to Dr. Curt Bears for review regarding Sotalol dose. No bleeding concerns. Continue Eliquis.  Essential hypertension: Blood pressure is somewhat soft today.  He has not been monitoring on a consistent basis at home. We discussed reducing medication in the setting of recent weight loss. He would like to monitor home blood pressure readings for 7 to 14 days and report back to [place] before reducing any medications. Per his preference, he was given a paper prescription for lower dose Cardura 4 mg. He will await reporting back prior to filling this Rx. Continue amlodipine, hctz, doxazosin, olmesartan.   Mild Nonobstructive CAD: He denies chest pain, dypsnea, or other symptom concerning for angina. He is not on aspirin due to chronic anticoagulation on Eliquis. Continue atorvastatin.   Hyperlipidemia LDL goal < 70: LDL 59 on 10/26/20, at goal. Continue atorvastatin.   Murmur: He has a slight 2/6 systolic murmur on exam today best auscultated at RUSB.  Last echocardiogram 8/20 revealed mild thickening of the  mitral valve leaflet without regurgitation, trivial TR, mild thickening of the aortic valve without stenosis, no AI. He does not have symptoms of worsening valve disease. Will continue to monitor with  repeat echo in 3-5 years from previous unless symptoms warrant sooner.    Disposition: 1 year with Dr. Acie Fredrickson; plan for May 2023 with Dr. Curt Bears unless needed sooner   Medication Adjustments/Labs and Tests Ordered: Current medicines are reviewed at length with the patient today.  Concerns regarding medicines are outlined above.  Orders Placed This Encounter  Procedures   EKG 12-Lead   Meds ordered this encounter  Medications   DISCONTD: doxazosin (CARDURA) 4 MG tablet    Sig: Take 1 tablet (4 mg total) by mouth daily.    Dispense:  30 tablet    Refill:  11    Order Specific Question:   Supervising Provider    Answer:   Thayer Headings [[id number]]   doxazosin (CARDURA) 4 MG tablet    Sig: Take 1 tablet (4 mg total) by mouth daily.    Dispense:  30 tablet    Refill:  11    Patient Instructions  Medication Instructions:   Your physician recommends that you continue on your current medications as directed. Please refer to the Current Medication list given to you today.  Pt was given a written script today for Doxazosin one ( 1 ) tablet by mouth ( 4 mg) daily patient's will reduce if BP is consistently below 120/80.    *If you need a refill on your cardiac medications before your next appointment, please call your pharmacy*   Lab Work:  -NONE  If you have labs (blood work) drawn today and your tests are completely normal, you will receive your results only by: [place] (if you have MyChart) OR A paper copy in the mail If you have any lab test that is abnormal or we need to change your treatment, we will call you to review the results.   Testing/Procedures:  -NONE   Follow-Up: At [facility], you and your health needs are our priority.  As part of our continuing mission to provide you with exceptional heart care, we have created designated Provider Care Teams.  These Care Teams include your primary Cardiologist (physician) and Advanced Practice Providers (APPs -   Physician Assistants and Nurse Practitioners) who all work together to provide you with the care you need, when you need it.  We recommend signing up for the patient portal called "MyChart".  Sign up information is provided on this After Visit Summary.  MyChart is used to connect with patients for Virtual Visits (Telemedicine).  Patients are able to view lab/test results, encounter notes, upcoming appointments, etc.  Non-urgent messages can be sent to your provider as well.   To learn more about what you can do with MyChart, go to [email or web address].    Your next appointment:   1 year(s)  The format for your next appointment:   In Person  Provider:   Mertie Moores, MD     Other Instructions  Your physician wants you to follow-up in: 1 year with Dr. Cathie Olden.  You will receive a reminder letter in the mail two months in advance. If you don't receive a letter, please call our office to schedule the follow-up appointment.  Please monitor blood pressure and send in readings via mychart in 7-14 days.  If blood pressure is consistently below 120/ 80 reduce doxazosin one ( 1) tablet by mouth ( 4  mg ) daily.         Signed, Emmaline Life, NP  01/10/2021 11:52 AM    [place]

## 2021-01-10 NOTE — Patient Instructions (Addendum)
Medication Instructions:   Your physician recommends that you continue on your current medications as directed. Please refer to the Current Medication list given to you today.  Pt was given a written script today for Doxazosin one ( 1 ) tablet by mouth ( 4 mg) daily patient's will reduce if BP is consistently below 120/80.    *If you need a refill on your cardiac medications before your next appointment, please call your pharmacy*   Lab Work:  -NONE  If you have labs (blood work) drawn today and your tests are completely normal, you will receive your results only by: [place] (if you have MyChart) OR A paper copy in the mail If you have any lab test that is abnormal or we need to change your treatment, we will call you to review the results.   Testing/Procedures:  -NONE   Follow-Up: At [facility], you and your health needs are our priority.  As part of our continuing mission to provide you with exceptional heart care, we have created designated Provider Care Teams.  These Care Teams include your primary Cardiologist (physician) and Advanced Practice Providers (APPs -  Physician Assistants and Nurse Practitioners) who all work together to provide you with the care you need, when you need it.  We recommend signing up for the patient portal called "MyChart".  Sign up information is provided on this After Visit Summary.  MyChart is used to connect with patients for Virtual Visits (Telemedicine).  Patients are able to view lab/test results, encounter notes, upcoming appointments, etc.  Non-urgent messages can be sent to your provider as well.   To learn more about what you can do with MyChart, go to [email or web address].    Your next appointment:   1 year(s)  The format for your next appointment:   In Person  Provider:   Mertie Moores, MD     Other Instructions  Your physician wants you to follow-up in: 1 year with Dr. Cathie Olden.  You will receive a reminder letter in  the mail two months in advance. If you don't receive a letter, please call our office to schedule the follow-up appointment.  Please monitor blood pressure and send in readings via mychart in 7-14 days.  If blood pressure is consistently below 120/ 80 reduce doxazosin one ( 1) tablet by mouth ( 4 mg ) daily.

## 2021-01-10 NOTE — Progress Notes (Signed)
Cardiology Office Note Date:  01/10/2021  Patient ID:  Logan Sanders 1945-08-09, MRN [id number] PCP:  Kelton Pillar, MD  Cardiologist:  Dr. Johnsie Cancel Electrophysiologist: Dr. Curt Bears     Chief Complaint: f/u on HR  History of Present Illness: Logan Sanders is a 76 y.o. male with history of AFib, CAD (non-obstructive by cath 2015), HTN, HLD, hypothyroidism, PVCs, NICM (felt 2/2 PVCs (?)).  Note that he had improvement in his EF prior to sotalol and partial ablation procedure.  He comes in today to be seen for Dr. Curt Bears, last seen by him in Sept 2022, was doing well on sotalol with minimal if any symptoms of AFib or his PVCs.  He saw Elwyn Reach, NP 01/10/21, feeling well, succeeding at weight loss efforts.  Noted to be bradycardiac  (without symptoms) and recommended to f/u with EP  TODAY He feels well Drink 2-3 bottles of water a day only, occassionally has a few seconds of lightheadedness upon standing only. No dizziness, near syncope or syncope otherwise  He has never felt lightheaded seated or supine. He feels like his PVCs are well controlled Has an infrequent funny feeling, maybe a gurgling in his chest that resolves with belching and known GERD He does not formally exercise though is busy around the house, yard, does take walks. Continues to work, drives a tour bus, gets on/off periodically Denies any exertional intolerances or changes to his exertional capacity.  No bleeding or signs of bleeding  AFib/PVC hx AFib diagnosed 2017 EPS for PVCs, incomplete ablation 2/2 to proximity to the RCA Sotalol started Nov 2020   Past Medical History:  Diagnosis Date   BPH (benign prostatic hyperplasia)    CAD in native artery    Complication of anesthesia    gets really anxious when waking up   ED (erectile dysfunction)    Hyperlipidemia    Hypertension    Hypothyroidism    Osteoarthritis    PVC's (premature ventricular contractions)    3-day cardiac monitor  07/2018: NSR, frequent PVCs (PVC burden 15.9%); rare PACs and rare episode of non-sustained SVT  //  Echo 08/2018: EF 55-60, mild LVH, normal RV SF, mild LAE, mild dilation of aortic root and ascending aorta (38 mm)    Thyroid disease     Past Surgical History:  Procedure Laterality Date   CARDIAC CATHETERIZATION  11/09/13   non obstructive disease in all vessels.   COLONOSCOPY     KNEE ARTHROSCOPY Left    x3   LEFT HEART CATHETERIZATION WITH CORONARY ANGIOGRAM N/A 11/09/2013   Procedure: LEFT HEART CATHETERIZATION WITH CORONARY ANGIOGRAM;  Surgeon: Blane Ohara, MD;  Location: [facility] CATH LAB;  Service: Cardiovascular;  Laterality: N/A;   LOWER BACK SURGERY  1985   LUMBAR LAMINECTOMY/DECOMPRESSION MICRODISCECTOMY N/A 12/18/2017   Procedure: LAMINECTOMY AND FORAMINOTOMY LUMBAR TWO- LUMBAR THREE, LUMBAR THREE- LUMBAR FOUR, LUMBAR FOUR- LUMBAR FIVE;  Surgeon: Newman Pies, MD;  Location: [facility];  Service: Neurosurgery;  Laterality: N/A;   NECK SURGERY  1999   PVC ABLATION N/A 10/07/2018   Procedure: PVC ABLATION;  Surgeon: Constance Haw, MD;  Location: [facility] LAB;  Service: Cardiovascular;  Laterality: N/A;   REPLACEMENT TOTAL KNEE  2005   LEFT KNEE   WISDOM TOOTH EXTRACTION      Current Outpatient Medications  Medication Sig Dispense Refill   amLODipine (NORVASC) 5 MG tablet TAKE 1 TABLET ONCE DAILY. 30 tablet 10   atorvastatin (LIPITOR) 40 MG tablet Take  1 tablet (40 mg total) by mouth daily. 90 tablet 3   Cholecalciferol (D3-1000) 25 MCG (1000 UT) tablet Take 1,000 Units by mouth daily.     Co-Enzyme Q-10 100 MG CAPS Take 100 mg by mouth daily.      Cyanocobalamin (VITAMIN B-12 PO) Take 1 tablet by mouth daily.     diazepam (VALIUM) 5 MG tablet Take 5 mg by mouth as needed.     doxazosin (CARDURA) 4 MG tablet Take 1 tablet (4 mg total) by mouth daily. 30 tablet 11   ELIQUIS 5 MG TABS tablet TAKE 1 TABLET BY MOUTH TWICE DAILY. 60 tablet 5   fexofenadine (ALLEGRA) 180  MG tablet Take 180 mg by mouth daily as needed for allergies.      hydrochlorothiazide (HYDRODIURIL) 12.5 MG tablet Take 12.5 mg by mouth at bedtime.      multivitamin-iron-minerals-folic acid (CENTRUM) chewable tablet Chew 1 tablet by mouth daily.     olmesartan (BENICAR) 40 MG tablet Take 40 mg by mouth at bedtime.      Omega-3 Fatty Acids (FISH OIL PO) Take 2,000 mg by mouth daily.      omeprazole (PRILOSEC) 40 MG capsule Take 40 mg by mouth daily as needed (for acid reflux).     potassium chloride (KLOR-CON) 10 MEQ tablet TAKE 1 TABLET EACH DAY. 90 tablet 3   sotalol (BETAPACE) 80 MG tablet TAKE 1 TABLET BY MOUTH TWICE DAILY. 60 tablet 11   SYNTHROID 100 MCG tablet Take 100 mcg by mouth daily before breakfast.      traZODone (DESYREL) 50 MG tablet Take 50 mg by mouth at bedtime.     zolpidem (AMBIEN) 10 MG tablet Take 10 mg by mouth at bedtime.     No current facility-administered medications for this visit.    Allergies:   Patient has no known allergies.   Social History:  The patient  reports that he quit smoking about 21 years ago. His smoking use included cigarettes. He has a 2.50 pack-year smoking history. He quit smokeless tobacco use about 25 years ago. He reports current alcohol use of about 1.0 standard drink per week. He reports that he does not use drugs.   Family History:  The patient's family history includes Asthma in his mother; Heart attack in his father; Heart disease in his father; Hyperlipidemia in his brother; Parkinsonism in his mother; Prostate cancer in his father.  ROS:  Please see the history of present illness.    All other systems are reviewed and otherwise negative.   PHYSICAL EXAM:  VS:  There were no vitals taken for this visit. BMI: There is no height or weight on file to calculate BMI. Well nourished, well developed, in no acute distress HEENT: normocephalic, atraumatic Neck: no JVD, carotid bruits or masses Cardiac:  RRR; bradycardic, no significant  murmurs, no rubs, or gallops Lungs:  CTA b/l, no wheezing, rhonchi or rales Abd: soft, nontender MS: no deformity or atrophy Ext: no edema Skin: warm and dry, no rash Neuro:  No gross deficits appreciated Psych: euthymic mood, full affect   EKG:  Done today and reviewed by myself shows  SB 50bpm, Qtc 418ms   10/07/2018: EPS/ablation CONCLUSIONS:  1. Sinus rhythm upon presentation.  2.  Ablation of PVCs in the right ventricular outflow tract and right coronary cusp 3.  Reduced PVC burden 4. No inducible arrhythmias following ablation.  5. No early apparent complications.   Echo 8/20 Left Ventricle: The left ventricle has normal  systolic function, with an  ejection fraction of 55-60%. The cavity size was normal. There is mildly  increased left ventricular wall thickness. Left ventricular diastolic  parameters were normal.  Right Ventricle: The right ventricle has normal systolic function. The  cavity was normal.  Left Atrium: Left atrial size was mildly dilated.  Right Atrium: Right atrial size was normal in size. Right atrial pressure  is estimated at 10 mmHg.  Interatrial Septum: No atrial level shunt detected by color flow Doppler.  Pericardium: There is no evidence of pericardial effusion.  Mitral Valve: The mitral valve is abnormal. Mild thickening of the mitral  valve leaflet. Mitral valve regurgitation is not visualized by color flow  Doppler.  Tricuspid Valve: The tricuspid valve is grossly normal. Tricuspid valve  regurgitation is trivial by color flow Doppler.  Aortic Valve: The aortic valve is tricuspid Mild thickening of the aortic  valve. Aortic valve regurgitation was not visualized by color flow  Doppler. There is No stenosis of the aortic valve.  Pulmonic Valve: The pulmonic valve was not well visualized. Pulmonic valve  regurgitation is not visualized by color flow Doppler.  Aorta: The aorta is abnormal in size and structure. There is mild  dilatation of the  aortic root and of the ascending aorta measuring 38 mm.  Venous: The inferior vena cava measures 1.10 cm, is normal in size with  greater than 50% respiratory variability.    LHC 11/15 Left mainstem: the left mainstem has moderate calcification. The vessel has 20-30% stenosis in its midportion. The ostium of the left main is widely patent. The distal left main is widely patent. The stenosis in the left mainstem appears very mild in severity. The vessel divides into the LAD and left circumflex. Left anterior descending (LAD): the LAD is medium in caliber. The vessel reaches the LV apex. The proximal vessel has diffuse irregularity with mild calcification. The first diagonal has 30-40% stenosis at its ostium. The mid LAD at that bifurcation also has 30-40% stenosis. The remaining portions of the LAD have mild nonobstructive disease. Left circumflex (LCx): the left circumflex is patent. The mid vessel has 40% stenosis. There is a tiny obtuse marginal branch that has 50% stenosis. The major OM branch is widely patent with minor nonobstructive disease. Right coronary artery (RCA): this is a dominant vessel. There is diffuse calcification and irregularity but there are no high-grade stenoses present. There is no more than about 20 or 30% stenosis which is most significant at the junction of the mid and distal RCA. The PDA and PLA branches are patent. Left ventriculography: Left ventricular systolic function is normal, LVEF is estimated at 55%, there is no significant mitral regurgitation  Estimated Blood Loss: minimal   Final Conclusions:   1. Diffusely calcified coronary arteries with only mild nonobstructive CAD 2. Normal left ventricular systolic function with normal LVEDP   Recommendations: medical management of nonobstructive CAD.  Recent Labs: 02/22/2020: BUN 17; Creatinine, Ser 1.25; Magnesium 2.0; Potassium 3.8; Sodium 141 08/25/2020: Hemoglobin 14.7; Platelets 220  No results found for  requested labs within last 8760 hours.   CrCl cannot be calculated (Patient's most recent lab result is older than the maximum 21 days allowed.).   Wt Readings from Last 3 Encounters:  01/10/21 209 lb 12.8 oz (95.2 kg)  09/01/20 223 lb (101.2 kg)  02/22/20 222 lb 9.6 oz (101 kg)     Other studies reviewed: Additional studies/records reviewed today include: summarized above  ASSESSMENT AND PLAN:  Paroxysmal  Afib CHA2DS2Vasc is 4, on Eliquis, appropriately dosed PVCs On sotalol Both well controlled QTc stable Update labs today  Asymptomatic bradycardia No changes Discussed symptoms of bradycardia to make [place] aware of or seek attention for  3.   NICM Recovered LVEF by last echo No symptoms or exam findings of volume OL  4. HTN On several meds, he would be interested in simplifying this regime, will leave to general cardiology team [facility], has follow up with him in place for management.  Disposition: F/u with EP in 53mo, sooner if needed  Current medicines are reviewed at length with the patient today.  The patient did not have any concerns regarding medicines.  Venetia Night, PA-C 01/10/2021 5:20 PM     [address] [address] [phone] (office)  [phone] (fax)

## 2021-01-11 ENCOUNTER — Ambulatory Visit: Payer: Medicare Other | Admitting: Physician Assistant

## 2021-01-11 ENCOUNTER — Encounter: Payer: Self-pay | Admitting: Physician Assistant

## 2021-01-11 VITALS — BP 144/66 | HR 50 | Ht 69.0 in | Wt 210.0 lb

## 2021-01-11 DIAGNOSIS — Z79899 Other long term (current) drug therapy: Secondary | ICD-10-CM

## 2021-01-11 DIAGNOSIS — I493 Ventricular premature depolarization: Secondary | ICD-10-CM

## 2021-01-11 DIAGNOSIS — R001 Bradycardia, unspecified: Secondary | ICD-10-CM

## 2021-01-11 DIAGNOSIS — I480 Paroxysmal atrial fibrillation: Secondary | ICD-10-CM

## 2021-01-11 DIAGNOSIS — Z5181 Encounter for therapeutic drug level monitoring: Secondary | ICD-10-CM | POA: Diagnosis not present

## 2021-01-11 DIAGNOSIS — K648 Other hemorrhoids: Secondary | ICD-10-CM | POA: Diagnosis not present

## 2021-01-11 NOTE — Patient Instructions (Signed)
Medication Instructions:    Your physician recommends that you continue on your current medications as directed. Please refer to the Current Medication list given to you today.  *If you need a refill on your cardiac medications before your next appointment, please call your pharmacy*   Lab Work: BMET [place]   If you have labs (blood work) drawn today and your tests are completely normal, you will receive your results only by: [place] (if you have MyChart) OR A paper copy in the mail If you have any lab test that is abnormal or we need to change your treatment, we will call you to review the results.   Testing/Procedures: NONE ORDERED  TODAY    Follow-Up: At [facility], you and your health needs are our priority.  As part of our continuing mission to provide you with exceptional heart care, we have created designated Provider Care Teams.  These Care Teams include your primary Cardiologist (physician) and Advanced Practice Providers (APPs -  Physician Assistants and Nurse Practitioners) who all work together to provide you with the care you need, when you need it.  We recommend signing up for the patient portal called "MyChart".  Sign up information is provided on this After Visit Summary.  MyChart is used to connect with patients for Virtual Visits (Telemedicine).  Patients are able to view lab/test results, encounter notes, upcoming appointments, etc.  Non-urgent messages can be sent to your provider as well.   To learn more about what you can do with MyChart, go to [email or web address].    Your next appointment:   6 month(s)  The format for your next appointment:   In Person  Provider:   You may see Will [name], MD or one of the following Advanced Practice Providers on your designated Care Team:   Tommye Standard, [place] Legrand Como "Jonni Sanger" Chalmers Cater, [place]   Other Instructions

## 2021-01-12 LAB — BASIC METABOLIC PANEL
BUN/Creatinine Ratio: 17 (ref 10–24)
BUN: 19 mg/dL (ref 8–27)
CO2: 24 mmol/L (ref 20–29)
Calcium: 9.7 mg/dL (ref 8.6–10.2)
Chloride: 104 mmol/L (ref 96–106)
Creatinine, Ser: 1.11 mg/dL (ref 0.76–1.27)
Glucose: 100 mg/dL — ABNORMAL HIGH (ref 70–99)
Potassium: 3.9 mmol/L (ref 3.5–5.2)
Sodium: 142 mmol/L (ref 134–144)
eGFR: 70 mL/min/{1.73_m2} (ref >59)

## 2021-01-12 LAB — CBC
Hematocrit: 44.9 % (ref 37.5–51.0)
Hemoglobin: 15.4 g/dL (ref 13.0–17.7)
MCH: 29.4 pg (ref 26.6–33.0)
MCHC: 34.3 g/dL (ref 31.5–35.7)
MCV: 86 fL (ref 79–97)
Platelets: 245 10*3/uL (ref 150–450)
RBC: 5.23 x10E6/uL (ref 4.14–5.80)
RDW: 13.2 % (ref 11.6–15.4)
WBC: 6.2 10*3/uL (ref 3.4–10.8)

## 2021-01-12 LAB — MAGNESIUM: Magnesium: 2.2 mg/dL (ref 1.6–2.3)

## 2021-01-17 ENCOUNTER — Other Ambulatory Visit: Payer: Self-pay | Admitting: Cardiovascular Disease

## 2021-01-19 MED ORDER — DOXAZOSIN MESYLATE 4 MG PO TABS: 4.0000 mg | ORAL_TABLET | Freq: Two times a day (BID) | ORAL | 11 refills | Status: DC

## 2021-01-19 NOTE — Telephone Encounter (Signed)
Spoke with pt and will try taking the Doxazosin 4 mg bid and will check B/P  and send readings via mychart for review in the next week or so ./cy

## 2021-01-22 ENCOUNTER — Other Ambulatory Visit: Payer: Self-pay | Admitting: Cardiovascular Disease

## 2021-01-22 DIAGNOSIS — I10 Essential (primary) hypertension: Secondary | ICD-10-CM

## 2021-01-22 DIAGNOSIS — I2510 Atherosclerotic heart disease of native coronary artery without angina pectoris: Secondary | ICD-10-CM

## 2021-01-23 NOTE — Telephone Encounter (Signed)
Eliquis 5mg  refill request received. Patient is 76 years old, weight-95.3kg, Crea-1.11 on 01/11/2021, Diagnosis-Afib, and last seen by Tommye Standard on 01/11/2021. Dose is appropriate based on dosing criteria. Will send in refill to requested pharmacy.

## 2021-02-07 ENCOUNTER — Other Ambulatory Visit: Payer: Self-pay | Admitting: Nurse Practitioner

## 2021-02-07 MED ORDER — AMLODIPINE BESYLATE 10 MG PO TABS: 10.0000 mg | ORAL_TABLET | Freq: Every day | ORAL | 3 refills | Status: DC

## 2021-02-07 MED ORDER — HYDROCHLOROTHIAZIDE 25 MG PO TABS: 25.0000 mg | ORAL_TABLET | Freq: Every day | ORAL | 3 refills | Status: DC

## 2021-02-10 DIAGNOSIS — H04123 Dry eye syndrome of bilateral lacrimal glands: Secondary | ICD-10-CM | POA: Diagnosis not present

## 2021-03-09 ENCOUNTER — Ambulatory Visit: Payer: Medicare Other

## 2021-03-12 ENCOUNTER — Other Ambulatory Visit: Payer: Self-pay | Admitting: Cardiovascular Disease

## 2021-03-13 ENCOUNTER — Telehealth: Payer: Self-pay

## 2021-03-13 NOTE — Telephone Encounter (Signed)
Called and lmom the pt to call back to r/s ?

## 2021-03-15 NOTE — Telephone Encounter (Signed)
2nd lmom to r/s ?

## 2021-03-22 ENCOUNTER — Ambulatory Visit: Payer: Medicare Other

## 2021-03-24 ENCOUNTER — Other Ambulatory Visit: Payer: Self-pay

## 2021-03-24 ENCOUNTER — Ambulatory Visit (INDEPENDENT_AMBULATORY_CARE_PROVIDER_SITE_OTHER): Payer: Medicare Other | Admitting: Pharmacist Clinician (PhC)/ Clinical Pharmacy Specialist

## 2021-03-24 DIAGNOSIS — I10 Essential (primary) hypertension: Secondary | ICD-10-CM

## 2021-03-24 MED ORDER — AMLODIPINE-OLMESARTAN 10-40 MG PO TABS: 1.0000 | ORAL_TABLET | Freq: Every day | ORAL | 3 refills | Status: DC

## 2021-03-24 MED ORDER — CHLORTHALIDONE 25 MG PO TABS: 25.0000 mg | ORAL_TABLET | Freq: Every day | ORAL | 3 refills | Status: DC

## 2021-03-24 NOTE — Patient Instructions (Signed)
Return for a a follow up appointment May 22 at 9 am ? ?Check your blood pressure at home daily and keep record of the readings. ? ?Take your BP meds as follows: ? Stop hydrochlorothiazide 25 mg ? Start chlorthalidone 25 mg once daily in the mornings ? When you run out of amlodipine and olmesartan, pick up the combination tablet amlodipine/olmesartan 10/40 mg  ?  ? AM:  chlorthalidone 25 mg, doxazosin 4 mg ? PM:  amlodipine/olmesartan 10/40 mg, doxazosin 4 mg  ? ?Bring all of your meds, your BP cuff and your record of home blood pressures to your next appointment.  Exercise as you?re able, try to walk approximately 30 minutes per day.  Keep salt intake to a minimum, especially watch canned and prepared boxed foods.  Eat more fresh fruits and vegetables and fewer canned items.  Avoid eating in fast food restaurants.  ? ? HOW TO TAKE YOUR BLOOD PRESSURE: ?Rest 5 minutes before taking your blood pressure. ? Don?t smoke or drink caffeinated beverages for at least 30 minutes before. ?Take your blood pressure before (not after) you eat. ?Sit comfortably with your back supported and both feet on the floor (don?t cross your legs). ?Elevate your arm to heart level on a table or a desk. ?Use the proper sized cuff. It should fit smoothly and snugly around your bare upper arm. There should be enough room to slip a fingertip under the cuff. The bottom edge of the cuff should be 1 inch above the crease of the elbow. ?Ideally, take 3 measurements at one sitting and record the average. ? ? ?

## 2021-03-24 NOTE — Progress Notes (Signed)
? ? ? ?03/24/2021 ?[name] ?Sep 30, 1945 ?[id number] ? ? ?HPI:  NED KAKAR is a 76 y.o. male patient of Dr Acie Fredrickson, with a PMH below who presents today for hypertension clinic evaluation.  Patient was most recently seen by Tommye Standard, (in January), at which time his blood pressure was 144/66.  He was taking amlodipine, hctz, olmesartan and doxazosin.  He is hoping to simplify his medication regimen.  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  Admits to checking home blood pressure only occasionally, and mostly sees numbers in the 150's, although it does spike to the 170's.  He does endorse the occasional lightheadedness, usually only in the mornings if he gets out of bed too fast.      ? ?Past Medical History: ?AF Paroxysmal, CHADS2-VASc of 4 on Eliquis  ?PVC's On sotaolol  ?hyperlipidemia 07/2017 LDL 61 on atorvastatin 40 mg  ?CAD Non-obstructive by cath in 2015  ?  ? ?Blood Pressure Goal:  130/80 ? ?Current Medications: amlodipine 10 mg qd, olmesartan 40 mg qd, hctz 25 mg qd, doxazosin 4 mg bid ? ?Family Hx: pgf died from heart disease at 75, father at 56, mother died in 50's after hip fracture/parkinsons; 2 brothers both on medication; 2 children healthy ? ?Social Hx: no, occasional alcohol, soda on occasion, decaf coffee most days, plenty of water ? ?Diet: bus driver for [employer], lots of [place]; tries to look for vegetables ? ?Exercise: no regular exercise ? ?Home BP readings: no readings with him today.  Home cuff is about 75 years old, arm cuff.  States mostly sees 038'U systolic, with occasional spikes to 170's.  Diastolic readings all WNL ? ?Intolerances: nkda ? ?Labs: 1/23: Na 142, K 3.9, Glu 100, BUN 19, SCr 1.11, GFR 70 ? ? ?Wt Readings from Last 3 Encounters:  ?03/24/21 212 lb (96.2 kg)  ?01/11/21 210 lb (95.3 kg)  ?01/10/21 209 lb 12.8 oz (95.2 kg)  ? ?BP Readings from Last 3 Encounters:  ?03/24/21 (!) 146/62  ?01/11/21  (!) 144/66  ?01/10/21 (!) 110/52  ? ?Pulse Readings from Last 3 Encounters:  ?03/24/21 (!) 52  ?01/11/21 (!) 50  ?01/10/21 (!) 48  ? ? ?Current Outpatient Medications  ?Medication Sig Dispense Refill  ? amLODipine-olmesartan (AZOR) 10-40 MG tablet Take 1 tablet by mouth daily. 90 tablet 3  ? atorvastatin (LIPITOR) 40 MG tablet TAKE 1 TABLET BY MOUTH DAILY. 90 tablet 3  ? chlorthalidone (HYGROTON) 25 MG tablet Take 1 tablet (25 mg total) by mouth daily. 90 tablet 3  ? Cholecalciferol (D3-1000) 25 MCG (1000 UT) tablet Take 1,000 Units by mouth daily.    ? Co-Enzyme Q-10 100 MG CAPS Take 100 mg by mouth daily.     ? Cyanocobalamin (VITAMIN B-12 PO) Take 1 tablet by mouth daily.    ? diazepam (VALIUM) 5 MG tablet Take 5 mg by mouth as needed.    ? doxazosin (CARDURA) 4 MG tablet Take 1 tablet (4 mg total) by mouth 2 (two) times daily. 60 tablet 11  ? ELIQUIS 5 MG TABS tablet TAKE 1 TABLET BY MOUTH TWICE DAILY. 60 tablet 5  ? fexofenadine (ALLEGRA) 180 MG tablet Take 180 mg by mouth daily as needed for allergies.     ? multivitamin-iron-minerals-folic acid (CENTRUM) chewable tablet Chew 1 tablet by mouth daily.    ? Omega-3 Fatty Acids (FISH OIL PO) Take 1,000 mg  by mouth daily.    ? omeprazole (PRILOSEC) 40 MG capsule Take 40 mg by mouth daily as needed (for acid reflux).    ? OVER THE COUNTER MEDICATION Take 1 tablet by mouth daily. Super Beet supplement    ? potassium chloride (KLOR-CON) 10 MEQ tablet TAKE 1 TABLET EACH DAY. 90 tablet 3  ? sotalol (BETAPACE) 80 MG tablet TAKE 1 TABLET BY MOUTH TWICE DAILY. 60 tablet 11  ? SYNTHROID 100 MCG tablet Take 100 mcg by mouth daily before breakfast.     ? traZODone (DESYREL) 50 MG tablet Take 50 mg by mouth at bedtime.    ? zolpidem (AMBIEN) 10 MG tablet Take 10 mg by mouth at bedtime.    ? ?No current facility-administered medications for this visit.  ? ? ?No Known Allergies ? ?Past Medical History:  ?Diagnosis Date  ? BPH (benign prostatic hyperplasia)   ? CAD in native  artery   ? Complication of anesthesia   ? gets really anxious when waking up  ? ED (erectile dysfunction)   ? Hyperlipidemia   ? Hypertension   ? Hypothyroidism   ? Osteoarthritis   ? PVC's (premature ventricular contractions)   ? 3-day cardiac monitor 07/2018: NSR, frequent PVCs (PVC burden 15.9%); rare PACs and rare episode of non-sustained SVT  //  Echo 08/2018: EF 55-60, mild LVH, normal RV SF, mild LAE, mild dilation of aortic root and ascending aorta (38 mm)   ? Thyroid disease   ? ? ?Blood pressure (!) 146/62, pulse (!) 52, height '5\' 9"'$  (1.753 m), weight 212 lb (96.2 kg). ? ?Essential hypertension ?Patient with essential hypertension, still not at goal on 4 medications.  Will combine amlodipine and olmesartan for easier compliance and have him take this at night.  Will switch hctz to chlorthalidone 25 mg once daily and have him take this in the mornings.  Continue doxazosin twice daily.  Asked that he keep a home BP log and return in 2 months for follow up.  At that time he should bring home cuff so we can determine validity of home readings.   ? ? ?Tommy Medal PharmD CPP [facility] ?[address] ?[place], [place] [address] ?[phone] ?

## 2021-03-24 NOTE — Assessment & Plan Note (Signed)
Patient with essential hypertension, still not at goal on 4 medications.  Will combine amlodipine and olmesartan for easier compliance and have him take this at night.  Will switch hctz to chlorthalidone 25 mg once daily and have him take this in the mornings.  Continue doxazosin twice daily.  Asked that he keep a home BP log and return in 2 months for follow up.  At that time he should bring home cuff so we can determine validity of home readings.   ?

## 2021-05-03 DIAGNOSIS — K648 Other hemorrhoids: Secondary | ICD-10-CM | POA: Diagnosis not present

## 2021-05-16 DIAGNOSIS — D225 Melanocytic nevi of trunk: Secondary | ICD-10-CM | POA: Diagnosis not present

## 2021-05-16 DIAGNOSIS — D1801 Hemangioma of skin and subcutaneous tissue: Secondary | ICD-10-CM | POA: Diagnosis not present

## 2021-05-16 DIAGNOSIS — L218 Other seborrheic dermatitis: Secondary | ICD-10-CM | POA: Diagnosis not present

## 2021-05-16 DIAGNOSIS — L814 Other melanin hyperpigmentation: Secondary | ICD-10-CM | POA: Diagnosis not present

## 2021-05-16 DIAGNOSIS — L821 Other seborrheic keratosis: Secondary | ICD-10-CM | POA: Diagnosis not present

## 2021-05-16 DIAGNOSIS — D2362 Other benign neoplasm of skin of left upper limb, including shoulder: Secondary | ICD-10-CM | POA: Diagnosis not present

## 2021-05-19 DIAGNOSIS — I480 Paroxysmal atrial fibrillation: Secondary | ICD-10-CM | POA: Diagnosis not present

## 2021-05-19 DIAGNOSIS — E039 Hypothyroidism, unspecified: Secondary | ICD-10-CM | POA: Diagnosis not present

## 2021-05-19 DIAGNOSIS — R7303 Prediabetes: Secondary | ICD-10-CM | POA: Diagnosis not present

## 2021-05-19 DIAGNOSIS — K219 Gastro-esophageal reflux disease without esophagitis: Secondary | ICD-10-CM | POA: Diagnosis not present

## 2021-05-19 DIAGNOSIS — E7800 Pure hypercholesterolemia, unspecified: Secondary | ICD-10-CM | POA: Diagnosis not present

## 2021-05-19 DIAGNOSIS — I2510 Atherosclerotic heart disease of native coronary artery without angina pectoris: Secondary | ICD-10-CM | POA: Diagnosis not present

## 2021-05-19 DIAGNOSIS — Z0000 Encounter for general adult medical examination without abnormal findings: Secondary | ICD-10-CM | POA: Diagnosis not present

## 2021-05-19 DIAGNOSIS — I129 Hypertensive chronic kidney disease with stage 1 through stage 4 chronic kidney disease, or unspecified chronic kidney disease: Secondary | ICD-10-CM | POA: Diagnosis not present

## 2021-05-19 DIAGNOSIS — I493 Ventricular premature depolarization: Secondary | ICD-10-CM | POA: Diagnosis not present

## 2021-05-22 ENCOUNTER — Ambulatory Visit: Payer: Medicare Other | Admitting: Pharmacist Clinician (PhC)/ Clinical Pharmacy Specialist

## 2021-05-22 DIAGNOSIS — I10 Essential (primary) hypertension: Secondary | ICD-10-CM

## 2021-05-22 NOTE — Progress Notes (Signed)
05/22/2021 IAIN SAWCHUK [birth year] [id number]   HPI:  Logan Sanders is a 76 y.o. male patient of Dr Acie Fredrickson, with a PMH below who presents today for hypertension clinic evaluation.  Patient was most recently seen by Tommye Standard, (in January), at which time his blood pressure was 144/66.  He was taking amlodipine, hctz, olmesartan and doxazosin.  He is hoping to simplify his medication regimen.  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  Admits to checking home blood pressure only occasionally, and mostly sees numbers in the 150's, although it does spike to the 170's.  He does endorse the occasional lightheadedness, usually only in the mornings if he gets out of bed too fast.    At his last visit we combined amlodipine and olmesartan into 1 tablet for ease of use, switched hctz to chlorthalidone, and had him continue with doxazosin.    He returns today for follow up.  Has done well with the new regimen and reports no problems or side effects since last visit.  He started taking arginine, as he was told it could provide cardiac benefit, but no other medication changes.    Past Medical History: AF Paroxysmal, CHADS2-VASc of 4 on Eliquis  PVC's On sotaolol  hyperlipidemia 07/2017 LDL 61 on atorvastatin 40 mg  CAD Non-obstructive by cath in 2015     Blood Pressure Goal:  130/80  Current Medications: amlodipine/olmesartan 10/40 mg qhs; chlorthalidone 25 mg qam, doxazosin 4 mg bid  Family Hx: pgf died from heart disease at 40, father at 66, mother died in 63's after hip fracture/parkinsons; 2 brothers both on medication; 2 children healthy  Social Hx: no, occasional alcohol, soda on occasion, decaf coffee most days, plenty of water  Diet: bus driver for [employer], lots of [place]; tries to look for vegetables  Exercise: no regular exercise  Home BP readings: just bought Omron cuff last month, read within 5 points of  office; only 1 reading> 485 systolic  12 AM readings average 124/63  (range 101-155/55-71)     8 PM readings average 121/63  )range 92-140/53-76)  Intolerances: nkda  Labs: 1/23: Na 142, K 3.9, Glu 100, BUN 19, SCr 1.11, GFR 70   Wt Readings from Last 3 Encounters:  05/22/21 204 lb (92.5 kg)  03/24/21 212 lb (96.2 kg)  01/11/21 210 lb (95.3 kg)   BP Readings from Last 3 Encounters:  05/22/21 134/72  03/24/21 (!) 146/62  01/11/21 (!) 144/66   Pulse Readings from Last 3 Encounters:  05/22/21 (!) 56  03/24/21 (!) 52  01/11/21 (!) 50    Current Outpatient Medications  Medication Sig Dispense Refill   amLODipine-olmesartan (AZOR) 10-40 MG tablet Take 1 tablet by mouth daily. 90 tablet 3   arginine 500 MG tablet Take 500 mg by mouth daily.     atorvastatin (LIPITOR) 40 MG tablet TAKE 1 TABLET BY MOUTH DAILY. 90 tablet 3   chlorthalidone (HYGROTON) 25 MG tablet Take 1 tablet (25 mg total) by mouth daily. 90 tablet 3   Cholecalciferol (D3-1000) 25 MCG (1000 UT) tablet Take 1,000 Units by mouth daily.     Co-Enzyme Q-10 100 MG CAPS Take 100 mg by mouth daily.      Cyanocobalamin (VITAMIN B-12 PO) Take 1 tablet by mouth daily.     diazepam (VALIUM) 5 MG tablet Take 5 mg by mouth as needed.  doxazosin (CARDURA) 4 MG tablet Take 1 tablet (4 mg total) by mouth 2 (two) times daily. 60 tablet 11   ELIQUIS 5 MG TABS tablet TAKE 1 TABLET BY MOUTH TWICE DAILY. 60 tablet 5   fexofenadine (ALLEGRA) 180 MG tablet Take 180 mg by mouth daily as needed for allergies.      multivitamin-iron-minerals-folic acid (CENTRUM) chewable tablet Chew 1 tablet by mouth daily.     Omega-3 Fatty Acids (FISH OIL PO) Take 1,000 mg by mouth daily.     omeprazole (PRILOSEC) 40 MG capsule Take 40 mg by mouth daily as needed (for acid reflux).     OVER THE COUNTER MEDICATION Take 1 tablet by mouth daily. Super Beet supplement     potassium chloride (KLOR-CON) 10 MEQ tablet TAKE 1 TABLET EACH DAY. 90 tablet 3    sotalol (BETAPACE) 80 MG tablet TAKE 1 TABLET BY MOUTH TWICE DAILY. 60 tablet 11   SYNTHROID 100 MCG tablet Take 100 mcg by mouth daily before breakfast.      traZODone (DESYREL) 50 MG tablet Take 50 mg by mouth at bedtime.     zolpidem (AMBIEN) 10 MG tablet Take 10 mg by mouth at bedtime.     No current facility-administered medications for this visit.    No Known Allergies  Past Medical History:  Diagnosis Date   BPH (benign prostatic hyperplasia)    CAD in native artery    Complication of anesthesia    gets really anxious when waking up   ED (erectile dysfunction)    Hyperlipidemia    Hypertension    Hypothyroidism    Osteoarthritis    PVC's (premature ventricular contractions)    3-day cardiac monitor 07/2018: NSR, frequent PVCs (PVC burden 15.9%); rare PACs and rare episode of non-sustained SVT  //  Echo 08/2018: EF 55-60, mild LVH, normal RV SF, mild LAE, mild dilation of aortic root and ascending aorta (38 mm)    Thyroid disease     Blood pressure 134/72, pulse (!) 56, resp. rate 17, height '5\' 9"'$  (1.753 m), weight 204 lb (92.5 kg), SpO2 97 %.  Essential hypertension Patient with essential hypertension, now doing well on combination of 4 medications.  No changes today and will have him continue to monitor 2-3 times per week as able.  If he has any questions or concerns, he knows to reach out to the office.     Tommy Medal PharmD CPP [facility] [address] [place], [place] [address] [phone]

## 2021-05-22 NOTE — Assessment & Plan Note (Signed)
Patient with essential hypertension, now doing well on combination of 4 medications.  No changes today and will have him continue to monitor 2-3 times per week as able.  If he has any questions or concerns, he knows to reach out to the office.

## 2021-05-22 NOTE — Patient Instructions (Signed)
Call if you have any concerns about your BP readings or medications.  Labron Bloodgood/Chris at [phone]  Check your blood pressure at home daily (if able) and keep record of the readings.  Take your BP meds as follows:   Continue with your current medications  Bring all of your meds, your BP cuff and your record of home blood pressures to your next appointment.  Exercise as you're able, try to walk approximately 30 minutes per day.  Keep salt intake to a minimum, especially watch canned and prepared boxed foods.  Eat more fresh fruits and vegetables and fewer canned items.  Avoid eating in fast food restaurants.    HOW TO TAKE YOUR BLOOD PRESSURE: Rest 5 minutes before taking your blood pressure.  Don't smoke or drink caffeinated beverages for at least 30 minutes before. Take your blood pressure before (not after) you eat. Sit comfortably with your back supported and both feet on the floor (don't cross your legs). Elevate your arm to heart level on a table or a desk. Use the proper sized cuff. It should fit smoothly and snugly around your bare upper arm. There should be enough room to slip a fingertip under the cuff. The bottom edge of the cuff should be 1 inch above the crease of the elbow. Ideally, take 3 measurements at one sitting and record the average.

## 2021-07-09 NOTE — Progress Notes (Signed)
Cardiology Office Note Date:  07/09/2021  Patient ID:  Logan, Sanders 01-Sep-1945, MRN [id number] PCP:  Maurice Small, MD  Cardiologist:  Dr. Eden Emms Electrophysiologist: Dr. Elberta Fortis     Chief Complaint:  6 mo  History of Present Illness: Logan Sanders is a 76 y.o. male with history of AFib, CAD (non-obstructive by cath 2015), HTN, HLD, hypothyroidism, PVCs, NICM (felt 2/2 PVCs (?)).  Note that he had improvement in his EF prior to sotalol and partial ablation procedure.  He comes in today to be seen for Dr. Elberta Fortis, last seen by him in Sept 2022, was doing well on sotalol with minimal if any symptoms of AFib or his PVCs.  He saw Benjamine Sprague, NP 01/10/21, feeling well, succeeding at weight loss efforts.  Noted to be bradycardiac  (without symptoms) and recommended to f/u with EP  I saw him 01/11/21 He feels well Drink 2-3 bottles of water a day only, occassionally has a few seconds of lightheadedness upon standing only. No dizziness, near syncope or syncope otherwise  He has never felt lightheaded seated or supine. He feels like his PVCs are well controlled Has an infrequent funny feeling, maybe a gurgling in his chest that resolves with belching and known GERD He does not formally exercise though is busy around the house, yard, does take walks. Continues to work, drives a tour bus, gets on/off periodically Denies any exertional intolerances or changes to his exertional capacity. No bleeding or signs of bleeding EKG SB 50bpm,stable QTc, with no symptoms, no changes were made. He had f/u with cardiology in place, planned to discuss if able to simplify his BP/medication regime.  He has followed with pharmacy team/BP clinic for BP, last on 05/22/21, BP was better, advised to monitor BP 2-3x a week, no changes were made.  TODAY He continues to do well Still working and active, no formal exercise but busy with no exertional incapacities or changes. He will occasionally get a  funny feeling in his chest, thinks it is his GERD/GI because when he burps it goes away. No CP otherwise No SOB No near syncope or syncope. No bleeding or signs of bleeding outside of some easy arm bruising No palpitations or cardiac awareness  AFib/PVC hx AFib diagnosed 2017 EPS for PVCs, incomplete ablation 2/2 to proximity to the RCA Sotalol started Nov 2020   Past Medical History:  Diagnosis Date   BPH (benign prostatic hyperplasia)    CAD in native artery    Complication of anesthesia    gets really anxious when waking up   ED (erectile dysfunction)    Hyperlipidemia    Hypertension    Hypothyroidism    Osteoarthritis    PVC's (premature ventricular contractions)    3-day cardiac monitor 07/2018: NSR, frequent PVCs (PVC burden 15.9%); rare PACs and rare episode of non-sustained SVT  //  Echo 08/2018: EF 55-60, mild LVH, normal RV SF, mild LAE, mild dilation of aortic root and ascending aorta (38 mm)    Thyroid disease     Past Surgical History:  Procedure Laterality Date   CARDIAC CATHETERIZATION  11/09/13   non obstructive disease in all vessels.   COLONOSCOPY     KNEE ARTHROSCOPY Left    x3   LEFT HEART CATHETERIZATION WITH CORONARY ANGIOGRAM N/A 11/09/2013   Procedure: LEFT HEART CATHETERIZATION WITH CORONARY ANGIOGRAM;  Surgeon: Micheline Chapman, MD;  Location: [facility] CATH LAB;  Service: Cardiovascular;  Laterality: N/A;   LOWER BACK SURGERY  1985   LUMBAR LAMINECTOMY/DECOMPRESSION MICRODISCECTOMY N/A 12/18/2017   Procedure: LAMINECTOMY AND FORAMINOTOMY LUMBAR TWO- LUMBAR THREE, LUMBAR THREE- LUMBAR FOUR, LUMBAR FOUR- LUMBAR FIVE;  Surgeon: Tressie Stalker, MD;  Location: [facility] OR;  Service: Neurosurgery;  Laterality: N/A;   NECK SURGERY  1999   PVC ABLATION N/A 10/07/2018   Procedure: PVC ABLATION;  Surgeon: Regan Lemming, MD;  Location: [facility] INVASIVE CV LAB;  Service: Cardiovascular;  Laterality: N/A;   REPLACEMENT TOTAL KNEE  2005   LEFT KNEE   WISDOM TOOTH  EXTRACTION      Current Outpatient Medications  Medication Sig Dispense Refill   amLODipine-olmesartan (AZOR) 10-40 MG tablet Take 1 tablet by mouth daily. 90 tablet 3   arginine 500 MG tablet Take 500 mg by mouth daily.     atorvastatin (LIPITOR) 40 MG tablet TAKE 1 TABLET BY MOUTH DAILY. 90 tablet 3   chlorthalidone (HYGROTON) 25 MG tablet Take 1 tablet (25 mg total) by mouth daily. 90 tablet 3   Cholecalciferol (D3-1000) 25 MCG (1000 UT) tablet Take 1,000 Units by mouth daily.     Co-Enzyme Q-10 100 MG CAPS Take 100 mg by mouth daily.      Cyanocobalamin (VITAMIN B-12 PO) Take 1 tablet by mouth daily.     diazepam (VALIUM) 5 MG tablet Take 5 mg by mouth as needed.     doxazosin (CARDURA) 4 MG tablet Take 1 tablet (4 mg total) by mouth 2 (two) times daily. 60 tablet 11   ELIQUIS 5 MG TABS tablet TAKE 1 TABLET BY MOUTH TWICE DAILY. 60 tablet 5   fexofenadine (ALLEGRA) 180 MG tablet Take 180 mg by mouth daily as needed for allergies.      multivitamin-iron-minerals-folic acid (CENTRUM) chewable tablet Chew 1 tablet by mouth daily.     Omega-3 Fatty Acids (FISH OIL PO) Take 1,000 mg by mouth daily.     omeprazole (PRILOSEC) 40 MG capsule Take 40 mg by mouth daily as needed (for acid reflux).     OVER THE COUNTER MEDICATION Take 1 tablet by mouth daily. Super Beet supplement     potassium chloride (KLOR-CON) 10 MEQ tablet TAKE 1 TABLET EACH DAY. 90 tablet 3   sotalol (BETAPACE) 80 MG tablet TAKE 1 TABLET BY MOUTH TWICE DAILY. 60 tablet 11   SYNTHROID 100 MCG tablet Take 100 mcg by mouth daily before breakfast.      traZODone (DESYREL) 50 MG tablet Take 50 mg by mouth at bedtime.     zolpidem (AMBIEN) 10 MG tablet Take 10 mg by mouth at bedtime.     No current facility-administered medications for this visit.    Allergies:   Patient has no known allergies.   Social History:  The patient  reports that he quit smoking about 22 years ago. His smoking use included cigarettes. He has a 2.50  pack-year smoking history. He quit smokeless tobacco use about 25 years ago. He reports current alcohol use of about 1.0 standard drink of alcohol per week. He reports that he does not use drugs.   Family History:  The patient's family history includes Asthma in his mother; Heart attack in his father; Heart disease in his father; Hyperlipidemia in his brother; Parkinsonism in his mother; Prostate cancer in his father.  ROS:  Please see the history of present illness.    All other systems are reviewed and otherwise negative.   PHYSICAL EXAM:  VS:  There were no vitals taken for this visit. BMI: There is  no height or weight on file to calculate BMI. Well nourished, well developed, in no acute distress HEENT: normocephalic, atraumatic Neck: no JVD, carotid bruits or masses Cardiac:  RRR; bradycardic, no significant murmurs, no rubs, or gallops Lungs:  CTA b/l, no wheezing, rhonchi or rales Abd: soft, nontender MS: no deformity or atrophy Ext: no edema Skin: warm and dry, no rash Neuro:  No gross deficits appreciated Psych: euthymic mood, full affect   EKG:  Done today and reviewed by myself shows  SB 57bpm, QTc    10/07/2018: EPS/ablation CONCLUSIONS:  1. Sinus rhythm upon presentation.  2.  Ablation of PVCs in the right ventricular outflow tract and right coronary cusp 3.  Reduced PVC burden 4. No inducible arrhythmias following ablation.  5. No early apparent complications.   Echo 08/04/18 Left Ventricle: The left ventricle has normal systolic function, with an  ejection fraction of 55-60%. The cavity size was normal. There is mildly  increased left ventricular wall thickness. Left ventricular diastolic  parameters were normal.  Right Ventricle: The right ventricle has normal systolic function. The  cavity was normal.  Left Atrium: Left atrial size was mildly dilated.  Right Atrium: Right atrial size was normal in size. Right atrial pressure  is estimated at 10 mmHg.   Interatrial Septum: No atrial level shunt detected by color flow Doppler.  Pericardium: There is no evidence of pericardial effusion.  Mitral Valve: The mitral valve is abnormal. Mild thickening of the mitral  valve leaflet. Mitral valve regurgitation is not visualized by color flow  Doppler.  Tricuspid Valve: The tricuspid valve is grossly normal. Tricuspid valve  regurgitation is trivial by color flow Doppler.  Aortic Valve: The aortic valve is tricuspid Mild thickening of the aortic  valve. Aortic valve regurgitation was not visualized by color flow  Doppler. There is No stenosis of the aortic valve.  Pulmonic Valve: The pulmonic valve was not well visualized. Pulmonic valve  regurgitation is not visualized by color flow Doppler.  Aorta: The aorta is abnormal in size and structure. There is mild  dilatation of the aortic root and of the ascending aorta measuring 38 mm.  Venous: The inferior vena cava measures 1.10 cm, is normal in size with  greater than 50% respiratory variability.    LHC 11/15 Left mainstem: the left mainstem has moderate calcification. The vessel has 20-30% stenosis in its midportion. The ostium of the left main is widely patent. The distal left main is widely patent. The stenosis in the left mainstem appears very mild in severity. The vessel divides into the LAD and left circumflex. Left anterior descending (LAD): the LAD is medium in caliber. The vessel reaches the LV apex. The proximal vessel has diffuse irregularity with mild calcification. The first diagonal has 30-40% stenosis at its ostium. The mid LAD at that bifurcation also has 30-40% stenosis. The remaining portions of the LAD have mild nonobstructive disease. Left circumflex (LCx): the left circumflex is patent. The mid vessel has 40% stenosis. There is a tiny obtuse marginal branch that has 50% stenosis. The major OM branch is widely patent with minor nonobstructive disease. Right coronary artery (RCA): this  is a dominant vessel. There is diffuse calcification and irregularity but there are no high-grade stenoses present. There is no more than about 20 or 30% stenosis which is most significant at the junction of the mid and distal RCA. The PDA and PLA branches are patent. Left ventriculography: Left ventricular systolic function is normal, LVEF is  estimated at 55%, there is no significant mitral regurgitation  Estimated Blood Loss: minimal   Final Conclusions:   1. Diffusely calcified coronary arteries with only mild nonobstructive CAD 2. Normal left ventricular systolic function with normal LVEDP   Recommendations: medical management of nonobstructive CAD.  Recent Labs: 01/11/2021: BUN 19; Creatinine, Ser 1.11; Hemoglobin 15.4; Magnesium 2.2; Platelets 245; Potassium 3.9; Sodium 142  No results found for requested labs within last 365 days.   CrCl cannot be calculated (Patient's most recent lab result is older than the maximum 21 days allowed.).   Wt Readings from Last 3 Encounters:  05/22/21 204 lb (92.5 kg)  03/24/21 212 lb (96.2 kg)  01/11/21 210 lb (95.3 kg)     Other studies reviewed: Additional studies/records reviewed today include: summarized above  ASSESSMENT AND PLAN:  Paroxysmal Afib CHA2DS2Vasc is 4, on Eliquis,  appropriately dosed PVCs  On sotalol Both well controlled  QTc stable  Update labs today   3.   NICM Recovered LVEF by last echo  No symptoms or exam findings of volume OL  4. HTN Looks great on current regime   Disposition: will have him back again in 51mo, sooner if needed  Current medicines are reviewed at length with the patient today.  The patient did not have any concerns regarding medicines.  Norma Fredrickson, PA-C 07/09/2021 4:36 AM     [facility] [address] [place] [address] [phone] (office)  [phone] (fax)

## 2021-07-10 ENCOUNTER — Encounter: Payer: Self-pay | Admitting: Physician Assistant

## 2021-07-10 ENCOUNTER — Ambulatory Visit (INDEPENDENT_AMBULATORY_CARE_PROVIDER_SITE_OTHER): Payer: Medicare Other | Admitting: Physician Assistant

## 2021-07-10 VITALS — BP 118/68 | HR 52 | Ht 69.0 in | Wt 204.6 lb

## 2021-07-10 DIAGNOSIS — I428 Other cardiomyopathies: Secondary | ICD-10-CM

## 2021-07-10 DIAGNOSIS — I480 Paroxysmal atrial fibrillation: Secondary | ICD-10-CM | POA: Diagnosis not present

## 2021-07-10 DIAGNOSIS — I493 Ventricular premature depolarization: Secondary | ICD-10-CM | POA: Diagnosis not present

## 2021-07-10 DIAGNOSIS — I10 Essential (primary) hypertension: Secondary | ICD-10-CM

## 2021-07-10 NOTE — Patient Instructions (Signed)
Medication Instructions:  Your physician recommends that you continue on your current medications as directed. Please refer to the Current Medication list given to you today.  *If you need a refill on your cardiac medications before your next appointment, please call your pharmacy*   Lab Work: TODAY: BMET, Mag, CBC  If you have labs (blood work) drawn today and your tests are completely normal, you will receive your results only by: [place] (if you have MyChart) OR A paper copy in the mail If you have any lab test that is abnormal or we need to change your treatment, we will call you to review the results.   Follow-Up: At [facility], you and your health needs are our priority.  As part of our continuing mission to provide you with exceptional heart care, we have created designated Provider Care Teams.  These Care Teams include your primary Cardiologist (physician) and Advanced Practice Providers (APPs -  Physician Assistants and Nurse Practitioners) who all work together to provide you with the care you need, when you need it.   Your next appointment:   6 month(s)  The format for your next appointment:   In Person  Provider:   You may see Will [name], MD or one of the following Advanced Practice Providers on your designated Care Team:   Tommye Standard, [place]

## 2021-07-11 ENCOUNTER — Other Ambulatory Visit: Payer: Self-pay

## 2021-07-11 DIAGNOSIS — I428 Other cardiomyopathies: Secondary | ICD-10-CM

## 2021-07-11 LAB — BASIC METABOLIC PANEL
BUN/Creatinine Ratio: 14 (ref 10–24)
BUN: 19 mg/dL (ref 8–27)
CO2: 24 mmol/L (ref 20–29)
Calcium: 9.4 mg/dL (ref 8.6–10.2)
Chloride: 106 mmol/L (ref 96–106)
Creatinine, Ser: 1.37 mg/dL — ABNORMAL HIGH (ref 0.76–1.27)
Glucose: 106 mg/dL — ABNORMAL HIGH (ref 70–99)
Potassium: 3.8 mmol/L (ref 3.5–5.2)
Sodium: 140 mmol/L (ref 134–144)
eGFR: 54 mL/min/{1.73_m2} — ABNORMAL LOW (ref >59)

## 2021-07-11 LAB — CBC
Hematocrit: 43.2 % (ref 37.5–51.0)
Hemoglobin: 14.7 g/dL (ref 13.0–17.7)
MCH: 29.7 pg (ref 26.6–33.0)
MCHC: 34 g/dL (ref 31.5–35.7)
MCV: 87 fL (ref 79–97)
Platelets: 218 10*3/uL (ref 150–450)
RBC: 4.95 x10E6/uL (ref 4.14–5.80)
RDW: 13.5 % (ref 11.6–15.4)
WBC: 6 10*3/uL (ref 3.4–10.8)

## 2021-07-11 LAB — MAGNESIUM: Magnesium: 1.9 mg/dL (ref 1.6–2.3)

## 2021-07-20 DIAGNOSIS — M65352 Trigger finger, left little finger: Secondary | ICD-10-CM | POA: Diagnosis not present

## 2021-07-29 ENCOUNTER — Other Ambulatory Visit: Payer: Self-pay | Admitting: Cardiovascular Disease

## 2021-07-29 DIAGNOSIS — I10 Essential (primary) hypertension: Secondary | ICD-10-CM

## 2021-07-29 DIAGNOSIS — I2510 Atherosclerotic heart disease of native coronary artery without angina pectoris: Secondary | ICD-10-CM

## 2021-07-31 NOTE — Telephone Encounter (Signed)
Prescription refill request for Eliquis received. Indication: PAF Last office visit: 07/10/21  R Ursuy PA-C Scr: 1.37 on 07/10/21 Age:  76 Weight: 92.8kg  Based on above findings Eliquis '5mg'$  twice daily is the appropriate dose.  Refill approved.

## 2021-08-01 ENCOUNTER — Ambulatory Visit: Payer: Self-pay

## 2021-08-01 NOTE — Patient Outreach (Signed)
  Care Coordination   Outreach  Visit Note   08/01/2021 Name: TEDRIC LEETH MRN: [id number] DOB: [birth year]  ADVIT TRETHEWEY is a 76 y.o. year old male who sees Kelton Pillar, MD for primary care. I spoke with  [name] by phone today   Follow up plan:  Mr. Pickerill requested outreach later this month . Appointment scheduled for August 15, 2021.  Encounter Outcome:  Pt. Scheduled  [facility] [phone]

## 2021-08-08 DIAGNOSIS — M19012 Primary osteoarthritis, left shoulder: Secondary | ICD-10-CM | POA: Diagnosis not present

## 2021-08-15 ENCOUNTER — Other Ambulatory Visit: Payer: Medicare Other

## 2021-08-15 DIAGNOSIS — I428 Other cardiomyopathies: Secondary | ICD-10-CM

## 2021-08-15 LAB — BASIC METABOLIC PANEL
BUN/Creatinine Ratio: 16 (ref 10–24)
BUN: 22 mg/dL (ref 8–27)
CO2: 20 mmol/L (ref 20–29)
Calcium: 9.1 mg/dL (ref 8.6–10.2)
Chloride: 103 mmol/L (ref 96–106)
Creatinine, Ser: 1.35 mg/dL — ABNORMAL HIGH (ref 0.76–1.27)
Glucose: 95 mg/dL (ref 70–99)
Potassium: 3.8 mmol/L (ref 3.5–5.2)
Sodium: 138 mmol/L (ref 134–144)
eGFR: 55 mL/min/{1.73_m2} — ABNORMAL LOW (ref >59)

## 2021-08-17 ENCOUNTER — Other Ambulatory Visit: Payer: Self-pay | Admitting: *Deleted

## 2021-08-17 DIAGNOSIS — Z79899 Other long term (current) drug therapy: Secondary | ICD-10-CM

## 2021-08-17 MED ORDER — CHLORTHALIDONE 25 MG PO TABS: 12.5000 mg | ORAL_TABLET | Freq: Every day | ORAL | 3 refills | Status: DC

## 2021-08-22 DIAGNOSIS — K219 Gastro-esophageal reflux disease without esophagitis: Secondary | ICD-10-CM | POA: Diagnosis not present

## 2021-08-22 DIAGNOSIS — K602 Anal fissure, unspecified: Secondary | ICD-10-CM | POA: Diagnosis not present

## 2021-08-22 DIAGNOSIS — R197 Diarrhea, unspecified: Secondary | ICD-10-CM | POA: Diagnosis not present

## 2021-08-22 DIAGNOSIS — K625 Hemorrhage of anus and rectum: Secondary | ICD-10-CM | POA: Diagnosis not present

## 2021-08-22 DIAGNOSIS — K644 Residual hemorrhoidal skin tags: Secondary | ICD-10-CM | POA: Diagnosis not present

## 2021-08-22 DIAGNOSIS — R1310 Dysphagia, unspecified: Secondary | ICD-10-CM | POA: Diagnosis not present

## 2021-08-23 DIAGNOSIS — R197 Diarrhea, unspecified: Secondary | ICD-10-CM | POA: Diagnosis not present

## 2021-08-23 DIAGNOSIS — K625 Hemorrhage of anus and rectum: Secondary | ICD-10-CM | POA: Diagnosis not present

## 2021-08-29 ENCOUNTER — Ambulatory Visit: Payer: Self-pay

## 2021-08-29 NOTE — Patient Outreach (Signed)
  Care Coordination   Initial Visit Note   08/29/2021 Name: Logan Sanders MRN: [id number] DOB: 1945/12/17  Logan Sanders is a 76 y.o. year old male who sees Collene Leyden, MD for primary care. I spoke with  Logan Sanders by phone today.  What matters to the patients health and wellness today?  No Concerns Expressed    Goals Addressed             This Visit's Progress    Care Coordination Activities       Care Coordination Interventions: Reviewed plan for disease management.  Reviewed medications. Reports managing well. Denies concerns r/t medication management or prescription cost. Assessed social determinant of health barriers.        SDOH assessments and interventions completed:  Yes  SDOH Interventions Today    Flowsheet Row Most Recent Value  SDOH Interventions   Food Insecurity Interventions Intervention Not Indicated  Transportation Interventions Intervention Not Indicated        Care Coordination Interventions Activated:  Yes  Care Coordination Interventions:  Yes, provided   Follow up plan: No further intervention required.   Encounter Outcome:  Pt. Visit Completed     [place] [facility] [phone]

## 2021-08-29 NOTE — Patient Instructions (Signed)
Visit Information Thank you for taking time to speak with me today.   Following are the goals we discussed today:   Goals Addressed             This Visit's Progress    COMPLETED: Care Coordination Activities       Care Coordination Interventions: Reviewed plan for disease management.  Reviewed medications. Reports managing well. Denies concerns r/t medication management or prescription cost. Assessed social determinant of health barriers.        Logan Sanders verbalized understanding of  the information discussed during the telephonic outreach. Declined need for mailed resources or instructions.  No further follow up required.  [place] [facility] [phone]

## 2021-09-13 ENCOUNTER — Telehealth: Payer: Self-pay | Admitting: Cardiovascular Disease

## 2021-09-13 NOTE — Telephone Encounter (Signed)
Pt aware will forward to Dr Acie Fredrickson for review .Adonis Housekeeper

## 2021-09-13 NOTE — Telephone Encounter (Signed)
Patient calling to get his DOD physical note stating he is okay to work. He says he can pick it up at the office and that it is due next week.

## 2021-09-14 ENCOUNTER — Encounter: Payer: Self-pay | Admitting: Cardiovascular Disease

## 2021-09-14 NOTE — Telephone Encounter (Signed)
Nahser, Wonda Cheng, MD  Donnalee Curry K Caller: Unspecified (Yesterday,  8:04 AM) Please generate a letter  with the following information.  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  Sincerely,  Mertie Moores, MD  09/14/21  Thanks  PN     Letter composed and sent to patient via [place].

## 2021-09-19 DIAGNOSIS — R197 Diarrhea, unspecified: Secondary | ICD-10-CM | POA: Diagnosis not present

## 2021-09-19 DIAGNOSIS — K602 Anal fissure, unspecified: Secondary | ICD-10-CM | POA: Diagnosis not present

## 2021-09-19 DIAGNOSIS — R1310 Dysphagia, unspecified: Secondary | ICD-10-CM | POA: Diagnosis not present

## 2021-09-26 DIAGNOSIS — M65332 Trigger finger, left middle finger: Secondary | ICD-10-CM | POA: Diagnosis not present

## 2021-10-17 ENCOUNTER — Ambulatory Visit: Payer: Medicare Other | Attending: Physician Assistant

## 2021-10-17 DIAGNOSIS — Z79899 Other long term (current) drug therapy: Secondary | ICD-10-CM | POA: Diagnosis not present

## 2021-10-17 LAB — BASIC METABOLIC PANEL
BUN/Creatinine Ratio: 10 (ref 10–24)
BUN: 14 mg/dL (ref 8–27)
CO2: 24 mmol/L (ref 20–29)
Calcium: 9.4 mg/dL (ref 8.6–10.2)
Chloride: 101 mmol/L (ref 96–106)
Creatinine, Ser: 1.37 mg/dL — ABNORMAL HIGH (ref 0.76–1.27)
Glucose: 94 mg/dL (ref 70–99)
Potassium: 3.7 mmol/L (ref 3.5–5.2)
Sodium: 137 mmol/L (ref 134–144)
eGFR: 54 mL/min/{1.73_m2} — ABNORMAL LOW (ref >59)

## 2021-11-20 DIAGNOSIS — I129 Hypertensive chronic kidney disease with stage 1 through stage 4 chronic kidney disease, or unspecified chronic kidney disease: Secondary | ICD-10-CM | POA: Diagnosis not present

## 2021-11-20 DIAGNOSIS — E7800 Pure hypercholesterolemia, unspecified: Secondary | ICD-10-CM | POA: Diagnosis not present

## 2021-11-20 DIAGNOSIS — I2510 Atherosclerotic heart disease of native coronary artery without angina pectoris: Secondary | ICD-10-CM | POA: Diagnosis not present

## 2021-11-20 DIAGNOSIS — L309 Dermatitis, unspecified: Secondary | ICD-10-CM | POA: Diagnosis not present

## 2021-11-20 DIAGNOSIS — I480 Paroxysmal atrial fibrillation: Secondary | ICD-10-CM | POA: Diagnosis not present

## 2021-11-20 DIAGNOSIS — G479 Sleep disorder, unspecified: Secondary | ICD-10-CM | POA: Diagnosis not present

## 2021-11-20 DIAGNOSIS — R7303 Prediabetes: Secondary | ICD-10-CM | POA: Diagnosis not present

## 2021-11-20 DIAGNOSIS — E039 Hypothyroidism, unspecified: Secondary | ICD-10-CM | POA: Diagnosis not present

## 2021-11-20 DIAGNOSIS — K219 Gastro-esophageal reflux disease without esophagitis: Secondary | ICD-10-CM | POA: Diagnosis not present

## 2021-12-27 ENCOUNTER — Other Ambulatory Visit: Payer: Self-pay | Admitting: Cardiovascular Disease

## 2022-01-04 DIAGNOSIS — M65352 Trigger finger, left little finger: Secondary | ICD-10-CM | POA: Diagnosis not present

## 2022-01-04 DIAGNOSIS — M65332 Trigger finger, left middle finger: Secondary | ICD-10-CM | POA: Diagnosis not present

## 2022-01-11 DIAGNOSIS — M65352 Trigger finger, left little finger: Secondary | ICD-10-CM | POA: Diagnosis not present

## 2022-01-11 DIAGNOSIS — M65332 Trigger finger, left middle finger: Secondary | ICD-10-CM | POA: Diagnosis not present

## 2022-01-11 DIAGNOSIS — M65331 Trigger finger, right middle finger: Secondary | ICD-10-CM | POA: Diagnosis not present

## 2022-01-17 ENCOUNTER — Other Ambulatory Visit: Payer: Self-pay | Admitting: Cardiovascular Disease

## 2022-01-18 ENCOUNTER — Encounter: Payer: Self-pay | Admitting: Cardiovascular Disease

## 2022-01-18 NOTE — Progress Notes (Signed)
Logan Sanders Date of Birth  27-Jul-1945       [facility] [address], [place], [place]  [address]   [place], [place]  [address] [place]   Fax  [phone]     Fax [phone]  Problem List: 1. Hypertension 2. Hyperlipidemia 3. Hypothyroidism 4. CAD -   Moderate CAD by cath 11/09/2013 5. Paroxsymal atrial fib:     Logan Sanders is a 77 old gentleman with a history of hypertension, hyperlipidemia and a positive family history of cardiac disease. He was referred for possible stress testing given his risk factors.  He has not had any symptoms of CP or dyspnea.  He worked for the [employer] for 33 years - retired 2005.  Currently, he spends his time riding his [place] , plays golf, hunts deer, [place], squirrel, .  Walks about a mile each day.  Does all this without any CP or dyspnea.  Lifts light weights and uses resistance bands.    Grandfather died at age 66, father died at age 31. Uncle died in his 76's   11-08-2013:  Logan Sanders presents today for followup. We performed a coronary calcium score which revealed significant calcification: Dense calcification distal LM Scattered calcification of mid and distal LAD and mid and distal RCA  Subsequent stress myoview was abnormal Overall Impression:  Intermediate risk stress nuclear study with mild lateral wall ischemia and mildly depressed LV systolic function.  LV Ejection Fraction: 47%.  LV Wall Motion:  NL LV Function; NL Wall Motion  Jan. 13, 2016: Logan Sanders is a 77 yo with an abnormal coronary calcium score - significant calcification: Dense calcification distal LM Scattered calcification of mid and distal LAD and mid and distal RCA Myoview was abnormal and he was referred for cath.   LM  moderate calcification. The vessel has 20-30% stenosis in its midportion. The ostium of the left main is widely patent. The distal left main is  widely patent.    (LAD): the LAD is medium in caliber. The vessel reaches the LV apex. The proximal vessel has diffuse irregularity with mild calcification.  D1 has 30-40% stenosis at its ostium. The mid LAD at that bifurcation also has 30-40% stenosis. The remaining portions of the LAD have mild nonobstructive disease. Left circumflex (LCx): the left circumflex is patent. The mid vessel has 40% stenosis. There is a tiny obtuse marginal branch that has 50% stenosis. The major OM branch is widely patent with minor nonobstructive disease. Right coronary artery (RCA): this is a dominant vessel. There is diffuse calcification and irregularity but there are no high-grade stenoses present. There is no more than about 20 or 30% stenosis which is most significant at the junction of the mid and distal RCA. The PDA and PLA branches are patent. Left ventriculography: Left ventricular systolic function is normal, LVEF is estimated at 55%  Feeling well.  No CP . The cath showed mild - moderate irregularities. He has not had any further issues  July 19, 2014:   Logan Sanders is doing well.  No dizziness, no syncope  Has some ringing in his ears.  Takes his BP at home, readings are in the normal range.   Jan. 23, 2017: Logan Sanders is seen back for a follow up visit. Walks on occasion .  Not as much exercise as in the summer . No angina . No  dyspnea.  BP is well controlled.   Sept. 6, 2017:  Logan Sanders is seen back for follow up of newly diagnosed atrial fib.  The afib was diagnosed when he came for his echo - was seen incidentally on the echo. Has been started on Eliquis  No bleeding  Still exercising . Has been watching his salt .   March 01, 2016:    Seen back for his mild CAD and Paroxysmal atrial fib  Cannot tell when he is in Atrial fib. Has some indigestion.  Belches and feels better  Not exercising much.  Weather has been challenging  No CP or dyspnea doing his regular activities.   Sept. 27, 2018:    Logan Sanders is seen back for follow-up of his mild to moderate coronary artery disease and his paroxysmal atrial fibrillation. Has had some ankle swelling.   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  Is also on amlodipine ( which is contributing to the edema )   July 19, 2017: Logan Sanders is seen today for follow-up of his mild coronary artery disease, paroxysmal atrial fib .  He pulled a muscle in his back and is requesting some muscle relaxers.  December 04, 2017:  Logan Sanders is seen today for follow-up of his moderate coronary artery disease and paroxysmal atrial fibrillation.  He was recently seen at Dr. Dellis Filbert office.  He was found to have a blood pressure with a a moderately high systolic reading in a very low diastolic reading. Is having frequent PVCs  SPECT that his premature ventricular contractions contributed to his very low diastolic blood pressure reading.  He is fairly active.  He is limited by his hip pain.  Is scheduled to have back surgery on Dec. 18, 2019 hes not having any CP . He is at low risk for this surgery .   Also has had some hemorrhoid issues Needs to have them banded and needs to hold Eliquis for 3 days with that. I've given him the OK for that procedure as well.     July 18, 2018   Is under lots of stress,  duaghter was just dx'd with breast cancer   A myoview was ordered for his increased palpitatinos. And a PVC burden of 15.9%.    Saw [facility] for his large PVC burden.   Was stared on Mexilitine 150 mg PO BID .  At times he thinks that the mexiletine has helped with the palpitations but at other times the palpitations seem to be just as bad.   EF was noted to be llow - echo has been ordered to verifty his EF   Echocardiogram performed December, 2019 revealed normal left ventricular systolic function.  He had mild diastolic dysfunction.  Nov. 24, 2020  Logan Sanders is  seen today for follow-up of his premature ventricular contractions.  He  saw Dr. Curt Bears several weeks ago.   He did a PVC ablation but it did not work completely .     He was started on sotalol 40 mg a day  instead of mexiletine.  Dose was incrased to 80.  Coreg has been stopped Feeling well.   Has lost some weight  BP is a little elevated.   He says this is unusual .   Nov. 29, 2021: Logan Sanders is seen today for follow up visit. He has had a PVC ablation by Dr. Curt Bears in 2020. Was not completely successful Was started on Sotolol.   Here for pre-op evaluation  for  carpule tunnel surgery  myoview study in July , 2020 showed no ischemia  Echo reveals normal LV function .   Jan. 19, 2024  Logan Sanders is seen for follow up of his PVCs, PAF, moderate CAD, HTN, HLD  Is on eliquis 5 BID  He had some questions about Eliquis.  He wanted to explore the option of having a Watchman procedure.  Seems to be feeling well  Exercising some    Current Outpatient Medications  Medication Sig Dispense Refill   amLODipine-olmesartan (AZOR) 10-40 MG tablet Take 1 tablet by mouth daily. 90 tablet 3   arginine 500 MG tablet Take 500 mg by mouth daily.     atorvastatin (LIPITOR) 40 MG tablet Take 1 tablet (40 mg total) by mouth daily. 90 tablet 3   chlorthalidone (HYGROTON) 25 MG tablet Take 0.5 tablets (12.5 mg total) by mouth daily. 45 tablet 3   Cholecalciferol (D3-1000) 25 MCG (1000 UT) tablet Take 1,000 Units by mouth daily.     Co-Enzyme Q-10 100 MG CAPS Take 100 mg by mouth daily.      Cyanocobalamin (VITAMIN B-12 PO) Take 1 tablet by mouth daily.     diazepam (VALIUM) 5 MG tablet Take 5 mg by mouth as needed.     doxazosin (CARDURA) 4 MG tablet Take 1 tablet (4 mg total) by mouth 2 (two) times daily. 60 tablet 11   ELIQUIS 5 MG TABS tablet TAKE ONE TABLET BY MOUTH TWICE DAILY 60 tablet 5   fexofenadine (ALLEGRA) 180 MG tablet Take 180 mg by mouth daily as needed for allergies.      multivitamin-iron-minerals-folic acid (CENTRUM) chewable tablet Chew 1 tablet by mouth daily.      Omega-3 Fatty Acids (FISH OIL PO) Take 1,000 mg by mouth daily.     omeprazole (PRILOSEC) 40 MG capsule Take 40 mg by mouth daily as needed (for acid reflux).     OVER THE COUNTER MEDICATION Take 1 tablet by mouth daily. Super Beet supplement     potassium chloride (KLOR-CON) 10 MEQ tablet TAKE 1 TABLET EACH DAY. 90 tablet 3   sotalol (BETAPACE) 80 MG tablet TAKE 1 TABLET BY MOUTH TWICE DAILY. 60 tablet 0   SYNTHROID 100 MCG tablet Take 100 mcg by mouth daily before breakfast.      traZODone (DESYREL) 50 MG tablet Take 50 mg by mouth at bedtime.     zolpidem (AMBIEN) 10 MG tablet Take 10 mg by mouth at bedtime.     No current facility-administered medications for this visit.     No Known Allergies  Past Medical History:  Diagnosis Date   BPH (benign prostatic hyperplasia)    CAD in native artery    Complication of anesthesia    gets really anxious when waking up   ED (erectile dysfunction)    Hyperlipidemia    Hypertension    Hypothyroidism    Osteoarthritis    PVC's (premature ventricular contractions)    3-day cardiac monitor 07/2018: NSR, frequent PVCs (PVC burden 15.9%); rare PACs and rare episode of non-sustained SVT  //  Echo 08/2018: EF 55-60, mild LVH, normal RV SF, mild LAE, mild dilation of aortic root and ascending aorta (38 mm)    Thyroid disease     Past Surgical History:  Procedure Laterality Date   CARDIAC CATHETERIZATION  11/09/13   non obstructive disease in all vessels.   COLONOSCOPY     KNEE ARTHROSCOPY Left    x3   LEFT HEART  CATHETERIZATION WITH CORONARY ANGIOGRAM N/A 11/09/2013   Procedure: LEFT HEART CATHETERIZATION WITH CORONARY ANGIOGRAM;  Surgeon: Blane Ohara, MD;  Location: [facility] CATH LAB;  Service: Cardiovascular;  Laterality: N/A;   LOWER BACK SURGERY  1985   LUMBAR LAMINECTOMY/DECOMPRESSION MICRODISCECTOMY N/A 12/18/2017   Procedure: LAMINECTOMY AND FORAMINOTOMY LUMBAR TWO- LUMBAR THREE, LUMBAR THREE- LUMBAR FOUR, LUMBAR FOUR- LUMBAR FIVE;   Surgeon: Newman Pies, MD;  Location: [facility];  Service: Neurosurgery;  Laterality: N/A;   NECK SURGERY  1999   PVC ABLATION N/A 10/07/2018   Procedure: PVC ABLATION;  Surgeon: Constance Haw, MD;  Location: [facility] CV LAB;  Service: Cardiovascular;  Laterality: N/A;   REPLACEMENT TOTAL KNEE  2005   LEFT KNEE   WISDOM TOOTH EXTRACTION      Social History   Tobacco Use  Smoking Status Former   Packs/day: 0.50   Years: 5.00   Total pack years: 2.50   Types: Cigarettes   Quit date: 05/06/1999   Years since quitting: 22.7  Smokeless Tobacco Former   Quit date: 07/28/1995  Tobacco Comments   smoked on and off    Social History   Substance and Sexual Activity  Alcohol Use Yes   Alcohol/week: 1.0 standard drink of alcohol   Types: 1 Cans of beer per week   Comment: occasionally    Family History  Problem Relation Age of Onset   Asthma Mother    Parkinsonism Mother    Heart disease Father    Heart attack Father    Prostate cancer Father    Hyperlipidemia Brother     Reviw of Systems:  Reviewed in the HPI.  All other systems are negative.  Physical Exam: Blood pressure 126/60, pulse (!) 56, height '5\' 9"'$  (1.753 m), weight 206 lb 3.2 oz (93.5 kg), SpO2 97 %.     GEN:  Well nourished, well developed in no acute distress HEENT: Normal NECK: No JVD; No carotid bruits LYMPHATICS: No lymphadenopathy CARDIAC: RRR , no murmurs, rubs, gallops RESPIRATORY:  Clear to auscultation without rales, wheezing or rhonchi  ABDOMEN: Soft, non-tender, non-distended MUSCULOSKELETAL:  No edema; No deformity  SKIN: Warm and dry NEUROLOGIC:  Alert and oriented x 3    ECG:       Assessment / Plan:   1. Hypertension -   blood pressure is well-controlled.  2.  Frequent premature ventricular contractions: His PVCs seem to be well-controlled. . 3. CAD -    he is not having any episodes of chest pain or shortness of breath.  4.  Hyperlipidemia:   Lipid levels look great.    5.  PAF : He is doing well on Eliquis but thinks that he may want to come off it.  Is causing lots of bruising.  He would like to discuss Watchman procedure.  Will refer him to Dr. Burt Knack or Dr. Quentin Ore for discussion of Watchman procedure.    Mertie Moores, MD  01/19/2022 2:33 PM    [place] [place] [place],  [place], [place]  [address] Pager [phone] Phone: [phone]; Fax: [phone]

## 2022-01-19 ENCOUNTER — Ambulatory Visit
Payer: No Typology Code available for payment source | Attending: Cardiovascular Disease | Admitting: Cardiovascular Disease

## 2022-01-19 ENCOUNTER — Encounter: Payer: Self-pay | Admitting: Cardiovascular Disease

## 2022-01-19 VITALS — BP 126/60 | HR 56 | Ht 69.0 in | Wt 206.2 lb

## 2022-01-19 DIAGNOSIS — I480 Paroxysmal atrial fibrillation: Secondary | ICD-10-CM

## 2022-01-19 NOTE — Patient Instructions (Signed)
Medication Instructions:  Your physician recommends that you continue on your current medications as directed. Please refer to the Current Medication list given to you today.  *If you need a refill on your cardiac medications before your next appointment, please call your pharmacy*   Lab Work: NONE If you have labs (blood work) drawn today and your tests are completely normal, you will receive your results only by: [facility] (if you have MyChart) OR A paper copy in the mail If you have any lab test that is abnormal or we need to change your treatment, we will call you to review the results.   Testing/Procedures: Ambulatory referral to structural team to discuss Watchman procedure   Follow-Up: At [facility], you and your health needs are our priority.  As part of our continuing mission to provide you with exceptional heart care, we have created designated Provider Care Teams.  These Care Teams include your primary Cardiologist (physician) and Advanced Practice Providers (APPs -  Physician Assistants and Nurse Practitioners) who all work together to provide you with the care you need, when you need it.  We recommend signing up for the patient portal called "MyChart".  Sign up information is provided on this After Visit Summary.  MyChart is used to connect with patients for Virtual Visits (Telemedicine).  Patients are able to view lab/test results, encounter notes, upcoming appointments, etc.  Non-urgent messages can be sent to your provider as well.   To learn more about what you can do with MyChart, go to [email or web address].    Your next appointment:   1 year(s)  Provider:   Mertie Moores, MD

## 2022-01-24 ENCOUNTER — Other Ambulatory Visit: Payer: Self-pay | Admitting: Cardiovascular Disease

## 2022-02-05 ENCOUNTER — Other Ambulatory Visit: Payer: Self-pay | Admitting: Cardiovascular Disease

## 2022-02-05 DIAGNOSIS — I2510 Atherosclerotic heart disease of native coronary artery without angina pectoris: Secondary | ICD-10-CM

## 2022-02-05 DIAGNOSIS — I10 Essential (primary) hypertension: Secondary | ICD-10-CM

## 2022-02-05 NOTE — Telephone Encounter (Signed)
Prescription refill request for Eliquis received. Indication:afib Last office visit:1/24 Scr:1.3  10/23 Age: 77 Weight:93.5  kg  Prescription refilled

## 2022-02-06 ENCOUNTER — Institutional Professional Consult (permissible substitution): Payer: No Typology Code available for payment source | Admitting: Cardiovascular Disease

## 2022-02-19 ENCOUNTER — Other Ambulatory Visit: Payer: Self-pay

## 2022-02-19 ENCOUNTER — Other Ambulatory Visit: Payer: Self-pay | Admitting: Nurse Practitioner

## 2022-02-19 MED ORDER — DOXAZOSIN MESYLATE 4 MG PO TABS: 4.0000 mg | ORAL_TABLET | Freq: Two times a day (BID) | ORAL | 11 refills | Status: DC

## 2022-03-07 ENCOUNTER — Other Ambulatory Visit: Payer: Self-pay | Admitting: Cardiovascular Disease

## 2022-03-20 ENCOUNTER — Other Ambulatory Visit: Payer: Self-pay | Admitting: Cardiovascular Disease

## 2022-03-28 DIAGNOSIS — M25552 Pain in left hip: Secondary | ICD-10-CM | POA: Diagnosis not present

## 2022-04-19 ENCOUNTER — Other Ambulatory Visit: Payer: Self-pay | Admitting: Cardiovascular Disease

## 2022-05-08 DIAGNOSIS — M65331 Trigger finger, right middle finger: Secondary | ICD-10-CM | POA: Diagnosis not present

## 2022-05-21 DIAGNOSIS — Z0000 Encounter for general adult medical examination without abnormal findings: Secondary | ICD-10-CM | POA: Diagnosis not present

## 2022-05-21 DIAGNOSIS — N3941 Urge incontinence: Secondary | ICD-10-CM | POA: Diagnosis not present

## 2022-05-21 DIAGNOSIS — K219 Gastro-esophageal reflux disease without esophagitis: Secondary | ICD-10-CM | POA: Diagnosis not present

## 2022-05-21 DIAGNOSIS — N401 Enlarged prostate with lower urinary tract symptoms: Secondary | ICD-10-CM | POA: Diagnosis not present

## 2022-05-21 DIAGNOSIS — N1831 Chronic kidney disease, stage 3a: Secondary | ICD-10-CM | POA: Diagnosis not present

## 2022-05-21 DIAGNOSIS — I2510 Atherosclerotic heart disease of native coronary artery without angina pectoris: Secondary | ICD-10-CM | POA: Diagnosis not present

## 2022-05-21 DIAGNOSIS — N5201 Erectile dysfunction due to arterial insufficiency: Secondary | ICD-10-CM | POA: Diagnosis not present

## 2022-05-21 DIAGNOSIS — I480 Paroxysmal atrial fibrillation: Secondary | ICD-10-CM | POA: Diagnosis not present

## 2022-05-21 DIAGNOSIS — R7303 Prediabetes: Secondary | ICD-10-CM | POA: Diagnosis not present

## 2022-05-21 DIAGNOSIS — E039 Hypothyroidism, unspecified: Secondary | ICD-10-CM | POA: Diagnosis not present

## 2022-05-21 DIAGNOSIS — G479 Sleep disorder, unspecified: Secondary | ICD-10-CM | POA: Diagnosis not present

## 2022-05-21 DIAGNOSIS — E7800 Pure hypercholesterolemia, unspecified: Secondary | ICD-10-CM | POA: Diagnosis not present

## 2022-05-21 DIAGNOSIS — I129 Hypertensive chronic kidney disease with stage 1 through stage 4 chronic kidney disease, or unspecified chronic kidney disease: Secondary | ICD-10-CM | POA: Diagnosis not present

## 2022-06-01 DIAGNOSIS — Z136 Encounter for screening for cardiovascular disorders: Secondary | ICD-10-CM | POA: Diagnosis not present

## 2022-06-01 DIAGNOSIS — Z87891 Personal history of nicotine dependence: Secondary | ICD-10-CM | POA: Diagnosis not present

## 2022-06-13 ENCOUNTER — Ambulatory Visit: Payer: No Typology Code available for payment source | Admitting: Cardiology

## 2022-06-15 DIAGNOSIS — H2513 Age-related nuclear cataract, bilateral: Secondary | ICD-10-CM | POA: Diagnosis not present

## 2022-06-15 DIAGNOSIS — H43813 Vitreous degeneration, bilateral: Secondary | ICD-10-CM | POA: Diagnosis not present

## 2022-06-15 DIAGNOSIS — Z0100 Encounter for examination of eyes and vision without abnormal findings: Secondary | ICD-10-CM | POA: Diagnosis not present

## 2022-06-15 DIAGNOSIS — H18413 Arcus senilis, bilateral: Secondary | ICD-10-CM | POA: Diagnosis not present

## 2022-06-25 DIAGNOSIS — Z0100 Encounter for examination of eyes and vision without abnormal findings: Secondary | ICD-10-CM | POA: Diagnosis not present

## 2022-06-29 DIAGNOSIS — R159 Full incontinence of feces: Secondary | ICD-10-CM | POA: Diagnosis not present

## 2022-07-03 DIAGNOSIS — B079 Viral wart, unspecified: Secondary | ICD-10-CM | POA: Diagnosis not present

## 2022-07-03 DIAGNOSIS — L814 Other melanin hyperpigmentation: Secondary | ICD-10-CM | POA: Diagnosis not present

## 2022-07-03 DIAGNOSIS — D692 Other nonthrombocytopenic purpura: Secondary | ICD-10-CM | POA: Diagnosis not present

## 2022-07-03 DIAGNOSIS — D2261 Melanocytic nevi of right upper limb, including shoulder: Secondary | ICD-10-CM | POA: Diagnosis not present

## 2022-07-03 DIAGNOSIS — D225 Melanocytic nevi of trunk: Secondary | ICD-10-CM | POA: Diagnosis not present

## 2022-07-03 DIAGNOSIS — D485 Neoplasm of uncertain behavior of skin: Secondary | ICD-10-CM | POA: Diagnosis not present

## 2022-07-03 DIAGNOSIS — L821 Other seborrheic keratosis: Secondary | ICD-10-CM | POA: Diagnosis not present

## 2022-07-19 ENCOUNTER — Ambulatory Visit: Payer: No Typology Code available for payment source | Admitting: Student

## 2022-07-19 DIAGNOSIS — N3941 Urge incontinence: Secondary | ICD-10-CM | POA: Diagnosis not present

## 2022-07-19 DIAGNOSIS — N401 Enlarged prostate with lower urinary tract symptoms: Secondary | ICD-10-CM | POA: Diagnosis not present

## 2022-07-19 DIAGNOSIS — N5201 Erectile dysfunction due to arterial insufficiency: Secondary | ICD-10-CM | POA: Diagnosis not present

## 2022-07-20 ENCOUNTER — Ambulatory Visit: Payer: No Typology Code available for payment source | Admitting: Student

## 2022-08-13 ENCOUNTER — Other Ambulatory Visit: Payer: Self-pay | Admitting: Cardiovascular Disease

## 2022-08-13 DIAGNOSIS — I480 Paroxysmal atrial fibrillation: Secondary | ICD-10-CM

## 2022-08-13 DIAGNOSIS — I10 Essential (primary) hypertension: Secondary | ICD-10-CM

## 2022-08-13 DIAGNOSIS — I2510 Atherosclerotic heart disease of native coronary artery without angina pectoris: Secondary | ICD-10-CM

## 2022-08-13 NOTE — Telephone Encounter (Signed)
Prescription refill request for Eliquis received. Indication: Afib  Last office visit: 01/19/22 ([facility])  Scr:  1.37 (10/17/21)  Age: 77 Weight: 93.5kg  Appropriate dose. Refill sent.

## 2022-08-14 DIAGNOSIS — M65331 Trigger finger, right middle finger: Secondary | ICD-10-CM | POA: Diagnosis not present

## 2022-08-15 NOTE — Progress Notes (Unsigned)
Cardiology Office Note Date:  08/15/2022  Patient ID:  Logan Sanders, [place] October 03, 1945, MRN [id number] PCP:  Irven Coe, MD  Cardiologist:  Dr. Eden Emms Electrophysiologist: Dr. Elberta Fortis     Chief Complaint:  *** annual visit  History of Present Illness: Logan Sanders is a 77 y.o. male with history of AFib, CAD (non-obstructive by cath 2015), HTN, HLD, hypothyroidism, PVCs, NICM (felt 2/2 PVCs (?)).  Note that he had improvement in his EF prior to sotalol and partial ablation procedure.  He saw Dr. Elberta Fortis, last seen by him in Sept 2022, was doing well on sotalol with minimal if any symptoms of AFib or his PVCs.  Seeing myself and cards since then  I saw him 07/10/21 He continues to do well Still working and active, no formal exercise but busy with no exertional incapacities or changes. He will occasionally get a funny feeling in his chest, thinks it is his GERD/GI because when he burps it goes away. No CP otherwise No SOB No near syncope or syncope. No bleeding or signs of bleeding outside of some easy arm bruising No palpitations or cardiac awareness No changes were made  He saw Dr. Elease Hashimoto 01/19/22, doing well, exercising some, discussed his OAC, pt concerns of bruising, some discussion perhaps of exploring watchman with plans to refer to one of the structural MDs.   *** lambert/cooper? *** watchman candidate? *** sotalol EKG, labs *** burden, AF, PVCs *** eliquis, dose, labs, bleeding   AFib/PVC hx AFib diagnosed 2017 EPS for PVCs, incomplete ablation 2/2 to proximity to the RCA Sotalol started Nov 2020   Past Medical History:  Diagnosis Date   BPH (benign prostatic hyperplasia)    CAD in native artery    Complication of anesthesia    gets really anxious when waking up   ED (erectile dysfunction)    Hyperlipidemia    Hypertension    Hypothyroidism    Osteoarthritis    PVC's (premature ventricular contractions)    3-day cardiac monitor 07/2018: NSR,  frequent PVCs (PVC burden 15.9%); rare PACs and rare episode of non-sustained SVT  //  Echo 08/2018: EF 55-60, mild LVH, normal RV SF, mild LAE, mild dilation of aortic root and ascending aorta (38 mm)    Thyroid disease     Past Surgical History:  Procedure Laterality Date   CARDIAC CATHETERIZATION  11/09/13   non obstructive disease in all vessels.   COLONOSCOPY     KNEE ARTHROSCOPY Left    x3   LEFT HEART CATHETERIZATION WITH CORONARY ANGIOGRAM N/A 11/09/2013   Procedure: LEFT HEART CATHETERIZATION WITH CORONARY ANGIOGRAM;  Surgeon: Micheline Chapman, MD;  Location: [facility] CATH LAB;  Service: Cardiovascular;  Laterality: N/A;   LOWER BACK SURGERY  1985   LUMBAR LAMINECTOMY/DECOMPRESSION MICRODISCECTOMY N/A 12/18/2017   Procedure: LAMINECTOMY AND FORAMINOTOMY LUMBAR TWO- LUMBAR THREE, LUMBAR THREE- LUMBAR FOUR, LUMBAR FOUR- LUMBAR FIVE;  Surgeon: Tressie Stalker, MD;  Location: [facility] OR;  Service: Neurosurgery;  Laterality: N/A;   NECK SURGERY  1999   PVC ABLATION N/A 10/07/2018   Procedure: PVC ABLATION;  Surgeon: Regan Lemming, MD;  Location: [facility] INVASIVE CV LAB;  Service: Cardiovascular;  Laterality: N/A;   REPLACEMENT TOTAL KNEE  2005   LEFT KNEE   WISDOM TOOTH EXTRACTION      Current Outpatient Medications  Medication Sig Dispense Refill   amLODipine-olmesartan (AZOR) 10-40 MG tablet Take 1 tablet by mouth daily. 90 tablet 3   arginine 500 MG tablet Take 500  mg by mouth daily.     atorvastatin (LIPITOR) 40 MG tablet Take 1 tablet (40 mg total) by mouth daily. 90 tablet 3   chlorthalidone (HYGROTON) 25 MG tablet Take 1 tablet (25 mg total) by mouth daily. 90 tablet 0   Cholecalciferol (D3-1000) 25 MCG (1000 UT) tablet Take 1,000 Units by mouth daily.     Co-Enzyme Q-10 100 MG CAPS Take 100 mg by mouth daily.      Cyanocobalamin (VITAMIN B-12 PO) Take 1 tablet by mouth daily.     diazepam (VALIUM) 5 MG tablet Take 5 mg by mouth as needed.     doxazosin (CARDURA) 4 MG tablet Take 1  tablet (4 mg total) by mouth 2 (two) times daily. 60 tablet 11   doxazosin (CARDURA) 4 MG tablet Take 1 tablet (4 mg total) by mouth 2 (two) times daily. 60 tablet 11   ELIQUIS 5 MG TABS tablet TAKE ONE TABLET BY MOUTH TWICE DAILY 60 tablet 5   fexofenadine (ALLEGRA) 180 MG tablet Take 180 mg by mouth daily as needed for allergies.      multivitamin-iron-minerals-folic acid (CENTRUM) chewable tablet Chew 1 tablet by mouth daily.     Omega-3 Fatty Acids (FISH OIL PO) Take 1,000 mg by mouth daily.     omeprazole (PRILOSEC) 40 MG capsule Take 40 mg by mouth daily as needed (for acid reflux).     OVER THE COUNTER MEDICATION Take 1 tablet by mouth daily. Super Beet supplement     potassium chloride (KLOR-CON) 10 MEQ tablet TAKE 1 TABLET EACH DAY. 90 tablet 3   sotalol (BETAPACE) 80 MG tablet TAKE 1 TABLET BY MOUTH TWICE DAILY. 180 tablet 3   SYNTHROID 100 MCG tablet Take 100 mcg by mouth daily before breakfast.      traZODone (DESYREL) 50 MG tablet Take 50 mg by mouth at bedtime.     zolpidem (AMBIEN) 10 MG tablet Take 10 mg by mouth at bedtime.     No current facility-administered medications for this visit.    Allergies:   Patient has no known allergies.   Social History:  The patient  reports that he quit smoking about 23 years ago. His smoking use included cigarettes. He started smoking about 28 years ago. He has a 2.5 pack-year smoking history. He quit smokeless tobacco use about 27 years ago. He reports current alcohol use of about 1.0 standard drink of alcohol per week. He reports that he does not use drugs.   Family History:  The patient's family history includes Asthma in his mother; Heart attack in his father; Heart disease in his father; Hyperlipidemia in his brother; Parkinsonism in his mother; Prostate cancer in his father.  ROS:  Please see the history of present illness.    All other systems are reviewed and otherwise negative.   PHYSICAL EXAM:  VS:  There were no vitals taken  for this visit. BMI: There is no height or weight on file to calculate BMI. Well nourished, well developed, in no acute distress HEENT: normocephalic, atraumatic Neck: no JVD, carotid bruits or masses Cardiac: *** RRR; bradycardic, no significant murmurs, no rubs, or gallops Lungs: *** CTA b/l, no wheezing, rhonchi or rales Abd: soft, nontender MS: no deformity or atrophy Ext: *** no edema Skin: warm and dry, no rash Neuro:  No gross deficits appreciated Psych: euthymic mood, full affect   EKG:  Done today and reviewed by myself shows  ***   10/07/2018: EPS/ablation CONCLUSIONS:  1. Sinus  rhythm upon presentation.  2.  Ablation of PVCs in the right ventricular outflow tract and right coronary cusp 3.  Reduced PVC burden 4. No inducible arrhythmias following ablation.  5. No early apparent complications.   Echo 08/04/18 Left Ventricle: The left ventricle has normal systolic function, with an  ejection fraction of 55-60%. The cavity size was normal. There is mildly  increased left ventricular wall thickness. Left ventricular diastolic  parameters were normal.  Right Ventricle: The right ventricle has normal systolic function. The  cavity was normal.  Left Atrium: Left atrial size was mildly dilated.  Right Atrium: Right atrial size was normal in size. Right atrial pressure  is estimated at 10 mmHg.  Interatrial Septum: No atrial level shunt detected by color flow Doppler.  Pericardium: There is no evidence of pericardial effusion.  Mitral Valve: The mitral valve is abnormal. Mild thickening of the mitral  valve leaflet. Mitral valve regurgitation is not visualized by color flow  Doppler.  Tricuspid Valve: The tricuspid valve is grossly normal. Tricuspid valve  regurgitation is trivial by color flow Doppler.  Aortic Valve: The aortic valve is tricuspid Mild thickening of the aortic  valve. Aortic valve regurgitation was not visualized by color flow  Doppler. There is No  stenosis of the aortic valve.  Pulmonic Valve: The pulmonic valve was not well visualized. Pulmonic valve  regurgitation is not visualized by color flow Doppler.  Aorta: The aorta is abnormal in size and structure. There is mild  dilatation of the aortic root and of the ascending aorta measuring 38 mm.  Venous: The inferior vena cava measures 1.10 cm, is normal in size with  greater than 50% respiratory variability.    LHC 11/15 Left mainstem: the left mainstem has moderate calcification. The vessel has 20-30% stenosis in its midportion. The ostium of the left main is widely patent. The distal left main is widely patent. The stenosis in the left mainstem appears very mild in severity. The vessel divides into the LAD and left circumflex. Left anterior descending (LAD): the LAD is medium in caliber. The vessel reaches the LV apex. The proximal vessel has diffuse irregularity with mild calcification. The first diagonal has 30-40% stenosis at its ostium. The mid LAD at that bifurcation also has 30-40% stenosis. The remaining portions of the LAD have mild nonobstructive disease. Left circumflex (LCx): the left circumflex is patent. The mid vessel has 40% stenosis. There is a tiny obtuse marginal branch that has 50% stenosis. The major OM branch is widely patent with minor nonobstructive disease. Right coronary artery (RCA): this is a dominant vessel. There is diffuse calcification and irregularity but there are no high-grade stenoses present. There is no more than about 20 or 30% stenosis which is most significant at the junction of the mid and distal RCA. The PDA and PLA branches are patent. Left ventriculography: Left ventricular systolic function is normal, LVEF is estimated at 55%, there is no significant mitral regurgitation  Estimated Blood Loss: minimal   Final Conclusions:   1. Diffusely calcified coronary arteries with only mild nonobstructive CAD 2. Normal left ventricular systolic function  with normal LVEDP   Recommendations: medical management of nonobstructive CAD.  Recent Labs: 10/17/2021: BUN 14; Creatinine, Ser 1.37; Potassium 3.7; Sodium 137  No results found for requested labs within last 365 days.   CrCl cannot be calculated (Patient's most recent lab result is older than the maximum 21 days allowed.).   Wt Readings from Last 3 Encounters:  01/19/22  206 lb 3.2 oz (93.5 kg)  07/10/21 204 lb 9.6 oz (92.8 kg)  05/22/21 204 lb (92.5 kg)     Other studies reviewed: Additional studies/records reviewed today include: summarized above  ASSESSMENT AND PLAN:  Paroxysmal Afib CHA2DS2Vasc is 4, on Eliquis, *** appropriately dosed PVCs *** On sotalol *** Both well controlled *** QTc stable *** Update labs today   3.   NICM *** Recovered LVEF by last echo *** No symptoms or exam findings of volume OL  4. HTN *** Looks great on current regime   Disposition: ***  Current medicines are reviewed at length with the patient today.  The patient did not have any concerns regarding medicines.  Norma Fredrickson, PA-C 08/15/2022 7:39 AM     [facility] [address] [place] [address] [phone] (office)  [phone] (fax)

## 2022-08-16 ENCOUNTER — Ambulatory Visit: Payer: No Typology Code available for payment source | Attending: Cardiology | Admitting: Physician Assistant

## 2022-08-16 ENCOUNTER — Encounter: Payer: Self-pay | Admitting: Physician Assistant

## 2022-08-16 VITALS — BP 118/82 | HR 57 | Ht 69.0 in | Wt 213.6 lb

## 2022-08-16 DIAGNOSIS — I480 Paroxysmal atrial fibrillation: Secondary | ICD-10-CM

## 2022-08-16 DIAGNOSIS — I428 Other cardiomyopathies: Secondary | ICD-10-CM | POA: Diagnosis not present

## 2022-08-16 DIAGNOSIS — D6869 Other thrombophilia: Secondary | ICD-10-CM

## 2022-08-16 DIAGNOSIS — Z79899 Other long term (current) drug therapy: Secondary | ICD-10-CM

## 2022-08-16 DIAGNOSIS — Z5181 Encounter for therapeutic drug level monitoring: Secondary | ICD-10-CM

## 2022-08-16 DIAGNOSIS — I493 Ventricular premature depolarization: Secondary | ICD-10-CM

## 2022-08-16 LAB — BASIC METABOLIC PANEL
BUN/Creatinine Ratio: 17 (ref 10–24)
BUN: 20 mg/dL (ref 8–27)
CO2: 23 mmol/L (ref 20–29)
Calcium: 9.3 mg/dL (ref 8.6–10.2)
Chloride: 105 mmol/L (ref 96–106)
Creatinine, Ser: 1.15 mg/dL (ref 0.76–1.27)
Glucose: 99 mg/dL (ref 70–99)
Potassium: 3.9 mmol/L (ref 3.5–5.2)
Sodium: 140 mmol/L (ref 134–144)
eGFR: 66 mL/min/{1.73_m2} (ref >59)

## 2022-08-16 LAB — MAGNESIUM: Magnesium: 2.1 mg/dL (ref 1.6–2.3)

## 2022-08-16 NOTE — Patient Instructions (Signed)
Medication Instructions:   Your physician recommends that you continue on your current medications as directed. Please refer to the Current Medication list given to you today.   *If you need a refill on your cardiac medications before your next appointment, please call your pharmacy*   Lab Work: BMET AND MAG TODAY    If you have labs (blood work) drawn today and your tests are completely normal, you will receive your results only by: MyChart Message (if you have MyChart) OR A paper copy in the mail If you have any lab test that is abnormal or we need to change your treatment, we will call you to review the results.   Testing/Procedures: NONE ORDERED  TODAY      Follow-Up: At [facility], you and your health needs are our priority.  As part of our continuing mission to provide you with exceptional heart care, we have created designated Provider Care Teams.  These Care Teams include your primary Cardiologist (physician) and Advanced Practice Providers (APPs -  Physician Assistants and Nurse Practitioners) who all work together to provide you with the care you need, when you need it.  We recommend signing up for the patient portal called "MyChart".  Sign up information is provided on this After Visit Summary.  MyChart is used to connect with patients for Virtual Visits (Telemedicine).  Patients are able to view lab/test results, encounter notes, upcoming appointments, etc.  Non-urgent messages can be sent to your provider as well.   To learn more about what you can do with MyChart, go to [email or web address].    Your next appointment:   1 year(s)  Provider:   You may see Will Jorja Loa, MD or one of the following Advanced Practice Providers on your designated Care Team:   Francis Dowse, [place]   Other Instructions

## 2022-08-28 ENCOUNTER — Telehealth: Payer: Self-pay | Admitting: *Deleted

## 2022-08-28 DIAGNOSIS — R152 Fecal urgency: Secondary | ICD-10-CM | POA: Diagnosis not present

## 2022-08-28 DIAGNOSIS — K219 Gastro-esophageal reflux disease without esophagitis: Secondary | ICD-10-CM | POA: Diagnosis not present

## 2022-08-28 DIAGNOSIS — Z86010 Personal history of colonic polyps: Secondary | ICD-10-CM | POA: Diagnosis not present

## 2022-08-28 DIAGNOSIS — Z7901 Long term (current) use of anticoagulants: Secondary | ICD-10-CM | POA: Diagnosis not present

## 2022-08-28 NOTE — Telephone Encounter (Signed)
   Pre-operative Risk Assessment    Patient Name: Logan Sanders  DOB: 1945-11-25 MRN: [id number]    DATE OF LAST VISIT: 08/16/22 RENEE URSUY, PAC DATE OF NEXT VISIT: NONE  Request for Surgical Clearance    Procedure:   FLEXSIG/ENDOSCOPY ; [facility] AND GERD  Date of Surgery:  Clearance 09/05/22                                 Surgeon:  DR. Dulce Sellar Surgeon's Group or Practice Name:  [facility] Phone number:  [phone] Fax number:  [phone]   Type of Clearance Requested:   - Medical  - Pharmacy:  Hold Apixaban (Eliquis)     Type of Anesthesia:   PROPOFOL   Additional requests/questions:    Elpidio Anis   08/28/2022, 4:36 PM

## 2022-08-29 NOTE — Telephone Encounter (Signed)
Please advise holding Eliquis prior to flexsig/endoscopy on 09/05/2022.  Thank you!  DW

## 2022-08-29 NOTE — Telephone Encounter (Signed)
   Name: Logan Sanders  DOB: 09-07-1945  MRN: [id number]   Primary Cardiologist: Kristeen Miss, MD  Chart reviewed as part of pre-operative protocol coverage. KERRINGTON OESTERLE was last seen on 08/16/2022 by Francis Dowse, NP-C.  Per Renee, "No cardiac complaints, good exertional capacity. Stable EKG on Sotalol.  No cardiac contraindications to planned FLEXSIG/ENDOSCOPY  Avoid holding sotalol for more then a single dose.  Anticoagulation recommendations as per pharmacy team."  Therefore, based on ACC/AHA guidelines, the patient would be at acceptable risk for the planned procedure without further cardiovascular testing.   Per Pharm D, patient may hold Eliquis for 1-2 days prior to procedure.    I will route this recommendation to the requesting party via Epic fax function and remove from pre-op pool. Please call with questions.  Carlos Levering, NP 08/29/2022, 12:48 PM

## 2022-08-29 NOTE — Telephone Encounter (Signed)
Renee,   You saw this patient on 08/16/2022. Will you please comment on medical clearance for flexsig/endoscopy on 09/05/2022?  Please route your response to P CV DIV Preop. I will communicate with requesting office once you have given recommendations.   Thank you!  Carlos Levering, NP

## 2022-08-29 NOTE — Telephone Encounter (Addendum)
Patient with diagnosis of afib on Eliquis for anticoagulation.    Procedure: flex sig/endoscopy Date of procedure: 09/05/22  CHA2DS2-VASc Score = 5  This indicates a 7.2% annual risk of stroke. The patient's score is based upon: CHF History: 1 HTN History: 1 Diabetes History: 0 Stroke History: 0 Vascular Disease History: 1 Age Score: 2 Gender Score: 0  CrCl 10mL/min using adjusted body weight Platelet count 218K  Per office protocol, patient can hold Eliquis for 1-2 days prior to procedure.    **This guidance is not considered finalized until pre-operative APP has relayed final recommendations.**

## 2022-09-05 DIAGNOSIS — R194 Change in bowel habit: Secondary | ICD-10-CM | POA: Diagnosis not present

## 2022-09-05 DIAGNOSIS — K269 Duodenal ulcer, unspecified as acute or chronic, without hemorrhage or perforation: Secondary | ICD-10-CM | POA: Diagnosis not present

## 2022-09-05 DIAGNOSIS — R152 Fecal urgency: Secondary | ICD-10-CM | POA: Diagnosis not present

## 2022-09-05 DIAGNOSIS — K219 Gastro-esophageal reflux disease without esophagitis: Secondary | ICD-10-CM | POA: Diagnosis not present

## 2022-09-05 DIAGNOSIS — K2980 Duodenitis without bleeding: Secondary | ICD-10-CM | POA: Diagnosis not present

## 2022-09-05 DIAGNOSIS — R159 Full incontinence of feces: Secondary | ICD-10-CM | POA: Diagnosis not present

## 2022-09-05 DIAGNOSIS — K648 Other hemorrhoids: Secondary | ICD-10-CM | POA: Diagnosis not present

## 2022-09-07 DIAGNOSIS — K2980 Duodenitis without bleeding: Secondary | ICD-10-CM | POA: Diagnosis not present

## 2022-09-11 DIAGNOSIS — Z23 Encounter for immunization: Secondary | ICD-10-CM | POA: Diagnosis not present

## 2022-09-11 DIAGNOSIS — I10 Essential (primary) hypertension: Secondary | ICD-10-CM | POA: Diagnosis not present

## 2022-09-24 ENCOUNTER — Other Ambulatory Visit: Payer: Self-pay | Admitting: Cardiology

## 2022-10-02 ENCOUNTER — Ambulatory Visit: Payer: Self-pay | Admitting: Surgery

## 2022-10-02 ENCOUNTER — Telehealth: Payer: Self-pay | Admitting: *Deleted

## 2022-10-02 DIAGNOSIS — K642 Third degree hemorrhoids: Secondary | ICD-10-CM | POA: Diagnosis not present

## 2022-10-02 DIAGNOSIS — Z7901 Long term (current) use of anticoagulants: Secondary | ICD-10-CM

## 2022-10-02 DIAGNOSIS — Z01818 Encounter for other preprocedural examination: Secondary | ICD-10-CM

## 2022-10-02 DIAGNOSIS — I480 Paroxysmal atrial fibrillation: Secondary | ICD-10-CM | POA: Diagnosis not present

## 2022-10-02 DIAGNOSIS — I428 Other cardiomyopathies: Secondary | ICD-10-CM | POA: Diagnosis not present

## 2022-10-02 DIAGNOSIS — Z860101 Personal history of adenomatous and serrated colon polyps: Secondary | ICD-10-CM | POA: Diagnosis not present

## 2022-10-02 NOTE — Telephone Encounter (Signed)
Pre-operative Risk Assessment    Patient Name: Logan Sanders  DOB: 09/09/1945 MRN: [id number]      Request for Surgical Clearance    Procedure:   Hemorrhoidectomy  Date of Surgery:  Clearance TBD                                 Surgeon:  Dr. Karie Soda Surgeon's Group or Practice Name:  [facility] Phone number:  [phone] Fax number:  [phone]   Type of Clearance Requested:   - Medical  - Pharmacy:  Hold Apixaban (Eliquis) Not Indicated   Type of Anesthesia:  General    Additional requests/questions:    Signed, Josealberto Klett   10/02/2022, 12:44 PM

## 2022-10-02 NOTE — Telephone Encounter (Signed)
Gaston Islam., NP  You9 minutes ago (3:04 PM)    No, he can go ahead and get his labs so that his guidance can be completed.

## 2022-10-02 NOTE — Telephone Encounter (Signed)
Patient agreeable and voiced understanding. Labs ordered and appt scheduled.

## 2022-10-02 NOTE — Addendum Note (Signed)
Addended by: Alveta Heimlich on: 10/02/2022 02:38 PM   Modules accepted: Orders

## 2022-10-02 NOTE — Telephone Encounter (Signed)
Callback team please order updated CBC for patient in order to receive guidance for holding Eliquis.   Thanks

## 2022-10-02 NOTE — Telephone Encounter (Signed)
Spoke with the patient who states his procedure is not going to be done until December and wants to make our office aware. He wants to know if he should wait until closer to procedure to do labs?

## 2022-10-04 ENCOUNTER — Ambulatory Visit: Payer: No Typology Code available for payment source | Attending: Cardiology

## 2022-10-04 DIAGNOSIS — Z01818 Encounter for other preprocedural examination: Secondary | ICD-10-CM | POA: Diagnosis not present

## 2022-10-04 DIAGNOSIS — Z7901 Long term (current) use of anticoagulants: Secondary | ICD-10-CM | POA: Diagnosis not present

## 2022-10-04 DIAGNOSIS — I480 Paroxysmal atrial fibrillation: Secondary | ICD-10-CM | POA: Diagnosis not present

## 2022-10-05 LAB — CBC
Hematocrit: 43.3 % (ref 37.5–51.0)
Hemoglobin: 14.8 g/dL (ref 13.0–17.7)
MCH: 30.5 pg (ref 26.6–33.0)
MCHC: 34.2 g/dL (ref 31.5–35.7)
MCV: 89 fL (ref 79–97)
Platelets: 225 10*3/uL (ref 150–450)
RBC: 4.85 x10E6/uL (ref 4.14–5.80)
RDW: 12.9 % (ref 11.6–15.4)
WBC: 5.8 10*3/uL (ref 3.4–10.8)

## 2022-10-18 ENCOUNTER — Telehealth: Payer: Self-pay | Admitting: Cardiovascular Disease

## 2022-10-18 NOTE — Telephone Encounter (Signed)
Name: Logan Sanders  DOB: Nov 04, 1945  MRN: [id number]  Primary Cardiologist: Kristeen Miss, MD  Chart reviewed as part of pre-operative protocol coverage. Because of CHEVALIER KARNITZ past medical history and time since last visit, he will require a follow-up telephone visit in order to better assess preoperative cardiovascular risk.  Pre-op covering staff: - Please schedule appointment and call patient to inform them. If patient already had an upcoming appointment within acceptable timeframe, please add "pre-op clearance" to the appointment notes so provider is aware. - Please contact requesting surgeon's office via preferred method (i.e, phone, fax) to inform them of need for appointment prior to surgery.  Per office protocol, patient can hold Eliquis for 1-2 days prior to procedure.   Sharlene Dory, PA-C  10/18/2022, 3:17 PM

## 2022-10-18 NOTE — Telephone Encounter (Signed)
Will document clearance rec in original request.

## 2022-10-18 NOTE — Telephone Encounter (Signed)
I will send this message to pre op APP to review. I do not see that the pt has been cleared yet.

## 2022-10-18 NOTE — Telephone Encounter (Signed)
Follow Up:        Logan Sanders is calling to check on the status of patient's clearance. Please send this asap.

## 2022-10-18 NOTE — Telephone Encounter (Addendum)
Patient with diagnosis of afib on Eliquis for anticoagulation.    Procedure: hemorrhoidectomy Date of procedure: TBD  CHA2DS2-VASc Score = 5  This indicates a 7.2% annual risk of stroke. The patient's score is based upon: CHF History: 1 HTN History: 1 Diabetes History: 0 Stroke History: 0 Vascular Disease History: 1 Age Score: 2 Gender Score: 0   CrCl 36mL/min using adjusted body weight Platelet count 225K  Per office protocol, patient can hold Eliquis for 1-2 days prior to procedure.    **This guidance is not considered finalized until pre-operative APP has relayed final recommendations.**

## 2022-10-19 ENCOUNTER — Telehealth: Payer: Self-pay

## 2022-10-19 NOTE — Telephone Encounter (Signed)
Spoke with patient who is agreeable to do a tele visit on 11/1 at 10:20 am. Med rec and consent done.

## 2022-10-19 NOTE — Telephone Encounter (Signed)
  Patient Consent for Virtual Visit        Logan Sanders has provided verbal consent on 10/19/2022 for a virtual visit (video or telephone).   CONSENT FOR VIRTUAL VISIT FOR:  Logan Sanders  By participating in this virtual visit I agree to the following:  I hereby voluntarily request, consent and authorize [facility] and its employed or contracted physicians, physician assistants, nurse practitioners or other licensed health care professionals (the Practitioner), to provide me with telemedicine health care services (the "Services") as deemed necessary by the treating Practitioner. I acknowledge and consent to receive the Services by the Practitioner via telemedicine. I understand that the telemedicine visit will involve communicating with the Practitioner through live audiovisual communication technology and the disclosure of certain medical information by electronic transmission. I acknowledge that I have been given the opportunity to request an in-person assessment or other available alternative prior to the telemedicine visit and am voluntarily participating in the telemedicine visit.  I understand that I have the right to withhold or withdraw my consent to the use of telemedicine in the course of my care at any time, without affecting my right to future care or treatment, and that the Practitioner or I may terminate the telemedicine visit at any time. I understand that I have the right to inspect all information obtained and/or recorded in the course of the telemedicine visit and may receive copies of available information for a reasonable fee.  I understand that some of the potential risks of receiving the Services via telemedicine include:  Delay or interruption in medical evaluation due to technological equipment failure or disruption; Information transmitted may not be sufficient (e.g. poor resolution of images) to allow for appropriate medical decision making by the  Practitioner; and/or  In rare instances, security protocols could fail, causing a breach of personal health information.  Furthermore, I acknowledge that it is my responsibility to provide information about my medical history, conditions and care that is complete and accurate to the best of my ability. I acknowledge that Practitioner's advice, recommendations, and/or decision may be based on factors not within their control, such as incomplete or inaccurate data provided by me or distortions of diagnostic images or specimens that may result from electronic transmissions. I understand that the practice of medicine is not an exact science and that Practitioner makes no warranties or guarantees regarding treatment outcomes. I acknowledge that a copy of this consent can be made available to me via my patient portal [facility] MyChart), or I can request a printed copy by calling the office of [facility].    I understand that my insurance will be billed for this visit.   I have read or had this consent read to me. I understand the contents of this consent, which adequately explains the benefits and risks of the Services being provided via telemedicine.  I have been provided ample opportunity to ask questions regarding this consent and the Services and have had my questions answered to my satisfaction. I give my informed consent for the services to be provided through the use of telemedicine in my medical care

## 2022-10-24 DIAGNOSIS — M25552 Pain in left hip: Secondary | ICD-10-CM | POA: Diagnosis not present

## 2022-11-02 ENCOUNTER — Ambulatory Visit: Payer: No Typology Code available for payment source | Attending: Cardiovascular Disease | Admitting: Student

## 2022-11-02 DIAGNOSIS — Z01810 Encounter for preprocedural cardiovascular examination: Secondary | ICD-10-CM | POA: Diagnosis not present

## 2022-11-02 NOTE — Progress Notes (Signed)
Virtual Visit via Telephone Note   Because of SENECA HOBACK co-morbid illnesses, he is at least at moderate risk for complications without adequate follow up.  This format is felt to be most appropriate for this patient at this time.  The patient did not have access to video technology/had technical difficulties with video requiring transitioning to audio format only (telephone).  All issues noted in this document were discussed and addressed.  No physical exam could be performed with this format.  Please refer to the patient's chart for his consent to telehealth for [facility].  Evaluation Performed:  Preoperative cardiovascular risk assessment _____________   Date:  11/02/2022   Patient ID:  Logan Sanders, DOB 09-22-1945, MRN [id number] Patient Location:  Home Provider location:   Office  Primary Care Provider:  Yates Decamp, MD Primary Cardiologist:  Kristeen Miss, MD  Chief Complaint / Patient Profile   77 y.o. y/o male with a h/o nonobstructive CAD per angiography 2015, chronic HFrEF with recovered LV function, PAF on anticoagulation, PVCs s/p PVC ablation October 2020, hypertension, hyperlipidemia who is pending hemorrhoidectomy by Dr. Michaell Cowing and presents today for telephonic preoperative cardiovascular risk assessment.  History of Present Illness    Logan Sanders is a 77 y.o. male who presents via audio/video conferencing for a telehealth visit today.  Pt was last seen in cardiology clinic on 08/16/2022 by Francis Dowse, PA-C.  At that time Logan Sanders was stable from a cardiac standpoint.  The patient is now pending procedure as outlined above. Since his last visit, he is doing well. Patient denies shortness of breath, dyspnea on exertion, lower extremity edema, orthopnea or PND. No chest pain, pressure, or tightness. Occasional flutters that he relates to reflux. No sustained palpitations. He is active walking 1+ miles a couple of times a week and performing chores  around his home.   Past Medical History    Past Medical History:  Diagnosis Date   BPH (benign prostatic hyperplasia)    CAD in native artery    Complication of anesthesia    gets really anxious when waking up   ED (erectile dysfunction)    Hyperlipidemia    Hypertension    Hypothyroidism    Osteoarthritis    PVC's (premature ventricular contractions)    3-day cardiac monitor 07/2018: NSR, frequent PVCs (PVC burden 15.9%); rare PACs and rare episode of non-sustained SVT  //  Echo 08/2018: EF 55-60, mild LVH, normal RV SF, mild LAE, mild dilation of aortic root and ascending aorta (38 mm)    Thyroid disease    Past Surgical History:  Procedure Laterality Date   CARDIAC CATHETERIZATION  11/09/13   non obstructive disease in all vessels.   COLONOSCOPY     KNEE ARTHROSCOPY Left    x3   LEFT HEART CATHETERIZATION WITH CORONARY ANGIOGRAM N/A 11/09/2013   Procedure: LEFT HEART CATHETERIZATION WITH CORONARY ANGIOGRAM;  Surgeon: Micheline Chapman, MD;  Location: [facility] CATH LAB;  Service: Cardiovascular;  Laterality: N/A;   LOWER BACK SURGERY  1985   LUMBAR LAMINECTOMY/DECOMPRESSION MICRODISCECTOMY N/A 12/18/2017   Procedure: LAMINECTOMY AND FORAMINOTOMY LUMBAR TWO- LUMBAR THREE, LUMBAR THREE- LUMBAR FOUR, LUMBAR FOUR- LUMBAR FIVE;  Surgeon: Tressie Stalker, MD;  Location: [facility] OR;  Service: Neurosurgery;  Laterality: N/A;   NECK SURGERY  1999   PVC ABLATION N/A 10/07/2018   Procedure: PVC ABLATION;  Surgeon: Regan Lemming, MD;  Location: [facility] INVASIVE CV LAB;  Service: Cardiovascular;  Laterality: N/A;  REPLACEMENT TOTAL KNEE  2005   LEFT KNEE   WISDOM TOOTH EXTRACTION      Allergies  No Known Allergies  Home Medications    Prior to Admission medications   Medication Sig Start Date End Date Taking? Authorizing Provider  amLODipine-olmesartan (AZOR) 10-40 MG tablet Take 1 tablet by mouth daily. 03/07/22   Nahser, Deloris Ping, MD  arginine 500 MG tablet Take 500 mg by mouth daily.     [provider]  atorvastatin (LIPITOR) 40 MG tablet Take 1 tablet (40 mg total) by mouth daily. 01/17/22   Nahser, Deloris Ping, MD  chlorthalidone (HYGROTON) 25 MG tablet Take 1 tablet (25 mg total) by mouth daily. 09/25/22   Sheilah Pigeon, PA-C  Cholecalciferol (D3-1000) 25 MCG (1000 UT) tablet Take 1,000 Units by mouth daily.    [provider]  Co-Enzyme Q-10 100 MG CAPS Take 100 mg by mouth daily.     [provider]  Cyanocobalamin (VITAMIN B-12 PO) Take 1 tablet by mouth daily.    [provider]  diazepam (VALIUM) 5 MG tablet Take 5 mg by mouth as needed. 11/11/19   [provider]  doxazosin (CARDURA) 4 MG tablet Take 1 tablet (4 mg total) by mouth 2 (two) times daily. 02/19/22   Nahser, Deloris Ping, MD  ELIQUIS 5 MG TABS tablet TAKE ONE TABLET BY MOUTH TWICE DAILY 08/13/22   Nahser, Deloris Ping, MD  fexofenadine (ALLEGRA) 180 MG tablet Take 180 mg by mouth daily as needed for allergies.     [provider]  multivitamin-iron-minerals-folic acid (CENTRUM) chewable tablet Chew 1 tablet by mouth daily.    [provider]  Omega-3 Fatty Acids (FISH OIL PO) Take 1,000 mg by mouth daily.    [provider]  omeprazole (PRILOSEC) 40 MG capsule Take 40 mg by mouth daily as needed (for acid reflux).    [provider]  OVER THE COUNTER MEDICATION Take 1 tablet by mouth daily. Super Beet supplement    [provider]  potassium chloride (KLOR-CON) 10 MEQ tablet TAKE 1 TABLET EACH DAY. 03/20/22   Nahser, Deloris Ping, MD  sotalol (BETAPACE) 80 MG tablet TAKE 1 TABLET BY MOUTH TWICE DAILY. 01/24/22   Nahser, Deloris Ping, MD  SYNTHROID 100 MCG tablet Take 100 mcg by mouth daily before breakfast.  09/28/13   [provider]  traZODone (DESYREL) 50 MG tablet Take 50 mg by mouth at bedtime.    [provider]  zolpidem (AMBIEN) 10 MG tablet Take 10 mg by mouth at bedtime. 09/10/13   [provider]     Physical Exam    Vital Signs:  Logan Sanders does not have vital signs available for review today.  Given telephonic nature of communication, physical exam is limited. AAOx3. NAD. Normal affect.  Speech and respirations are unlabored.  Accessory Clinical Findings    None  Assessment & Plan    Primary Cardiologist: Kristeen Miss, MD  Preoperative cardiovascular risk assessment.  Hemorrhoidectomy by Dr. Michaell Cowing.  Chart reviewed as part of pre-operative protocol coverage. According to the RCRI, patient has a 0.9% risk of MACE. Patient reports activity equivalent to 4.0 METS (walks 1+ miles 2 days a week, performs light to moderate activities around his home).   Given past medical history and time since last visit, based on ACC/AHA guidelines, NAREK KNISS would be at acceptable risk for the planned procedure without further cardiovascular testing.   Patient was advised that if  he develops new symptoms prior to surgery to contact our office to arrange a follow-up appointment.  he verbalized understanding.  Per Pharm D, patient may hold Eliquis for 1-2 days prior to procedure.    I will route this recommendation to the requesting party via Epic fax function.  Please call with questions.  Time:   Today, I have spent 6 minutes with the patient with telehealth technology discussing medical history, symptoms, and management plan.     Carlos Levering, NP  11/02/2022, 7:57 AM

## 2022-11-21 DIAGNOSIS — J309 Allergic rhinitis, unspecified: Secondary | ICD-10-CM | POA: Diagnosis not present

## 2022-11-21 DIAGNOSIS — G479 Sleep disorder, unspecified: Secondary | ICD-10-CM | POA: Diagnosis not present

## 2022-11-21 DIAGNOSIS — M62838 Other muscle spasm: Secondary | ICD-10-CM | POA: Diagnosis not present

## 2022-11-21 DIAGNOSIS — K219 Gastro-esophageal reflux disease without esophagitis: Secondary | ICD-10-CM | POA: Diagnosis not present

## 2022-11-21 DIAGNOSIS — D6869 Other thrombophilia: Secondary | ICD-10-CM | POA: Diagnosis not present

## 2022-11-21 DIAGNOSIS — I480 Paroxysmal atrial fibrillation: Secondary | ICD-10-CM | POA: Diagnosis not present

## 2022-11-21 DIAGNOSIS — N1831 Chronic kidney disease, stage 3a: Secondary | ICD-10-CM | POA: Diagnosis not present

## 2022-11-21 DIAGNOSIS — I2510 Atherosclerotic heart disease of native coronary artery without angina pectoris: Secondary | ICD-10-CM | POA: Diagnosis not present

## 2022-11-21 DIAGNOSIS — E7800 Pure hypercholesterolemia, unspecified: Secondary | ICD-10-CM | POA: Diagnosis not present

## 2022-11-21 DIAGNOSIS — R7303 Prediabetes: Secondary | ICD-10-CM | POA: Diagnosis not present

## 2022-11-21 DIAGNOSIS — I129 Hypertensive chronic kidney disease with stage 1 through stage 4 chronic kidney disease, or unspecified chronic kidney disease: Secondary | ICD-10-CM | POA: Diagnosis not present

## 2022-11-21 DIAGNOSIS — E039 Hypothyroidism, unspecified: Secondary | ICD-10-CM | POA: Diagnosis not present

## 2023-01-02 ENCOUNTER — Other Ambulatory Visit: Payer: Self-pay | Admitting: Cardiovascular Disease

## 2023-01-08 DIAGNOSIS — M65331 Trigger finger, right middle finger: Secondary | ICD-10-CM | POA: Diagnosis not present

## 2023-01-25 DIAGNOSIS — M65331 Trigger finger, right middle finger: Secondary | ICD-10-CM | POA: Diagnosis not present

## 2023-01-25 DIAGNOSIS — Z4789 Encounter for other orthopedic aftercare: Secondary | ICD-10-CM | POA: Diagnosis not present

## 2023-02-05 ENCOUNTER — Other Ambulatory Visit: Payer: Self-pay | Admitting: Cardiovascular Disease

## 2023-02-14 ENCOUNTER — Other Ambulatory Visit: Payer: Self-pay | Admitting: Cardiovascular Disease

## 2023-02-14 DIAGNOSIS — I480 Paroxysmal atrial fibrillation: Secondary | ICD-10-CM

## 2023-02-14 NOTE — Telephone Encounter (Signed)
Pt last saw Francis Dowse, [place] on 08/16/22, last labs 08/16/22 Creat 1.15, age 78, weight 96.9kg, based on specified criteria pt is on appropriate dosage of Eliquis 5mg  BID for afib.  Will refill rx.

## 2023-02-22 DIAGNOSIS — Z860100 Personal history of colon polyps, unspecified: Secondary | ICD-10-CM | POA: Diagnosis not present

## 2023-02-22 DIAGNOSIS — K649 Unspecified hemorrhoids: Secondary | ICD-10-CM | POA: Diagnosis not present

## 2023-03-01 ENCOUNTER — Other Ambulatory Visit: Payer: Self-pay | Admitting: Cardiovascular Disease

## 2023-03-12 ENCOUNTER — Encounter: Payer: Self-pay | Admitting: Cardiovascular Disease

## 2023-03-12 NOTE — Progress Notes (Unsigned)
 Francetta Found Date of Birth  [birth year]       [facility] Office [address]  [address], [address] [place], [place]  [address]   [place], [place]  [address] [phone]     [phone]   Fax  [phone]     Fax [phone]  Problem List: 1. Hypertension 2. Hyperlipidemia 3. Hypothyroidism 4. CAD -   Moderate CAD by cath 11/09/2013 5. Paroxsymal atrial fib:     Rad is a 74 old gentleman with a history of hypertension, hyperlipidemia and a positive family history of cardiac disease. He was referred for possible stress testing given his risk factors.  He has not had any symptoms of CP or dyspnea.  He worked for the [employer] for 33 years - retired 2005.  Currently, he spends his time riding his [place] Rud , plays golf, hunts deer, [place], squirrel, .  Walks about a mile each day.  Does all this without any CP or dyspnea.  Lifts light weights and uses resistance bands.    Grandfather died at age 50, father died at age 66. Uncle died in his 65's   November 12, 2013:  Jacquis presents today for followup. We performed a coronary calcium score which revealed significant calcification: Dense calcification distal LM Scattered calcification of mid and distal LAD and mid and distal RCA  Subsequent stress myoview was abnormal Overall Impression:  Intermediate risk stress nuclear study with mild lateral wall ischemia and mildly depressed LV systolic function.  LV Ejection Fraction: 47%.  LV Wall Motion:  NL LV Function; NL Wall Motion  Jan. 13, 2016: Brogan is a 78 yo with an abnormal coronary calcium score - significant calcification: Dense calcification distal LM Scattered calcification of mid and distal LAD and mid and distal RCA Myoview was abnormal and he was referred for cath.   LM  moderate calcification. The vessel has 20-30% stenosis in its midportion. The ostium of the left main is widely patent. The distal left main is  widely patent.    (LAD): the LAD is medium in caliber. The vessel reaches the LV apex. The proximal vessel has diffuse irregularity with mild calcification.  D1 has 30-40% stenosis at its ostium. The mid LAD at that bifurcation also has 30-40% stenosis. The remaining portions of the LAD have mild nonobstructive disease. Left circumflex (LCx): the left circumflex is patent. The mid vessel has 40% stenosis. There is a tiny obtuse marginal branch that has 50% stenosis. The major OM branch is widely patent with minor nonobstructive disease. Right coronary artery (RCA): this is a dominant vessel. There is diffuse calcification and irregularity but there are no high-grade stenoses present. There is no more than about 20 or 30% stenosis which is most significant at the junction of the mid and distal RCA. The PDA and PLA branches are patent. Left ventriculography: Left ventricular systolic function is normal, LVEF is estimated at 55%  Feeling well.  No CP . The cath showed mild - moderate irregularities. He has not had any further issues  July 19, 2014:   Brok is doing well.  No dizziness, no syncope  Has some ringing in his ears.  Takes his BP at home, readings are in the normal range.   Jan. 23, 2017: Ruhan is seen back for a follow up visit. Walks on occasion .  Not as much exercise as in the summer . No angina . No  dyspnea.  BP is well controlled.   Sept. 6, 2017:  Shayne is seen back for follow up of newly diagnosed atrial fib.  The afib was diagnosed when he came for his echo - was seen incidentally on the echo. Has been started on Eliquis  No bleeding  Still exercising . Has been watching his salt .   March 01, 2016:    Seen back for his mild CAD and Paroxysmal atrial fib  Cannot tell when he is in Atrial fib. Has some indigestion.  Belches and feels better  Not exercising much.  Weather has been challenging  No CP or dyspnea doing his regular activities.   Sept. 27, 2018:    Favio is seen back for follow-up of his mild to moderate coronary artery disease and his paroxysmal atrial fibrillation. Has had some ankle swelling.   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  Is also on amlodipine ( which is contributing to the edema )   July 19, 2017: Nickalas is seen today for follow-up of his mild coronary artery disease, paroxysmal atrial fib .  He pulled a muscle in his back and is requesting some muscle relaxers.  December 04, 2017:  Travontae is seen today for follow-up of his moderate coronary artery disease and paroxysmal atrial fibrillation.  He was recently seen at Dr. Donivan Scull office.  He was found to have a blood pressure with a a moderately high systolic reading in a very low diastolic reading. Is having frequent PVCs  SPECT that his premature ventricular contractions contributed to his very low diastolic blood pressure reading.  He is fairly active.  He is limited by his hip pain.  Is scheduled to have back surgery on Dec. 18, 2019 hes not having any CP . He is at low risk for this surgery .   Also has had some hemorrhoid issues Needs to have them banded and needs to hold Eliquis for 3 days with that. I've given him the OK for that procedure as well.     July 18, 2018   Is under lots of stress,  duaghter was just dx'd with breast cancer   A myoview was ordered for his increased palpitatinos. And a PVC burden of 15.9%.    Saw Camnitz for his large PVC burden.   Was stared on Mexilitine 150 mg PO BID .  At times he thinks that the mexiletine has helped with the palpitations but at other times the palpitations seem to be just as bad.   EF was noted to be llow - echo has been ordered to verifty his EF   Echocardiogram performed December, 2019 revealed normal left ventricular systolic function.  He had mild diastolic dysfunction.  Nov. 24, 2020  Shafiq is  seen today for follow-up of his premature ventricular contractions.  He  saw Dr. Elberta Fortis several weeks ago.   He did a PVC ablation but it did not work completely .     He was started on sotalol 40 mg a day  instead of mexiletine.  Dose was incrased to 80.  Coreg has been stopped Feeling well.   Has lost some weight  BP is a little elevated.   He says this is unusual .   Nov. 29, 2021: Finlee is seen today for follow up visit. He has had a PVC ablation by Dr. Elberta Fortis in 2020. Was not completely successful Was started on Sotolol.   Here for pre-op evaluation  for  carpule tunnel surgery  myoview study in July , 2020 showed no ischemia  Echo reveals normal LV function .   Jan. 19, 2024  Verlie is seen for follow up of his PVCs, PAF, moderate CAD, HTN, HLD  Is on eliquis 5 BID  He had some questions about Eliquis.  He wanted to explore the option of having a Watchman procedure.  Seems to be feeling well  Exercising some    March 13, 2023 Dyron is seen today for follow up of his PVCs ( s/p ablation), PAF  The ablation did not work very well.  Is now on Sotaolol ( sees [employer])   Walks regularly  Is on Eliquis for his PAF   Has mild nonobstructive CAD  Will continue atorvastatin 40 mg  Reminded him to watch his fatty foods.    Current Outpatient Medications  Medication Sig Dispense Refill   amLODipine-olmesartan (AZOR) 10-40 MG tablet Take 1 tablet by mouth daily. 90 tablet 3   arginine 500 MG tablet Take 500 mg by mouth daily.     atorvastatin (LIPITOR) 40 MG tablet Take 1 tablet (40 mg total) by mouth daily. 90 tablet 2   chlorthalidone (HYGROTON) 25 MG tablet Take 1 tablet (25 mg total) by mouth daily. 90 tablet 3   Cholecalciferol (D3-1000) 25 MCG (1000 UT) tablet Take 1,000 Units by mouth daily.     Co-Enzyme Q-10 100 MG CAPS Take 100 mg by mouth daily.      Cyanocobalamin (VITAMIN B-12 PO) Take 1 tablet by mouth daily.     diazepam (VALIUM) 5 MG tablet Take 5 mg by mouth as needed.     doxazosin (CARDURA) 4 MG tablet TAKE ONE TABLET BY  MOUTH TWICE DAILY 60 tablet 0   ELIQUIS 5 MG TABS tablet TAKE ONE TABLET BY MOUTH TWICE DAILY 60 tablet 5   fexofenadine (ALLEGRA) 180 MG tablet Take 180 mg by mouth daily as needed for allergies.      multivitamin-iron-minerals-folic acid (CENTRUM) chewable tablet Chew 1 tablet by mouth daily.     Omega-3 Fatty Acids (FISH OIL PO) Take 1,000 mg by mouth daily.     omeprazole (PRILOSEC) 40 MG capsule Take 40 mg by mouth daily as needed (for acid reflux).     OVER THE COUNTER MEDICATION Take 1 tablet by mouth daily. Super Beet supplement     potassium chloride (KLOR-CON) 10 MEQ tablet TAKE 1 TABLET EACH DAY. 90 tablet 3   sotalol (BETAPACE) 80 MG tablet TAKE ONE TABLET BY MOUTH TWICE DAILY 180 tablet 2   SYNTHROID 100 MCG tablet Take 100 mcg by mouth daily before breakfast.      traZODone (DESYREL) 50 MG tablet Take 50 mg by mouth at bedtime.     zolpidem (AMBIEN) 10 MG tablet Take 10 mg by mouth at bedtime.     No current facility-administered medications for this visit.     No Known Allergies  Past Medical History:  Diagnosis Date   BPH (benign prostatic hyperplasia)    CAD in native artery    Complication of anesthesia    gets really anxious when waking up   ED (erectile dysfunction)    Hyperlipidemia    Hypertension    Hypothyroidism    Osteoarthritis    PVC's (premature ventricular contractions)    3-day cardiac monitor 07/2018: NSR, frequent PVCs (PVC burden 15.9%); rare PACs and rare episode of non-sustained SVT  //  Echo 08/2018: EF 55-60, mild LVH, normal  RV SF, mild LAE, mild dilation of aortic root and ascending aorta (38 mm)    Thyroid disease     Past Surgical History:  Procedure Laterality Date   CARDIAC CATHETERIZATION  11/09/13   non obstructive disease in all vessels.   COLONOSCOPY     KNEE ARTHROSCOPY Left    x3   LEFT HEART CATHETERIZATION WITH CORONARY ANGIOGRAM N/A 11/09/2013   Procedure: LEFT HEART CATHETERIZATION WITH CORONARY ANGIOGRAM;  Surgeon: Micheline Chapman, MD;  Location[facility] CATH LAB;  Service: Cardiovascular;  Laterality: N/A;   LOWER BACK SURGERY  1985   LUMBAR LAMINECTOMY/DECOMPRESSION MICRODISCECTOMY N/A 12/18/2017   Procedure: LAMINECTOMY AND FORAMINOTOMY LUMBAR TWO- LUMBAR THREE, LUMBAR THREE- LUMBAR FOUR, LUMBAR FOUR- LUMBAR FIVE;  Surgeon: Tressie Stalker, MD;  Location: [facility] OR;  Service: Neurosurgery;  Laterality: N/A;   NECK SURGERY  1999   PVC ABLATION N/A 10/07/2018   Procedure: PVC ABLATION;  Surgeon: Regan Lemming, MD;  Location: [facility] INVASIVE CV LAB;  Service: Cardiovascular;  Laterality: N/A;   REPLACEMENT TOTAL KNEE  2005   LEFT KNEE   WISDOM TOOTH EXTRACTION      Social History   Tobacco Use  Smoking Status Former   Current packs/day: 0.00   Average packs/day: 0.5 packs/day for 5.0 years (2.5 ttl pk-yrs)   Types: Cigarettes   Start date: 05/06/1994   Quit date: 05/06/1999   Years since quitting: 23.8  Smokeless Tobacco Former   Quit date: 07/28/1995  Tobacco Comments   smoked on and off    Social History   Substance and Sexual Activity  Alcohol Use Yes   Alcohol/week: 1.0 standard drink of alcohol   Types: 1 Cans of beer per week   Comment: occasionally    Family History  Problem Relation Age of Onset   Asthma Mother    Parkinsonism Mother    Heart disease Father    Heart attack Father    Prostate cancer Father    Hyperlipidemia Brother     Reviw of Systems:  Reviewed in the HPI.  All other systems are negative.   Physical Exam: Blood pressure 134/68, pulse 65, height 5\' 9"  (1.753 m), weight 211 lb 6.4 oz (95.9 kg), SpO2 96%.     GEN:  Well nourished, well developed in no acute distress HEENT: Normal NECK: No JVD; No carotid bruits LYMPHATICS: No lymphadenopathy CARDIAC: RRR , no murmurs, rubs, gallops RESPIRATORY:  Clear to auscultation without rales, wheezing or rhonchi  ABDOMEN: Soft, non-tender, non-distended MUSCULOSKELETAL:  No edema; No deformity  SKIN: Warm and  dry NEUROLOGIC:  Alert and oriented x 3    ECG:       Assessment / Plan:   1. Hypertension -    BP is well controlled.   Cont current meds.   2.  Frequent premature ventricular contractions:  much better on sotalol   . 3. CAD -    mild, nonobstructive CAD .  Cont atorvastatin 40    4.  Hyperlipidemia:  cont atorvastatin     5.  PAF :   no recent episodes , cont eliquis      Kristeen Miss, MD  03/13/2023 3:36 PM    [facility] [address] [place],  [address] [place], [place]  [address] Pager [phone] Phone: [phone]; Fax: [phone]

## 2023-03-13 ENCOUNTER — Ambulatory Visit
Payer: No Typology Code available for payment source | Attending: Cardiovascular Disease | Admitting: Cardiovascular Disease

## 2023-03-13 ENCOUNTER — Encounter: Payer: Self-pay | Admitting: Cardiovascular Disease

## 2023-03-13 VITALS — BP 134/68 | HR 65 | Ht 69.0 in | Wt 211.4 lb

## 2023-03-13 DIAGNOSIS — I2510 Atherosclerotic heart disease of native coronary artery without angina pectoris: Secondary | ICD-10-CM | POA: Diagnosis not present

## 2023-03-13 DIAGNOSIS — I493 Ventricular premature depolarization: Secondary | ICD-10-CM | POA: Diagnosis not present

## 2023-03-13 DIAGNOSIS — I480 Paroxysmal atrial fibrillation: Secondary | ICD-10-CM | POA: Diagnosis not present

## 2023-03-13 NOTE — Patient Instructions (Signed)

## 2023-03-14 ENCOUNTER — Other Ambulatory Visit: Payer: Self-pay | Admitting: Cardiovascular Disease

## 2023-04-01 ENCOUNTER — Other Ambulatory Visit: Payer: Self-pay | Admitting: Cardiovascular Disease

## 2023-05-10 DIAGNOSIS — Z008 Encounter for other general examination: Secondary | ICD-10-CM | POA: Diagnosis not present

## 2023-05-10 DIAGNOSIS — I4891 Unspecified atrial fibrillation: Secondary | ICD-10-CM | POA: Diagnosis not present

## 2023-05-10 DIAGNOSIS — N182 Chronic kidney disease, stage 2 (mild): Secondary | ICD-10-CM | POA: Diagnosis not present

## 2023-05-10 DIAGNOSIS — E039 Hypothyroidism, unspecified: Secondary | ICD-10-CM | POA: Diagnosis not present

## 2023-05-10 DIAGNOSIS — E669 Obesity, unspecified: Secondary | ICD-10-CM | POA: Diagnosis not present

## 2023-05-10 DIAGNOSIS — E785 Hyperlipidemia, unspecified: Secondary | ICD-10-CM | POA: Diagnosis not present

## 2023-05-10 DIAGNOSIS — K219 Gastro-esophageal reflux disease without esophagitis: Secondary | ICD-10-CM | POA: Diagnosis not present

## 2023-05-10 DIAGNOSIS — I129 Hypertensive chronic kidney disease with stage 1 through stage 4 chronic kidney disease, or unspecified chronic kidney disease: Secondary | ICD-10-CM | POA: Diagnosis not present

## 2023-05-10 DIAGNOSIS — Z6830 Body mass index (BMI) 30.0-30.9, adult: Secondary | ICD-10-CM | POA: Diagnosis not present

## 2023-05-10 DIAGNOSIS — Z860100 Personal history of colon polyps, unspecified: Secondary | ICD-10-CM | POA: Diagnosis not present

## 2023-05-10 DIAGNOSIS — F17211 Nicotine dependence, cigarettes, in remission: Secondary | ICD-10-CM | POA: Diagnosis not present

## 2023-05-22 DIAGNOSIS — L309 Dermatitis, unspecified: Secondary | ICD-10-CM | POA: Diagnosis not present

## 2023-05-28 DIAGNOSIS — E7800 Pure hypercholesterolemia, unspecified: Secondary | ICD-10-CM | POA: Diagnosis not present

## 2023-05-28 DIAGNOSIS — E039 Hypothyroidism, unspecified: Secondary | ICD-10-CM | POA: Diagnosis not present

## 2023-05-28 DIAGNOSIS — R7303 Prediabetes: Secondary | ICD-10-CM | POA: Diagnosis not present

## 2023-05-28 DIAGNOSIS — I480 Paroxysmal atrial fibrillation: Secondary | ICD-10-CM | POA: Diagnosis not present

## 2023-05-30 DIAGNOSIS — Z0000 Encounter for general adult medical examination without abnormal findings: Secondary | ICD-10-CM | POA: Diagnosis not present

## 2023-05-30 DIAGNOSIS — E039 Hypothyroidism, unspecified: Secondary | ICD-10-CM | POA: Diagnosis not present

## 2023-05-30 DIAGNOSIS — G479 Sleep disorder, unspecified: Secondary | ICD-10-CM | POA: Diagnosis not present

## 2023-05-30 DIAGNOSIS — K219 Gastro-esophageal reflux disease without esophagitis: Secondary | ICD-10-CM | POA: Diagnosis not present

## 2023-05-30 DIAGNOSIS — M62838 Other muscle spasm: Secondary | ICD-10-CM | POA: Diagnosis not present

## 2023-05-30 DIAGNOSIS — E7800 Pure hypercholesterolemia, unspecified: Secondary | ICD-10-CM | POA: Diagnosis not present

## 2023-05-30 DIAGNOSIS — I480 Paroxysmal atrial fibrillation: Secondary | ICD-10-CM | POA: Diagnosis not present

## 2023-05-30 DIAGNOSIS — J309 Allergic rhinitis, unspecified: Secondary | ICD-10-CM | POA: Diagnosis not present

## 2023-05-30 DIAGNOSIS — N401 Enlarged prostate with lower urinary tract symptoms: Secondary | ICD-10-CM | POA: Diagnosis not present

## 2023-05-30 DIAGNOSIS — I129 Hypertensive chronic kidney disease with stage 1 through stage 4 chronic kidney disease, or unspecified chronic kidney disease: Secondary | ICD-10-CM | POA: Diagnosis not present

## 2023-05-30 DIAGNOSIS — R7303 Prediabetes: Secondary | ICD-10-CM | POA: Diagnosis not present

## 2023-06-19 DIAGNOSIS — H04123 Dry eye syndrome of bilateral lacrimal glands: Secondary | ICD-10-CM | POA: Diagnosis not present

## 2023-08-05 ENCOUNTER — Encounter: Payer: Self-pay | Admitting: Cardiology

## 2023-08-07 ENCOUNTER — Telehealth: Payer: Self-pay | Admitting: Cardiology

## 2023-08-07 NOTE — Telephone Encounter (Signed)
 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  08/07/2023 @ 2:14pm

## 2023-08-08 NOTE — Telephone Encounter (Signed)
 See telephone note

## 2023-08-08 NOTE — Telephone Encounter (Signed)
 Left message to call back  (Pt will need to be seen in order for clearance.  Will try and work him in to 8/19 or 8/21 when pt calls back)

## 2023-08-15 DIAGNOSIS — M7542 Impingement syndrome of left shoulder: Secondary | ICD-10-CM | POA: Diagnosis not present

## 2023-08-15 DIAGNOSIS — M19012 Primary osteoarthritis, left shoulder: Secondary | ICD-10-CM | POA: Diagnosis not present

## 2023-08-19 NOTE — Progress Notes (Unsigned)
  Electrophysiology Office Note:   Date:  08/20/2023  ID:  Logan Sanders, DOB 1945/01/16, MRN [id number]  Primary Cardiologist: Aleene Passe, MD (Inactive) Primary Heart Failure: None Electrophysiologist: Andreanna Mikolajczak Gladis Norton, MD      History of Present Illness:   Logan Sanders is a 78 y.o. male with h/o atrial fibrillation, nonobstructive coronary artery disease, hypertension, hyperlipidemia, hypothyroidism, PVCs, nonischemic cardiomyopathy thought due to PVCs seen today for routine electrophysiology followup.   Since last being seen in our clinic the patient reports doing overall well.  He has no chest pain and no shortness of breath.  He is able to all of his daily activities without restriction.  He has no acute complaints.  he denies chest pain, palpitations, dyspnea, PND, orthopnea, nausea, vomiting, dizziness, syncope, edema, weight gain, or early satiety.   Review of systems complete and found to be negative unless listed in HPI.   EP Information / Studies Reviewed:    EKG is ordered today. Personal review as below.  EKG Interpretation Date/Time:  Tuesday August 20 2023 10:52:28 EDT Ventricular Rate:  52 PR Interval:  150 QRS Duration:  84 QT Interval:  454 QTC Calculation: 422 R Axis:   -47  Text Interpretation: Sinus bradycardia with Premature atrial complexes Left axis deviation Inferior infarct (cited on or before 20-Aug-2023) When compared with ECG of 16-Aug-2022 11:30, Premature atrial complexes are now Present Confirmed by Gwendola Hornaday ([id number]) on 08/20/2023 11:09:30 AM   Risk Assessment/Calculations:    CHA2DS2-VASc Score = 5   This indicates a 7.2% annual risk of stroke. The patient's score is based upon: CHF History: 1 HTN History: 1 Diabetes History: 0 Stroke History: 0 Vascular Disease History: 1 Age Score: 2 Gender Score: 0            Physical Exam:   VS:  BP (!) 171/73 (BP Location: Right Arm, Patient Position: Sitting, Cuff Size: Large)   Pulse  (!) 52   Ht 5' 9 (1.753 m)   Wt 207 lb (93.9 kg)   SpO2 99%   BMI 30.57 kg/m    Wt Readings from Last 3 Encounters:  08/20/23 207 lb (93.9 kg)  03/13/23 211 lb 6.4 oz (95.9 kg)  08/16/22 213 lb 9.6 oz (96.9 kg)     GEN: Well nourished, well developed in no acute distress NECK: No JVD; No carotid bruits CARDIAC: Regular rate and rhythm, no murmurs, rubs, gallops RESPIRATORY:  Clear to auscultation without rales, wheezing or rhonchi  ABDOMEN: Soft, non-tender, non-distended EXTREMITIES:  No edema; No deformity   ASSESSMENT AND PLAN:    1.  Paroxysmal atrial fibrillation: Sotalol .  QTc remains stable.  No further episodes of atrial fibrillation.  2.  Secondary hypercoagulable state: On Eliquis   3.  PVCs: On sotalol .  Minimal burden.  4.  High-risk medication monitoring: On sotalol .  QTc remained stable.  Averianna Brugger check a BMP and magnesium today.  5.  Nonischemic cardiomyopathy: Recovered on last echo.  Plan per primary cardiology.  6.  Hypertension: Elevated today.  Usually well-controlled.  No changes.  Follow up with EP APP in 6 months  Signed, Zorion Nims Gladis Norton, MD

## 2023-08-20 ENCOUNTER — Ambulatory Visit: Attending: Cardiology | Admitting: Cardiology

## 2023-08-20 ENCOUNTER — Encounter: Payer: Self-pay | Admitting: *Deleted

## 2023-08-20 ENCOUNTER — Encounter: Payer: Self-pay | Admitting: Cardiology

## 2023-08-20 VITALS — BP 171/73 | HR 52 | Ht 69.0 in | Wt 207.0 lb

## 2023-08-20 DIAGNOSIS — I480 Paroxysmal atrial fibrillation: Secondary | ICD-10-CM | POA: Diagnosis not present

## 2023-08-20 DIAGNOSIS — Z79899 Other long term (current) drug therapy: Secondary | ICD-10-CM | POA: Diagnosis not present

## 2023-08-20 DIAGNOSIS — I493 Ventricular premature depolarization: Secondary | ICD-10-CM | POA: Diagnosis not present

## 2023-08-20 NOTE — Patient Instructions (Signed)
 Medication Instructions:  Your physician recommends that you continue on your current medications as directed. Please refer to the Current Medication list given to you today.  *If you need a refill on your cardiac medications before your next appointment, please call your pharmacy*  Lab Work: Sotalol  surveillance labs today: BMET & Magnesium level  If you have any lab test that is abnormal or we need to change your treatment, we will call you to review the results.  Testing/Procedures: None ordered  Follow-Up: At [facility], you and your health needs are our priority.  As part of our continuing mission to provide you with exceptional heart care, our providers are all part of one team.  This team includes your primary Cardiologist (physician) and Advanced Practice Providers or APPs (Physician Assistants and Nurse Practitioners) who all work together to provide you with the care you need, when you need it.  Your next appointment:   6 month(s)  Provider:   You will see one of the following Advanced Practice Providers on your designated Care Team:   Charlies Arthur, PA-C Michael Andy Tillery, PA-C Daphne Barrack, NP   Thank you for choosing [facility]!!   Maeola Domino, RN [phone]   Other Instructions

## 2023-08-21 DIAGNOSIS — I493 Ventricular premature depolarization: Secondary | ICD-10-CM | POA: Diagnosis not present

## 2023-08-21 DIAGNOSIS — Z79899 Other long term (current) drug therapy: Secondary | ICD-10-CM | POA: Diagnosis not present

## 2023-08-21 DIAGNOSIS — I480 Paroxysmal atrial fibrillation: Secondary | ICD-10-CM | POA: Diagnosis not present

## 2023-08-22 ENCOUNTER — Ambulatory Visit: Payer: Self-pay | Admitting: *Deleted

## 2023-08-22 LAB — BASIC METABOLIC PANEL WITH GFR
BUN/Creatinine Ratio: 15 (ref 10–24)
BUN: 21 mg/dL (ref 8–27)
CO2: 23 mmol/L (ref 20–29)
Calcium: 9.7 mg/dL (ref 8.6–10.2)
Chloride: 100 mmol/L (ref 96–106)
Creatinine, Ser: 1.42 mg/dL — ABNORMAL HIGH (ref 0.76–1.27)
Glucose: 93 mg/dL (ref 70–99)
Potassium: 4.5 mmol/L (ref 3.5–5.2)
Sodium: 138 mmol/L (ref 134–144)
eGFR: 51 mL/min/1.73 — ABNORMAL LOW (ref >59)

## 2023-08-22 LAB — MAGNESIUM: Magnesium: 2.5 mg/dL — ABNORMAL HIGH (ref 1.6–2.3)

## 2023-08-26 ENCOUNTER — Other Ambulatory Visit: Payer: Self-pay | Admitting: Nurse Practitioner

## 2023-08-26 DIAGNOSIS — I480 Paroxysmal atrial fibrillation: Secondary | ICD-10-CM

## 2023-08-26 MED ORDER — APIXABAN 5 MG PO TABS: 5.0000 mg | ORAL_TABLET | Freq: Two times a day (BID) | ORAL | 5 refills | Status: AC

## 2023-08-26 NOTE — Telephone Encounter (Signed)
 Prescription refill request for Eliquis  received. Indication: a fib Last office visit: 08/20/23 Scr: 1.42 epic 03/13/1945 Age: 78 Weight: 93 kg

## 2023-08-28 ENCOUNTER — Other Ambulatory Visit: Payer: Self-pay | Admitting: Cardiology

## 2023-08-28 ENCOUNTER — Telehealth: Payer: Self-pay | Admitting: Cardiology

## 2023-08-28 MED ORDER — SOTALOL HCL 80 MG PO TABS: 80.0000 mg | ORAL_TABLET | Freq: Two times a day (BID) | ORAL | 1 refills | Status: AC

## 2023-08-28 NOTE — Telephone Encounter (Signed)
 RX sent in

## 2023-08-28 NOTE — Telephone Encounter (Signed)
*  STAT* If patient is at the pharmacy, call can be transferred to refill team.   1. Which medications need to be refilled? (please list name of each medication and dose if known)   sotalol  (BETAPACE ) 80 MG tablet   2. Would you like to learn more about the convenience, safety, & potential cost savings by using the [facility]?   3. Are you open to using the [facility] (Type [facility]. ).  4. Which pharmacy/location (including street and city if local pharmacy) is medication to be sent to?  [facility] [place], [place] - [address]   5. Do they need a 30 day or 90 day supply?   90 day  Patient stated he has 2 tablets left.  Patient noted he is going out of town tomorrow.

## 2023-09-06 DIAGNOSIS — N401 Enlarged prostate with lower urinary tract symptoms: Secondary | ICD-10-CM | POA: Diagnosis not present

## 2023-09-06 DIAGNOSIS — R3915 Urgency of urination: Secondary | ICD-10-CM | POA: Diagnosis not present

## 2023-09-06 DIAGNOSIS — N5201 Erectile dysfunction due to arterial insufficiency: Secondary | ICD-10-CM | POA: Diagnosis not present

## 2023-09-30 ENCOUNTER — Other Ambulatory Visit: Payer: Self-pay

## 2023-09-30 MED ORDER — ATORVASTATIN CALCIUM 40 MG PO TABS: 40.0000 mg | ORAL_TABLET | Freq: Every day | ORAL | 2 refills | Status: AC

## 2023-10-02 ENCOUNTER — Ambulatory Visit: Admitting: Cardiology

## 2023-10-07 ENCOUNTER — Other Ambulatory Visit: Payer: Self-pay | Admitting: Physician Assistant

## 2023-10-14 DIAGNOSIS — N401 Enlarged prostate with lower urinary tract symptoms: Secondary | ICD-10-CM | POA: Diagnosis not present

## 2023-11-25 ENCOUNTER — Ambulatory Visit: Payer: Self-pay | Admitting: Surgery

## 2023-11-26 ENCOUNTER — Telehealth (HOSPITAL_BASED_OUTPATIENT_CLINIC_OR_DEPARTMENT_OTHER): Payer: Self-pay

## 2023-11-26 DIAGNOSIS — Z01818 Encounter for other preprocedural examination: Secondary | ICD-10-CM

## 2023-11-26 NOTE — Telephone Encounter (Signed)
   Pre-operative Risk Assessment    Patient Name: Logan Sanders  DOB: 1945/08/24 MRN: [id number]   Date of last office visit: 08/20/23 with Dr. Inocencio Date of next office visit: NA   Request for Surgical Clearance    Procedure:  Hemorrhoidectomy   Date of Surgery:  Clearance TBD                                 Surgeon:  Dr. Sheldon Surgeon's Group or Practice Name:  [facility] Phone number:  [phone] Fax number:  [phone]   Type of Clearance Requested:   - Medical  - Pharmacy:  Hold Apixaban  (Eliquis ) not indicated   Type of Anesthesia:  General    Additional requests/questions:    Bonney Augustin JONETTA Delores   11/26/2023, 9:38 AM

## 2023-11-26 NOTE — Addendum Note (Signed)
 Addended by: Davionne Dowty M on: 11/26/2023 12:53 PM   Modules accepted: Orders

## 2023-11-26 NOTE — Telephone Encounter (Signed)
 Left message for the pt to call back as he is needing have CBC done. I will place the order and release to [facility]. Left message to call back and let us  know when going to have lab done so that we may be on loom out for the results to come in. The pharm-d will need to read the results and will await any further recommendations from preop APP if the pt will need appt.

## 2023-11-27 NOTE — Telephone Encounter (Addendum)
 S/w the pt and he said he will get CBC today at [facility]. Will update the preop APP and pharm-d about labs to be done today.    Pt did ask who was Dr. Alveta recommending as his new cardiologist since he retired. I stated I did not see anything in the last ov notes from Dr. Alveta.    I stated that I am going to see if our scheduling team may be able to help with that. Pt thanked me for the help.

## 2023-11-27 NOTE — Addendum Note (Signed)
 Addended by: WYNETTA NIELS HERO on: 11/27/2023 09:12 AM   Modules accepted: Orders

## 2023-11-28 LAB — CBC
Hematocrit: 44.6 % (ref 37.5–51.0)
Hemoglobin: 15 g/dL (ref 13.0–17.7)
MCH: 30 pg (ref 26.6–33.0)
MCHC: 33.6 g/dL (ref 31.5–35.7)
MCV: 89 fL (ref 79–97)
Platelets: 227 x10E3/uL (ref 150–450)
RBC: 5 x10E6/uL (ref 4.14–5.80)
RDW: 13.1 % (ref 11.6–15.4)
WBC: 5.6 x10E3/uL (ref 3.4–10.8)

## 2023-12-01 ENCOUNTER — Ambulatory Visit: Payer: Self-pay | Admitting: Cardiology

## 2023-12-01 NOTE — Telephone Encounter (Signed)
 Patient with diagnosis of atrial fibrillation on Eliquis  for anticoagulation.    Procedure:  Hemorrhoidectomy    Date of Surgery:  Clearance TBD     CHA2DS2-VASc Score = 5   This indicates a 7.2% annual risk of stroke. The patient's score is based upon: CHF History: 1 HTN History: 1 Diabetes History: 0 Stroke History: 0 Vascular Disease History: 1 Age Score: 2 Gender Score: 0     CrCl 59 Platelet count 227  Patient has not had an Afib/aflutter ablation in the last 3 months, DCCV within the last 4 weeks or a watchman implanted in the last 45 days    Per office protocol, patient can hold Eliquis  for 2 days prior to procedure.   Patient will not need bridging with Lovenox (enoxaparin) around procedure.  **This guidance is not considered finalized until pre-operative APP has relayed final recommendations.**

## 2023-12-02 NOTE — Telephone Encounter (Signed)
   Name: Logan Sanders  DOB: [birth year]  MRN: [id number]  Primary Cardiologist: None   Preoperative team, please contact this patient and set up a phone call appointment for further preoperative risk assessment. Please obtain consent and complete medication review. Thank you for your help.  I confirm that guidance regarding antiplatelet and oral anticoagulation therapy has been completed and, if necessary, noted below.  Per office protocol, patient can hold Eliquis  for 2 days prior to procedure.  Patient will not need bridging with Lovenox (enoxaparin) around procedure.    I also confirmed the patient resides in the state of [place] . As per [facility] Medical Board telemedicine laws, the patient must reside in the state in which the provider is licensed.   Lamarr Satterfield, NP 12/02/2023, 7:40 AM [facility]

## 2023-12-06 ENCOUNTER — Telehealth: Payer: Self-pay | Admitting: Cardiology

## 2023-12-06 NOTE — Telephone Encounter (Signed)
 Patient scheduled for pre-op clearance on 12/10/23 with Rosaline Bane, NP.     Patient Consent for Virtual Visit        Logan Sanders has provided verbal consent on 12/06/2023 for a virtual visit (video or telephone).   CONSENT FOR VIRTUAL VISIT FOR:  Logan Sanders  By participating in this virtual visit I agree to the following:  I hereby voluntarily request, consent and authorize [facility] and its employed or contracted physicians, physician assistants, nurse practitioners or other licensed health care professionals (the Practitioner), to provide me with telemedicine health care services (the "Services) as deemed necessary by the treating Practitioner. I acknowledge and consent to receive the Services by the Practitioner via telemedicine. I understand that the telemedicine visit will involve communicating with the Practitioner through live audiovisual communication technology and the disclosure of certain medical information by electronic transmission. I acknowledge that I have been given the opportunity to request an in-person assessment or other available alternative prior to the telemedicine visit and am voluntarily participating in the telemedicine visit.  I understand that I have the right to withhold or withdraw my consent to the use of telemedicine in the course of my care at any time, without affecting my right to future care or treatment, and that the Practitioner or I may terminate the telemedicine visit at any time. I understand that I have the right to inspect all information obtained and/or recorded in the course of the telemedicine visit and may receive copies of available information for a reasonable fee.  I understand that some of the potential risks of receiving the Services via telemedicine include:  Delay or interruption in medical evaluation due to technological equipment failure or disruption; Information transmitted may not be sufficient (e.g. poor  resolution of images) to allow for appropriate medical decision making by the Practitioner; and/or  In rare instances, security protocols could fail, causing a breach of personal health information.  Furthermore, I acknowledge that it is my responsibility to provide information about my medical history, conditions and care that is complete and accurate to the best of my ability. I acknowledge that Practitioner's advice, recommendations, and/or decision may be based on factors not within their control, such as incomplete or inaccurate data provided by me or distortions of diagnostic images or specimens that may result from electronic transmissions. I understand that the practice of medicine is not an exact science and that Practitioner makes no warranties or guarantees regarding treatment outcomes. I acknowledge that a copy of this consent can be made available to me via my patient portal [facility] MyChart), or I can request a printed copy by calling the office of [facility].    I understand that my insurance will be billed for this visit.   I have read or had this consent read to me. I understand the contents of this consent, which adequately explains the benefits and risks of the Services being provided via telemedicine.  I have been provided ample opportunity to ask questions regarding this consent and the Services and have had my questions answered to my satisfaction. I give my informed consent for the services to be provided through the use of telemedicine in my medical care

## 2023-12-06 NOTE — Telephone Encounter (Signed)
 Patient called to follow-up on the status of his clearance.

## 2023-12-10 ENCOUNTER — Ambulatory Visit: Attending: Cardiology

## 2023-12-10 DIAGNOSIS — Z01810 Encounter for preprocedural cardiovascular examination: Secondary | ICD-10-CM

## 2023-12-10 NOTE — Progress Notes (Signed)
 Virtual Visit via Telephone Note   Because of Logan Sanders co-morbid illnesses, he is at least at moderate risk for complications without adequate follow up.  This format is felt to be most appropriate for this patient at this time.  Due to technical limitations with video connection (technology), today's appointment will be conducted as an audio only telehealth visit, and Logan Sanders verbally agreed to proceed in this manner.   All issues noted in this document were discussed and addressed.  No physical exam could be performed with this format.  Evaluation Performed:  Preoperative cardiovascular risk assessment _____________   Date:  12/10/2023   Patient ID:  Logan Sanders, DOB [birth year], MRN [id number] Patient Location:  Home Provider location:   Office  Primary Care Provider:  No primary care provider on file. Primary Cardiologist:  None  Chief Complaint / Patient Profile   78 y.o. y/o male with a h/o PVCs, atrial fibrillation on chronic anticoagulation, nonobstructive CAD, HTN, HLD, hypothyroidism, NICM thought to be secondary to PVCs who is pending hemorrhoidectomy and presents today for telephonic preoperative cardiovascular risk assessment.  History of Present Illness    Logan Sanders is a 78 y.o. male who presents via audio/video conferencing for a telehealth visit today.  Pt was last seen in cardiology clinic on 08/20/23 by Dr. Inocencio.  At that time Logan Sanders was doing well.  The patient is now pending procedure as outlined above. Since his last visit, he  denies chest pain, shortness of breath, lower extremity edema, fatigue, palpitations, melena, hematuria, hemoptysis, diaphoresis, weakness, presyncope, syncope, orthopnea, and PND. 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 He is able to achieve > 4 METS activity without concerning cardiac symptoms.   Past Medical History    Past Medical History:   Diagnosis Date   BPH (benign prostatic hyperplasia)    CAD in native artery    Complication of anesthesia    gets really anxious when waking up   ED (erectile dysfunction)    Hyperlipidemia    Hypertension    Hypothyroidism    Osteoarthritis    PVC's (premature ventricular contractions)    3-day cardiac monitor 07/2018: NSR, frequent PVCs (PVC burden 15.9%); rare PACs and rare episode of non-sustained SVT  //  Echo 08/2018: EF 55-60, mild LVH, normal RV SF, mild LAE, mild dilation of aortic root and ascending aorta (38 mm)    Thyroid  disease    Past Surgical History:  Procedure Laterality Date   CARDIAC CATHETERIZATION  11/09/13   non obstructive disease in all vessels.   COLONOSCOPY     KNEE ARTHROSCOPY Left    x3   LEFT HEART CATHETERIZATION WITH CORONARY ANGIOGRAM N/A 11/09/2013   Procedure: LEFT HEART CATHETERIZATION WITH CORONARY ANGIOGRAM;  Surgeon: Ozell JONETTA Fell, MD;  Location: [facility] CATH LAB;  Service: Cardiovascular;  Laterality: N/A;   LOWER BACK SURGERY  1985   LUMBAR LAMINECTOMY/DECOMPRESSION MICRODISCECTOMY N/A 12/18/2017   Procedure: LAMINECTOMY AND FORAMINOTOMY LUMBAR TWO- LUMBAR THREE, LUMBAR THREE- LUMBAR FOUR, LUMBAR FOUR- LUMBAR FIVE;  Surgeon: Mavis Purchase, MD;  Location: [facility] OR;  Service: Neurosurgery;  Laterality: N/A;   NECK SURGERY  1999   PVC ABLATION N/A 10/07/2018   Procedure: PVC ABLATION;  Surgeon: Inocencio Soyla Lunger, MD;  Location: [facility] INVASIVE CV LAB;  Service: Cardiovascular;  Laterality: N/A;   REPLACEMENT TOTAL KNEE  2005   LEFT KNEE   WISDOM  TOOTH EXTRACTION      Allergies  No Known Allergies  Home Medications    Prior to Admission medications   Medication Sig Start Date End Date Taking? Authorizing Provider  amLODipine -olmesartan  (AZOR ) 10-40 MG tablet Take 1 tablet by mouth daily. 03/14/23   Nahser, Aleene PARAS, MD  apixaban  (ELIQUIS ) 5 MG TABS tablet Take 1 tablet (5 mg total) by mouth 2 (two) times daily. 08/26/23   Alessander Sikorski, Logan HERO,  NP  arginine 500 MG tablet Take 500 mg by mouth daily. Patient not taking: Reported on 12/06/2023    [provider]  atorvastatin  (LIPITOR) 40 MG tablet Take 1 tablet (40 mg total) by mouth daily. 09/30/23   Camnitz, Soyla Lunger, MD  chlorthalidone  (HYGROTON ) 25 MG tablet Take 1 tablet (25 mg total) by mouth daily. 10/08/23   Camnitz, Will Lunger, MD  Cholecalciferol (D3-1000) 25 MCG (1000 UT) tablet Take 1,000 Units by mouth daily. Patient not taking: Reported on 12/06/2023    [provider]  Co-Enzyme Q-10 100 MG CAPS Take 100 mg by mouth daily.     [provider]  Cyanocobalamin (VITAMIN B-12 PO) Take 1 tablet by mouth daily. Patient not taking: Reported on 12/06/2023    [provider]  diazepam  (VALIUM ) 5 MG tablet Take 5 mg by mouth as needed. 11/11/19   [provider]  doxazosin  (CARDURA ) 4 MG tablet TAKE ONE TABLET BY MOUTH TWICE DAILY 04/01/23   Nahser, Aleene PARAS, MD  fexofenadine (ALLEGRA) 180 MG tablet Take 180 mg by mouth daily as needed for allergies.     [provider]  multivitamin-iron-minerals-folic acid (CENTRUM) chewable tablet Chew 1 tablet by mouth daily. Patient not taking: Reported on 12/06/2023    [provider]  Omega-3 Fatty Acids (FISH OIL PO) Take 1,000 mg by mouth daily.    [provider]  omeprazole (PRILOSEC) 40 MG capsule Take 40 mg by mouth daily as needed (for acid reflux).    [provider]  OVER THE COUNTER MEDICATION Take 1 tablet by mouth daily. Super Beet supplement    [provider]  potassium chloride  (KLOR-CON ) 10 MEQ tablet TAKE 1 TABLET EACH DAY. 04/01/23   Nahser, Aleene PARAS, MD  sotalol  (BETAPACE ) 80 MG tablet Take 1 tablet (80 mg total) by mouth 2 (two) times daily. 08/28/23   Antavius Sperbeck, Logan HERO, NP  SYNTHROID  100 MCG tablet Take 100 mcg by mouth daily before breakfast.  09/28/13   [provider]  traZODone (DESYREL) 50 MG tablet Take 50 mg by mouth at  bedtime.    [provider]  zolpidem (AMBIEN) 10 MG tablet Take 10 mg by mouth at bedtime. 09/10/13   [provider]    Physical Exam    Vital Signs:  Logan Sanders does not have vital signs available for review today.  Given telephonic nature of communication, physical exam is limited. AAOx3. NAD. Normal affect.  Speech and respirations are unlabored.  Accessory Clinical Findings    None  Assessment & Plan    1.  Preoperative Cardiovascular Risk Assessment: According to the Revised Cardiac Risk Index (RCRI), his Perioperative Risk of Major Cardiac Event is (%): 0.9. His Functional Capacity in METs is: 7.59 according to the Duke Activity Status Index (DASI). The patient is doing well from a cardiac perspective. Therefore, based on ACC/AHA guidelines, the patient would be at acceptable risk for the planned procedure without further cardiovascular testing.   The patient was advised that if he  develops new symptoms prior to surgery to contact our office to arrange for a follow-up visit, and he verbalized understanding.  Per office protocol, he may hold Eliquis  for 2 days prior to procedure and should resume as soon as hemodynamically stable postoperatively.  A copy of this note will be routed to requesting surgeon.  Time:   Today, I have spent 10 minutes with the patient with telehealth technology discussing medical history, symptoms, and management plan.     Logan EMERSON Bane, NP-C  12/10/2023, 9:23 AM [address], [address] [place], [place] [address] Office [phone] Fax [phone]

## 2023-12-20 ENCOUNTER — Emergency Department (HOSPITAL_COMMUNITY)
Admission: EM | Admit: 2023-12-20 | Discharge: 2023-12-21 | Disposition: A | Discharge status: Home / Self Care | Attending: Emergency Medicine | Admitting: Emergency Medicine

## 2023-12-20 ENCOUNTER — Other Ambulatory Visit: Payer: Self-pay

## 2023-12-20 ENCOUNTER — Encounter (HOSPITAL_COMMUNITY): Payer: Self-pay

## 2023-12-20 ENCOUNTER — Emergency Department (HOSPITAL_COMMUNITY)

## 2023-12-20 DIAGNOSIS — Z7901 Long term (current) use of anticoagulants: Secondary | ICD-10-CM | POA: Diagnosis not present

## 2023-12-20 DIAGNOSIS — I4891 Unspecified atrial fibrillation: Secondary | ICD-10-CM | POA: Insufficient documentation

## 2023-12-20 DIAGNOSIS — I10 Essential (primary) hypertension: Secondary | ICD-10-CM | POA: Insufficient documentation

## 2023-12-20 DIAGNOSIS — N3000 Acute cystitis without hematuria: Secondary | ICD-10-CM | POA: Diagnosis not present

## 2023-12-20 DIAGNOSIS — S300XXA Contusion of lower back and pelvis, initial encounter: Secondary | ICD-10-CM | POA: Insufficient documentation

## 2023-12-20 DIAGNOSIS — S70211A Abrasion, right hip, initial encounter: Secondary | ICD-10-CM | POA: Insufficient documentation

## 2023-12-20 DIAGNOSIS — Z79899 Other long term (current) drug therapy: Secondary | ICD-10-CM | POA: Diagnosis not present

## 2023-12-20 DIAGNOSIS — I2510 Atherosclerotic heart disease of native coronary artery without angina pectoris: Secondary | ICD-10-CM | POA: Insufficient documentation

## 2023-12-20 DIAGNOSIS — R509 Fever, unspecified: Secondary | ICD-10-CM | POA: Diagnosis not present

## 2023-12-20 DIAGNOSIS — W01198A Fall on same level from slipping, tripping and stumbling with subsequent striking against other object, initial encounter: Secondary | ICD-10-CM | POA: Diagnosis not present

## 2023-12-20 DIAGNOSIS — S303XXA Contusion of anus, initial encounter: Secondary | ICD-10-CM | POA: Insufficient documentation

## 2023-12-20 DIAGNOSIS — Y92002 Bathroom of unspecified non-institutional (private) residence single-family (private) house as the place of occurrence of the external cause: Secondary | ICD-10-CM | POA: Insufficient documentation

## 2023-12-20 DIAGNOSIS — M545 Low back pain, unspecified: Secondary | ICD-10-CM | POA: Diagnosis present

## 2023-12-20 LAB — CBC WITH DIFFERENTIAL/PLATELET
Abs Immature Granulocytes: 0.05 K/uL (ref 0.00–0.07)
Basophils Absolute: 0 K/uL (ref 0.0–0.1)
Basophils Relative: 0 %
Eosinophils Absolute: 0.1 K/uL (ref 0.0–0.5)
Eosinophils Relative: 1 %
HCT: 38.8 % — ABNORMAL LOW (ref 39.0–52.0)
Hemoglobin: 13.4 g/dL (ref 13.0–17.0)
Immature Granulocytes: 1 %
Lymphocytes Relative: 6 %
Lymphs Abs: 0.6 K/uL — ABNORMAL LOW (ref 0.7–4.0)
MCH: 30.8 pg (ref 26.0–34.0)
MCHC: 34.5 g/dL (ref 30.0–36.0)
MCV: 89.2 fL (ref 80.0–100.0)
Monocytes Absolute: 0.7 K/uL (ref 0.1–1.0)
Monocytes Relative: 7 %
Neutro Abs: 7.9 K/uL — ABNORMAL HIGH (ref 1.7–7.7)
Neutrophils Relative %: 85 %
Platelets: 157 K/uL (ref 150–400)
RBC: 4.35 MIL/uL (ref 4.22–5.81)
RDW: 13.4 % (ref 11.5–15.5)
WBC: 9.4 K/uL (ref 4.0–10.5)
nRBC: 0 % (ref 0.0–0.2)

## 2023-12-20 LAB — I-STAT CG4 LACTIC ACID, ED: Lactic Acid, Venous: 1.8 mmol/L (ref 0.5–1.9)

## 2023-12-20 LAB — PROTIME-INR
INR: 1.2 (ref 0.8–1.2)
Prothrombin Time: 16 s — ABNORMAL HIGH (ref 11.4–15.2)

## 2023-12-20 NOTE — ED Triage Notes (Signed)
 Patient is coming from home. Patient had hemorrhoid surgery this past Wednesday. Patient stopped taking his Eliquis , restarted taking it today. Patient had a mechanical fall trying to put on his depends. Patient reported hitting his shoulder on the wall and falling on his bottom. Denies hitting his head or LOC. Hx of Afib. EMS VS 134/60 BP 95% RA 80 HR

## 2023-12-20 NOTE — ED Provider Notes (Signed)
 " [place] EM[facility] Provider Note   CSN: [id number] Arrival date & time: 12/20/23  2317     Patient presents with: fall on thinners   Logan Sanders is a 78 y.o. male.  {Add pertinent medical, surgical, social history, OB history to HPI:32947} HPI     Prior to Admission medications  Medication Sig Start Date End Date Taking? Authorizing Provider  amLODipine -olmesartan  (AZOR ) 10-40 MG tablet Take 1 tablet by mouth daily. 03/14/23   Nahser, Aleene PARAS, MD  apixaban  (ELIQUIS ) 5 MG TABS tablet Take 1 tablet (5 mg total) by mouth 2 (two) times daily. 08/26/23   Swinyer, Rosaline HERO, NP  arginine 500 MG tablet Take 500 mg by mouth daily. Patient not taking: Reported on 12/06/2023    [provider]  atorvastatin  (LIPITOR) 40 MG tablet Take 1 tablet (40 mg total) by mouth daily. 09/30/23   Camnitz, Soyla Lunger, MD  chlorthalidone  (HYGROTON ) 25 MG tablet Take 1 tablet (25 mg total) by mouth daily. 10/08/23   Camnitz, Will Lunger, MD  Cholecalciferol (D3-1000) 25 MCG (1000 UT) tablet Take 1,000 Units by mouth daily. Patient not taking: Reported on 12/06/2023    [provider]  Co-Enzyme Q-10 100 MG CAPS Take 100 mg by mouth daily.     [provider]  Cyanocobalamin (VITAMIN B-12 PO) Take 1 tablet by mouth daily. Patient not taking: Reported on 12/06/2023    [provider]  diazepam  (VALIUM ) 5 MG tablet Take 5 mg by mouth as needed. 11/11/19   [provider]  doxazosin  (CARDURA ) 4 MG tablet TAKE ONE TABLET BY MOUTH TWICE DAILY 04/01/23   Nahser, Aleene PARAS, MD  fexofenadine (ALLEGRA) 180 MG tablet Take 180 mg by mouth daily as needed for allergies.     [provider]  multivitamin-iron-minerals-folic acid (CENTRUM) chewable tablet Chew 1 tablet by mouth daily. Patient not taking: Reported on 12/06/2023    [provider]  Omega-3 Fatty Acids (FISH OIL PO) Take 1,000 mg by mouth daily.    [provider]  omeprazole (PRILOSEC) 40 MG capsule Take 40 mg by mouth daily as needed (for acid reflux).    [provider]  OVER THE COUNTER MEDICATION Take 1 tablet by mouth daily. Super Beet supplement    [provider]  potassium chloride  (KLOR-CON ) 10 MEQ tablet TAKE 1 TABLET EACH DAY. 04/01/23   Nahser, Aleene PARAS, MD  sotalol  (BETAPACE ) 80 MG tablet Take 1 tablet (80 mg total) by mouth 2 (two) times daily. 08/28/23   Swinyer, Rosaline HERO, NP  SYNTHROID  100 MCG tablet Take 100 mcg by mouth daily before breakfast.  09/28/13   [provider]  traZODone (DESYREL) 50 MG tablet Take 50 mg by mouth at bedtime.    [provider]  zolpidem (AMBIEN) 10 MG tablet Take 10 mg by mouth at bedtime. 09/10/13   [provider]    Allergies: Patient has no known allergies.    Review of Systems  Updated Vital Signs BP (!) 140/60   Temp (!) 101.2 F (38.4 C)   Physical Exam  (all labs ordered are listed, but only abnormal results are displayed) Labs Reviewed - No data to display  EKG: None  Radiology: No results found.  {Document cardiac monitor, telemetry assessment procedure when appropriate:32947} Procedures   Medications Ordered in the ED - No data to display    {Click here for ABCD2, HEART and other calculators REFRESH Note before  signing:1}                              Medical Decision Making  ***  {Document critical care time when appropriate  Document review of labs and clinical decision tools ie CHADS2VASC2, etc  Document your independent review of radiology images and any outside records  Document your discussion with family members, caretakers and with consultants  Document social determinants of health affecting pt's care  Document your decision making why or why not admission, treatments were needed:32947:::1}   Final diagnoses:  None    ED Discharge Orders     None        "

## 2023-12-20 NOTE — Progress Notes (Signed)
 Orthopedic Tech Progress Note Patient Details:  Logan Sanders [birth year] [id number] LV2T FOT. No orders at this time. Patient ID: Logan Sanders, male   DOB: 04/30/1945, 78 y.o.   MRN: [id number]  [name] 12/20/2023, 11:35 PM

## 2023-12-21 ENCOUNTER — Emergency Department (HOSPITAL_COMMUNITY)

## 2023-12-21 LAB — RESP PANEL BY RT-PCR (RSV, FLU A&B, COVID)  RVPGX2
Influenza A by PCR: NEGATIVE
Influenza B by PCR: NEGATIVE
Resp Syncytial Virus by PCR: NEGATIVE
SARS Coronavirus 2 by RT PCR: NEGATIVE

## 2023-12-21 LAB — COMPREHENSIVE METABOLIC PANEL WITH GFR
ALT: 18 U/L (ref 0–44)
AST: 22 U/L (ref 15–41)
Albumin: 3.3 g/dL — ABNORMAL LOW (ref 3.5–5.0)
Alkaline Phosphatase: 71 U/L (ref 38–126)
Anion gap: 8 (ref 5–15)
BUN: 16 mg/dL (ref 8–23)
CO2: 23 mmol/L (ref 22–32)
Calcium: 8.6 mg/dL — ABNORMAL LOW (ref 8.9–10.3)
Chloride: 108 mmol/L (ref 98–111)
Creatinine, Ser: 1.72 mg/dL — ABNORMAL HIGH (ref 0.61–1.24)
GFR, Estimated: 40 mL/min — ABNORMAL LOW
Glucose, Bld: 115 mg/dL — ABNORMAL HIGH (ref 70–99)
Potassium: 3.6 mmol/L (ref 3.5–5.1)
Sodium: 139 mmol/L (ref 135–145)
Total Bilirubin: 0.6 mg/dL (ref 0.0–1.2)
Total Protein: 5.9 g/dL — ABNORMAL LOW (ref 6.5–8.1)

## 2023-12-21 LAB — URINALYSIS, W/ REFLEX TO CULTURE (INFECTION SUSPECTED)
Bilirubin Urine: NEGATIVE
Glucose, UA: NEGATIVE mg/dL
Ketones, ur: NEGATIVE mg/dL
Nitrite: POSITIVE — AB
Protein, ur: 30 mg/dL — AB
RBC / HPF: >50 RBC/hpf (ref 0–5)
Specific Gravity, Urine: 1.01 (ref 1.005–1.030)
WBC, UA: >50 WBC/hpf (ref 0–5)
pH: 6 (ref 5.0–8.0)

## 2023-12-21 MED ORDER — IOHEXOL 350 MG/ML SOLN
75.0000 mL | Freq: Once | INTRAVENOUS | Status: AC | PRN
  Administered 2023-12-21: 75 mL via INTRAVENOUS

## 2023-12-21 MED ORDER — FENTANYL CITRATE (PF) 50 MCG/ML IJ SOSY
50.0000 ug | PREFILLED_SYRINGE | Freq: Once | INTRAMUSCULAR | Status: AC
  Administered 2023-12-21: 50 ug via INTRAVENOUS
  Filled 2023-12-21: qty 1

## 2023-12-21 MED ORDER — CEFUROXIME AXETIL 500 MG PO TABS: 500.0000 mg | ORAL_TABLET | Freq: Two times a day (BID) | ORAL | 0 refills | Status: AC

## 2023-12-21 MED ORDER — SODIUM CHLORIDE 0.9 % IV BOLUS
1000.0000 mL | Freq: Once | INTRAVENOUS | Status: AC
  Administered 2023-12-21: 1000 mL via INTRAVENOUS

## 2023-12-21 MED ORDER — ACETAMINOPHEN 325 MG PO TABS
650.0000 mg | ORAL_TABLET | Freq: Once | ORAL | Status: AC
  Administered 2023-12-21: 650 mg via ORAL
  Filled 2023-12-21: qty 2

## 2023-12-21 MED ORDER — PIPERACILLIN-TAZOBACTAM 3.375 G IVPB 30 MIN
3.3750 g | Freq: Once | INTRAVENOUS | Status: AC
  Administered 2023-12-21: 3.375 g via INTRAVENOUS
  Filled 2023-12-21: qty 50

## 2023-12-21 MED ORDER — FENTANYL CITRATE (PF) 50 MCG/ML IJ SOSY
100.0000 ug | PREFILLED_SYRINGE | Freq: Once | INTRAMUSCULAR | Status: AC
  Administered 2023-12-21: 100 ug via INTRAVENOUS
  Filled 2023-12-21: qty 2

## 2023-12-21 NOTE — Discharge Instructions (Signed)
 You were seen in the ER today after your fall.  Your blood work and CT scan were very reassuring, however you were found of a fever and a urinary tract infection for which you have been started on antibiotics.  Please take the antibiotics as prescribed for the entire course and follow-up with your primary care doctor for recheck of your urine studies and your kidney function in 1 week.  Follow-up with your surgeon as previously scheduled and return to the ER with any new severe symptoms.

## 2023-12-21 NOTE — ED Notes (Signed)
 Trauma Response Nurse Documentation   KAYD LAUNER is a 78 y.o. male arriving to [facility] ED via EMS  On Eliquis  (apixaban ) daily. Trauma was activated as a Level 2 by ED charge RN based on the following trauma criteria Elderly patients > 65 with head trauma on anti-coagulation (excluding ASA).   Downgraded to a non trauma after arrival 2329 - pt alert x4 and clearly states that he did not hit his head.  History   Past Medical History:  Diagnosis Date   BPH (benign prostatic hyperplasia)    CAD in native artery    Complication of anesthesia    gets really anxious when waking up   ED (erectile dysfunction)    Hyperlipidemia    Hypertension    Hypothyroidism    Osteoarthritis    PVC's (premature ventricular contractions)    3-day cardiac monitor 07/2018: NSR, frequent PVCs (PVC burden 15.9%); rare PACs and rare episode of non-sustained SVT  //  Echo 08/2018: EF 55-60, mild LVH, normal RV SF, mild LAE, mild dilation of aortic root and ascending aorta (38 mm)    Thyroid  disease      Past Surgical History:  Procedure Laterality Date   CARDIAC CATHETERIZATION  11/09/13   non obstructive disease in all vessels.   COLONOSCOPY     KNEE ARTHROSCOPY Left    x3   LEFT HEART CATHETERIZATION WITH CORONARY ANGIOGRAM N/A 11/09/2013   Procedure: LEFT HEART CATHETERIZATION WITH CORONARY ANGIOGRAM;  Surgeon: Ozell JONETTA Fell, MD;  Location: [facility] CATH LAB;  Service: Cardiovascular;  Laterality: N/A;   LOWER BACK SURGERY  1985   LUMBAR LAMINECTOMY/DECOMPRESSION MICRODISCECTOMY N/A 12/18/2017   Procedure: LAMINECTOMY AND FORAMINOTOMY LUMBAR TWO- LUMBAR THREE, LUMBAR THREE- LUMBAR FOUR, LUMBAR FOUR- LUMBAR FIVE;  Surgeon: Mavis Purchase, MD;  Location: [facility] OR;  Service: Neurosurgery;  Laterality: N/A;   NECK SURGERY  1999   PVC ABLATION N/A 10/07/2018   Procedure: PVC ABLATION;  Surgeon: Inocencio Soyla Lunger, MD;  Location: [facility] INVASIVE CV LAB;  Service: Cardiovascular;  Laterality: N/A;    REPLACEMENT TOTAL KNEE  2005   LEFT KNEE   WISDOM TOOTH EXTRACTION         Event Summary: Presents via EMS from home after a fall in the bathroom, states he was trying to get on a depends and fell, striking his R side on the sink and landing on his bottom. States he did not hit his head. Pt s/p post op hemorrhoid surgery, some bleeding noted - assisted PA with external exam. Bruising and abrasions to R lower rib cage/flank from tonight's fall. Downgraded to non trauma d/t not hitting his head.   Bedside handoff with ED RN Janeth.    Tamicka Shimon O Hersel Mcmeen  Trauma Response RN  Please call TRN at [phone] for further assistance.

## 2023-12-22 LAB — BLOOD CULTURE ID PANEL (REFLEXED) - BCID2

## 2023-12-23 LAB — CULTURE, BLOOD (ROUTINE X 2)

## 2023-12-23 LAB — URINE CULTURE: Culture: >=100000 — AB

## 2023-12-24 ENCOUNTER — Telehealth (HOSPITAL_BASED_OUTPATIENT_CLINIC_OR_DEPARTMENT_OTHER): Payer: Self-pay | Admitting: *Deleted

## 2023-12-24 NOTE — Telephone Encounter (Signed)
 Post ED Visit - Positive Culture Follow-up  Culture report reviewed by antimicrobial stewardship pharmacist: [facility] [x]  Leonor Bash, [place].D. []  Venetia Gully, Pharm.D., BCPS AQ-ID []  Garrel Crews, Pharm.D., BCPS []  Almarie Lunger, Pharm.D., BCPS []  [place], [place].D., BCPS, AAHIVP []  Rosaline Bihari, Pharm.D., BCPS, AAHIVP []  Vernell Meier, PharmD, BCPS []  Latanya Hint, PharmD, BCPS []  Donald Medley, PharmD, BCPS []  Rocky Bold, PharmD []  Dorothyann Alert, PharmD, BCPS []  Morene Babe, PharmD  [facility] []  Rosaline Edison, PharmD []  Romona Bliss, PharmD []  Dolphus Roller, PharmD []  Veva Seip, Rph []  Vernell Daunt) Leonce, PharmD []  Eva Allis, PharmD []  Rosaline Millet, PharmD []  Iantha Batch, PharmD []  Arvin Gauss, PharmD []  Wanda Hasting, PharmD []  Ronal Rav, PharmD []  Rocky Slade, PharmD []  Bard Jeans, PharmD  Called patient to do symptom check/ Patient has been afebrile and denies andy urinary symptoms Positive Wyder [place]-PA-c culture Treated with cefuroxime , organism sensitive to the same and no further patient follow-up is required at this time.  Logan Sanders 12/24/2023, 11:24 AM

## 2023-12-25 LAB — CULTURE, BLOOD (ROUTINE X 2): Culture: NO GROWTH

## 2024-01-03 ENCOUNTER — Telehealth (HOSPITAL_BASED_OUTPATIENT_CLINIC_OR_DEPARTMENT_OTHER): Payer: Self-pay | Admitting: *Deleted

## 2024-03-09 ENCOUNTER — Ambulatory Visit: Admitting: Internal Medicine
